# Patient Record
Sex: Female | Born: 1975 | Race: Black or African American | Hispanic: No | Smoking: Never smoker
Health system: Southern US, Community
[De-identification: ages and names within clinical notes are randomized; demographics above are authoritative.]

## PROBLEM LIST (undated history)

## (undated) DIAGNOSIS — I10 Essential (primary) hypertension: Secondary | ICD-10-CM

## (undated) DIAGNOSIS — I639 Cerebral infarction, unspecified: Secondary | ICD-10-CM

## (undated) DIAGNOSIS — E049 Nontoxic goiter, unspecified: Secondary | ICD-10-CM

## (undated) DIAGNOSIS — M545 Low back pain, unspecified: Secondary | ICD-10-CM

## (undated) DIAGNOSIS — D649 Anemia, unspecified: Secondary | ICD-10-CM

## (undated) HISTORY — PX: THYROIDECTOMY: SHX17

---

## 2019-10-09 ENCOUNTER — Ambulatory Visit
Admission: EM | Admit: 2019-10-09 | Discharge: 2019-10-09 | Disposition: A | Discharge status: Home / Self Care | Payer: Medicaid Other | Attending: Family Medicine | Admitting: Family Medicine

## 2019-10-09 ENCOUNTER — Other Ambulatory Visit: Payer: Self-pay

## 2019-10-09 ENCOUNTER — Encounter: Payer: Self-pay | Admitting: Emergency Medicine

## 2019-10-09 DIAGNOSIS — H11431 Conjunctival hyperemia, right eye: Secondary | ICD-10-CM

## 2019-10-09 HISTORY — DX: Essential (primary) hypertension: I10

## 2019-10-09 HISTORY — DX: Cerebral infarction, unspecified: I63.9

## 2019-10-09 MED ORDER — OLOPATADINE HCL 0.1 % OP SOLN: 1.0000 [drp] | Freq: Two times a day (BID) | OPHTHALMIC | 0 refills | Status: DC

## 2019-10-09 MED ORDER — POLYMYXIN B-TRIMETHOPRIM 10000-0.1 UNIT/ML-% OP SOLN: 1.0000 [drp] | Freq: Four times a day (QID) | OPHTHALMIC | 0 refills | Status: AC

## 2019-10-09 NOTE — ED Provider Notes (Signed)
[facility]    CSN: [id number] Arrival date & time: 10/09/19  1128      History   Chief Complaint Chief Complaint  Patient presents with  . Eye Problem   HPI  44 year old female presents with the above complaint.  Patient reports that this started on Wednesday.  She reports an area of redness of her left eye.  She reports intermittent watering.  Some mild itching.  No significant pain.  No vision changes.  No relieving factors.  No known inciting factor.  No known exacerbating factors.  No other complaints.  Past Medical History:  Diagnosis Date  . Hypertension   . Stroke [facility])     Past Surgical History:  Procedure Laterality Date  . CESAREAN SECTION    . THYROIDECTOMY      OB History   No obstetric history on file.      Home Medications    Prior to Admission medications   Medication Sig Start Date End Date Taking? Authorizing Provider  aspirin 81 MG EC tablet Take by mouth.   Yes [provider]  ergocalciferol (VITAMIN D2) 1.25 MG (50000 UT) capsule Take by mouth.   Yes [provider]  metoprolol succinate (TOPROL-XL) 50 MG 24 hr tablet SMARTSIG:1 Tablet(s) By Mouth Twice Daily 08/28/19  Yes [provider]  omeprazole (PRILOSEC) 20 MG capsule Take by mouth.   Yes [provider]  spironolactone (ALDACTONE) 50 MG tablet Take 50 mg by mouth daily. 09/29/19  Yes [provider]  olopatadine (PATADAY) 0.1 % ophthalmic solution Place 1 drop into the right eye 2 (two) times daily. 10/09/19   Coral Spikes, DO  trimethoprim-polymyxin b (POLYTRIM) ophthalmic solution Place 1 drop into both eyes every 6 (six) hours for 5 days. 10/09/19 10/14/19  Coral Spikes, DO    Family History Family History  Problem Relation Age of Onset  . Healthy Mother     Social History Social History   Tobacco Use  . Smoking status: Never Smoker  . Smokeless tobacco: Never Used  Vaping Use  . Vaping Use: Never used  Substance Use  Topics  . Alcohol use: Not Currently  . Drug use: Not Currently     Allergies   Patient has no known allergies.   Review of Systems Review of Systems  Constitutional: Negative.   Eyes: Positive for redness and itching.   Physical Exam Triage Vital Signs ED Triage Vitals  Enc Vitals Group     BP 10/09/19 1213 (!) 175/100     Pulse Rate 10/09/19 1213 79     Resp 10/09/19 1213 18     Temp 10/09/19 1213 98.5 F (36.9 C)     Temp Source 10/09/19 1213 Oral     SpO2 10/09/19 1213 98 %     Weight 10/09/19 1207 250 lb (113.4 kg)     Height 10/09/19 1207 5\' 5"  (1.651 m)     Head Circumference --      Peak Flow --      Pain Score 10/09/19 1207 0     Pain Loc --      Pain Edu? --      Excl. in [place]? --    No data found.  Updated Vital Signs BP (!) 175/100 (BP Location: Right Arm)   Pulse 79   Temp 98.5 F (36.9 C) (Oral)   Resp 18   Ht 5\' 5"  (1.651 m)   Wt 113.4 kg   LMP 10/03/2019 (Approximate)  SpO2 98%   BMI 41.60 kg/m   Visual Acuity Right Eye Distance:   Left Eye Distance:   Bilateral Distance:    Right Eye Near:   Left Eye Near:    Bilateral Near:     Physical Exam Constitutional:      General: She is not in acute distress.    Appearance: Normal appearance. She is obese. She is not ill-appearing.  HENT:     Head: Normocephalic and atraumatic.  Eyes:     Pupils: Pupils are equal, round, and reactive to light.      Comments: Mild conjunctival injection at the level location.  Cardiovascular:     Rate and Rhythm: Normal rate and regular rhythm.  Pulmonary:     Effort: Pulmonary effort is normal. No respiratory distress.  Neurological:     Mental Status: She is alert.  Psychiatric:        Mood and Affect: Mood normal.        Behavior: Behavior normal.    UC Treatments / Results  Labs (all labs ordered are listed, but only abnormal results are displayed) Labs Reviewed - No data to display  EKG   Radiology No results  found.  Procedures Procedures (including critical care time)  Medications Ordered in UC Medications - No data to display  Initial Impression / Assessment and Plan / UC Course  I have reviewed the triage vital signs and the nursing notes.  Pertinent labs & imaging results that were available during my care of the patient were reviewed by me and considered in my medical decision making (see chart for details).    44 year old female presents with conjunctival injection.  Does not appear to be consistent with subconjunctival hemorrhage.  Placing empirically on Polytrim and Pataday.  Final Clinical Impressions(s) / UC Diagnoses   Final diagnoses:  Conjunctival hyperemia of right eye     Discharge Instructions     Medication as prescribed.  If persists, see [facility].  Take care  Dr. Adriana Simas    ED Prescriptions    Medication Sig Dispense Auth. Provider   olopatadine (PATADAY) 0.1 % ophthalmic solution Place 1 drop into the right eye 2 (two) times daily. 5 mL Tajah Noguchi G, DO   trimethoprim-polymyxin b (POLYTRIM) ophthalmic solution Place 1 drop into both eyes every 6 (six) hours for 5 days. 10 mL Tommie Sams, DO     PDMP not reviewed this encounter.   Tommie Sams, [place] 10/09/19 2136

## 2019-10-09 NOTE — ED Triage Notes (Signed)
Pt c/o left eye redness, watery and mild itching. Started about a week ago but gotten worse. Denies pain.

## 2019-10-09 NOTE — Discharge Instructions (Addendum)
Medication as prescribed.  If persists, see [facility].  Take care  Dr. Adriana Simas

## 2019-12-18 ENCOUNTER — Ambulatory Visit
Admission: EM | Admit: 2019-12-18 | Discharge: 2019-12-18 | Disposition: A | Discharge status: Home / Self Care | Payer: Medicaid Other | Attending: Internal Medicine | Admitting: Internal Medicine

## 2019-12-18 ENCOUNTER — Other Ambulatory Visit: Payer: Self-pay

## 2019-12-18 DIAGNOSIS — Z7982 Long term (current) use of aspirin: Secondary | ICD-10-CM | POA: Insufficient documentation

## 2019-12-18 DIAGNOSIS — R05 Cough: Secondary | ICD-10-CM | POA: Insufficient documentation

## 2019-12-18 DIAGNOSIS — I10 Essential (primary) hypertension: Secondary | ICD-10-CM | POA: Insufficient documentation

## 2019-12-18 DIAGNOSIS — Z7901 Long term (current) use of anticoagulants: Secondary | ICD-10-CM | POA: Diagnosis not present

## 2019-12-18 DIAGNOSIS — B9789 Other viral agents as the cause of diseases classified elsewhere: Secondary | ICD-10-CM

## 2019-12-18 DIAGNOSIS — Z8673 Personal history of transient ischemic attack (TIA), and cerebral infarction without residual deficits: Secondary | ICD-10-CM | POA: Insufficient documentation

## 2019-12-18 DIAGNOSIS — Z20822 Contact with and (suspected) exposure to covid-19: Secondary | ICD-10-CM | POA: Insufficient documentation

## 2019-12-18 DIAGNOSIS — J019 Acute sinusitis, unspecified: Secondary | ICD-10-CM | POA: Insufficient documentation

## 2019-12-18 DIAGNOSIS — Z79899 Other long term (current) drug therapy: Secondary | ICD-10-CM | POA: Diagnosis not present

## 2019-12-18 MED ORDER — FLUTICASONE PROPIONATE 50 MCG/ACT NA SUSP
1.0000 | Freq: Every day | NASAL | 0 refills | Status: DC
Start: 2019-12-18 — End: 2021-02-16

## 2019-12-18 MED ORDER — GUAIFENESIN ER 600 MG PO TB12: 600.0000 mg | ORAL_TABLET | Freq: Two times a day (BID) | ORAL | 0 refills | Status: AC

## 2019-12-18 MED ORDER — AMOXICILLIN 500 MG PO CAPS: 500.0000 mg | ORAL_CAPSULE | Freq: Three times a day (TID) | ORAL | 0 refills | Status: AC

## 2019-12-18 MED ORDER — CETIRIZINE HCL 10 MG PO TABS
10.0000 mg | ORAL_TABLET | Freq: Every day | ORAL | 0 refills | Status: DC
Start: 2019-12-18 — End: 2021-02-21

## 2019-12-18 NOTE — ED Triage Notes (Signed)
Pt with cough for one week productive of yellow-green phlegm. No nasal drainage. No headache or body aches. Works in [employer], [employer] and wants COVID test

## 2019-12-19 LAB — SARS CORONAVIRUS 2 (TAT 6-24 HRS): SARS Coronavirus 2: NEGATIVE

## 2019-12-19 NOTE — ED Provider Notes (Signed)
[facility]    CSN: [id number] Arrival date & time: 12/18/19  1235      History   Chief Complaint Chief Complaint  Patient presents with  . Cough    HPI [name] is a 44 y.o. female comes to the urgent care with complaints of a cough productive of yellowish-green sputum of 1 week duration.  Patient says that his symptoms started a week ago and has been persistent.  She complains of nasal congestion and nasal discharge for the same color.  Discharge is thick in consistency.  No shortness of breath, fever or chills.  Patient is not vaccinated against COVID-19 virus.  No chest pain or chest pressure.  She denies any postnasal drip.  No nausea, vomiting no abdominal pain.  No sick contacts.   HPI  Past Medical History:  Diagnosis Date  . Hypertension   . Stroke [facility])     There are no problems to display for this patient.   Past Surgical History:  Procedure Laterality Date  . CESAREAN SECTION    . THYROIDECTOMY      OB History   No obstetric history on file.      Home Medications    Prior to Admission medications   Medication Sig Start Date End Date Taking? Authorizing Provider  amoxicillin (AMOXIL) 500 MG capsule Take 1 capsule (500 mg total) by mouth 3 (three) times daily for 5 days. Please do not fill before 9/9 12/21/19 12/26/19  Naviyah Schaffert, Britta Mccreedy, MD  aspirin 81 MG EC tablet Take by mouth.    [provider]  cetirizine (ZYRTEC ALLERGY) 10 MG tablet Take 1 tablet (10 mg total) by mouth daily. 12/18/19   Nyzir Dubois, Britta Mccreedy, MD  ergocalciferol (VITAMIN D2) 1.25 MG (50000 UT) capsule Take by mouth.    [provider]  fluticasone (FLONASE) 50 MCG/ACT nasal spray Place 1 spray into both nostrils daily for 14 days. 12/18/19 01/01/20  Merrilee Jansky, MD  guaiFENesin (MUCINEX) 600 MG 12 hr tablet Take 1 tablet (600 mg total) by mouth 2 (two) times daily for 14 days. 12/18/19 01/01/20  Merrilee Jansky, MD  metoprolol succinate  (TOPROL-XL) 50 MG 24 hr tablet SMARTSIG:1 Tablet(s) By Mouth Twice Daily 08/28/19   [provider]  omeprazole (PRILOSEC) 20 MG capsule Take by mouth.    [provider]  spironolactone (ALDACTONE) 50 MG tablet Take 50 mg by mouth daily. 09/29/19   [provider]    Family History Family History  Problem Relation Age of Onset  . Healthy Mother     Social History Social History   Tobacco Use  . Smoking status: Never Smoker  . Smokeless tobacco: Never Used  Vaping Use  . Vaping Use: Never used  Substance Use Topics  . Alcohol use: Not Currently  . Drug use: Not Currently     Allergies   Patient has no known allergies.   Review of Systems Review of Systems  Constitutional: Negative.  Negative for chills, fatigue and fever.  HENT: Positive for congestion, postnasal drip, sinus pressure and sinus pain. Negative for rhinorrhea, sore throat and voice change.   Respiratory: Positive for cough. Negative for shortness of breath and wheezing.   Cardiovascular: Negative for chest pain.  Genitourinary: Negative.   Musculoskeletal: Negative for arthralgias, joint swelling and myalgias.  Neurological: Negative.      Physical Exam Triage Vital Signs ED Triage Vitals  Enc Vitals Group     BP 12/18/19 1248 Marland Kitchen)  185/94     Pulse Rate 12/18/19 1248 83     Resp 12/18/19 1248 19     Temp 12/18/19 1248 98.9 F (37.2 C)     Temp Source 12/18/19 1248 Oral     SpO2 12/18/19 1248 100 %     Weight 12/18/19 1247 250 lb (113.4 kg)     Height 12/18/19 1247 5\' 5"  (1.651 m)     Head Circumference --      Peak Flow --      Pain Score 12/18/19 1247 0     Pain Loc --      Pain Edu? --      Excl. in [place]? --    No data found.  Updated Vital Signs BP (!) 185/94 (BP Location: Left Arm)   Pulse 83   Temp 98.9 F (37.2 C) (Oral)   Resp 19   Ht 5\' 5"  (1.651 m)   Wt 113.4 kg   LMP 12/04/2019   SpO2 100%   BMI 41.60 kg/m   Visual Acuity Right Eye Distance:     Left Eye Distance:   Bilateral Distance:    Right Eye Near:   Left Eye Near:    Bilateral Near:     Physical Exam Vitals and nursing note reviewed.  Constitutional:      General: She is not in acute distress.    Appearance: She is not ill-appearing.  HENT:     Right Ear: Tympanic membrane normal.     Left Ear: Tympanic membrane normal.     Nose: No rhinorrhea.     Mouth/Throat:     Pharynx: No posterior oropharyngeal erythema.  Cardiovascular:     Rate and Rhythm: Normal rate and regular rhythm.     Pulses: Normal pulses.  Pulmonary:     Effort: Pulmonary effort is normal.  Musculoskeletal:     Cervical back: Normal range of motion and neck supple. No rigidity.  Lymphadenopathy:     Cervical: No cervical adenopathy.  Neurological:     Mental Status: She is alert.      UC Treatments / Results  Labs (all labs ordered are listed, but only abnormal results are displayed) Labs Reviewed  SARS CORONAVIRUS 2 (TAT 6-24 HRS)    EKG   Radiology No results found.  Procedures Procedures (including critical care time)  Medications Ordered in UC Medications - No data to display  Initial Impression / Assessment and Plan / UC Course  I have reviewed the triage vital signs and the nursing notes.  Pertinent labs & imaging results that were available during my care of the patient were reviewed by me and considered in my medical decision making (see chart for details).     1.  Acute viral sinusitis, currently day #7-8: Humibid twice daily Zyrtec 10 mg daily Flonase Saline nasal spray If the patient's symptoms continue to worsen over the next couple of days she is advised to pick up a prescription for amoxicillin for treatment of superimposed bacterial sinusitis. Return precautions given. Final Clinical Impressions(s) / UC Diagnoses   Final diagnoses:  Acute viral sinusitis   Discharge Instructions   None    ED Prescriptions    Medication Sig Dispense Auth.  Provider   guaiFENesin (MUCINEX) 600 MG 12 hr tablet Take 1 tablet (600 mg total) by mouth 2 (two) times daily for 14 days. 28 tablet Arminda Foglio, , MD   cetirizine (ZYRTEC ALLERGY) 10 MG tablet Take 1 tablet (10 mg total) by  mouth daily. 30 tablet Selda Jalbert, Britta Mccreedy, MD   amoxicillin (AMOXIL) 500 MG capsule Take 1 capsule (500 mg total) by mouth 3 (three) times daily for 5 days. Please do not fill before 9/9 15 capsule Jermiah Howton, Britta Mccreedy, MD   fluticasone (FLONASE) 50 MCG/ACT nasal spray Place 1 spray into both nostrils daily for 14 days. 16 g Debroh Sieloff, Britta Mccreedy, MD     PDMP not reviewed this encounter.   Merrilee Jansky, MD 12/19/19 1225

## 2020-09-29 ENCOUNTER — Observation Stay: Payer: Medicaid Other

## 2020-09-29 ENCOUNTER — Emergency Department: Payer: Medicaid Other

## 2020-09-29 ENCOUNTER — Other Ambulatory Visit: Payer: Self-pay

## 2020-09-29 ENCOUNTER — Observation Stay
Admission: EM | Admit: 2020-09-29 | Discharge: 2020-09-30 | Disposition: A | Discharge status: Home / Self Care | Payer: Medicaid Other | Attending: Internal Medicine | Admitting: Internal Medicine

## 2020-09-29 DIAGNOSIS — R531 Weakness: Secondary | ICD-10-CM

## 2020-09-29 DIAGNOSIS — Z28310 Unvaccinated for covid-19: Secondary | ICD-10-CM | POA: Diagnosis not present

## 2020-09-29 DIAGNOSIS — I160 Hypertensive urgency: Secondary | ICD-10-CM | POA: Diagnosis not present

## 2020-09-29 DIAGNOSIS — E782 Mixed hyperlipidemia: Secondary | ICD-10-CM

## 2020-09-29 DIAGNOSIS — Z20822 Contact with and (suspected) exposure to covid-19: Secondary | ICD-10-CM | POA: Insufficient documentation

## 2020-09-29 DIAGNOSIS — I6381 Other cerebral infarction due to occlusion or stenosis of small artery: Secondary | ICD-10-CM

## 2020-09-29 DIAGNOSIS — Z8673 Personal history of transient ischemic attack (TIA), and cerebral infarction without residual deficits: Secondary | ICD-10-CM | POA: Insufficient documentation

## 2020-09-29 DIAGNOSIS — Z7982 Long term (current) use of aspirin: Secondary | ICD-10-CM | POA: Insufficient documentation

## 2020-09-29 DIAGNOSIS — Z79899 Other long term (current) drug therapy: Secondary | ICD-10-CM | POA: Insufficient documentation

## 2020-09-29 DIAGNOSIS — I10 Essential (primary) hypertension: Secondary | ICD-10-CM | POA: Insufficient documentation

## 2020-09-29 DIAGNOSIS — E66813 Obesity, class 3: Secondary | ICD-10-CM

## 2020-09-29 LAB — DIFFERENTIAL
Abs Immature Granulocytes: 0.02 10*3/uL (ref 0.00–0.07)
Basophils Absolute: 0 10*3/uL (ref 0.0–0.1)
Basophils Relative: 0 %
Eosinophils Absolute: 0 10*3/uL (ref 0.0–0.5)
Eosinophils Relative: 1 %
Immature Granulocytes: 0 %
Lymphocytes Relative: 33 %
Lymphs Abs: 1.6 10*3/uL (ref 0.7–4.0)
Monocytes Absolute: 0.3 10*3/uL (ref 0.1–1.0)
Monocytes Relative: 6 %
Neutro Abs: 2.8 10*3/uL (ref 1.7–7.7)
Neutrophils Relative %: 60 %

## 2020-09-29 LAB — CBC
HCT: 35.2 % — ABNORMAL LOW (ref 36.0–46.0)
Hemoglobin: 11 g/dL — ABNORMAL LOW (ref 12.0–15.0)
MCH: 24.3 pg — ABNORMAL LOW (ref 26.0–34.0)
MCHC: 31.3 g/dL (ref 30.0–36.0)
MCV: 77.9 fL — ABNORMAL LOW (ref 80.0–100.0)
Platelets: 322 10*3/uL (ref 150–400)
RBC: 4.52 MIL/uL (ref 3.87–5.11)
RDW: 17.4 % — ABNORMAL HIGH (ref 11.5–15.5)
WBC: 4.8 10*3/uL (ref 4.0–10.5)
nRBC: 0 % (ref 0.0–0.2)

## 2020-09-29 LAB — COMPREHENSIVE METABOLIC PANEL
ALT: 11 U/L (ref 0–44)
AST: 17 U/L (ref 15–41)
Albumin: 3.6 g/dL (ref 3.5–5.0)
Alkaline Phosphatase: 52 U/L (ref 38–126)
Anion gap: 7 (ref 5–15)
BUN: 18 mg/dL (ref 6–20)
CO2: 29 mmol/L (ref 22–32)
Calcium: 9 mg/dL (ref 8.9–10.3)
Chloride: 103 mmol/L (ref 98–111)
Creatinine, Ser: 1.18 mg/dL — ABNORMAL HIGH (ref 0.44–1.00)
GFR, Estimated: 59 mL/min — ABNORMAL LOW (ref >60)
Glucose, Bld: 111 mg/dL — ABNORMAL HIGH (ref 70–99)
Potassium: 4.1 mmol/L (ref 3.5–5.1)
Sodium: 139 mmol/L (ref 135–145)
Total Bilirubin: 0.5 mg/dL (ref 0.3–1.2)
Total Protein: 7.9 g/dL (ref 6.5–8.1)

## 2020-09-29 LAB — RESP PANEL BY RT-PCR (FLU A&B, COVID) ARPGX2
Influenza A by PCR: NEGATIVE
Influenza B by PCR: NEGATIVE
SARS Coronavirus 2 by RT PCR: NEGATIVE

## 2020-09-29 LAB — PROTIME-INR
INR: 0.9 (ref 0.8–1.2)
Prothrombin Time: 12.6 seconds (ref 11.4–15.2)

## 2020-09-29 LAB — APTT: aPTT: 29 seconds (ref 24–36)

## 2020-09-29 MED ORDER — LABETALOL HCL 5 MG/ML IV SOLN
5.0000 mg | INTRAVENOUS | Status: DC | PRN
  Filled 2020-09-29: qty 4

## 2020-09-29 MED ORDER — ONDANSETRON HCL 4 MG PO TABS: 4.0000 mg | ORAL_TABLET | Freq: Four times a day (QID) | ORAL | Status: DC | PRN

## 2020-09-29 MED ORDER — SODIUM CHLORIDE 0.9% FLUSH: 3.0000 mL | Freq: Once | INTRAVENOUS | Status: DC

## 2020-09-29 MED ORDER — ACETAMINOPHEN 650 MG RE SUPP: 650.0000 mg | Freq: Four times a day (QID) | RECTAL | Status: DC | PRN

## 2020-09-29 MED ORDER — HYDRALAZINE HCL 20 MG/ML IJ SOLN
5.0000 mg | Freq: Once | INTRAMUSCULAR | Status: AC
  Administered 2020-09-29: 5 mg via INTRAVENOUS
  Filled 2020-09-29: qty 1

## 2020-09-29 MED ORDER — PANTOPRAZOLE SODIUM 40 MG PO TBEC
80.0000 mg | DELAYED_RELEASE_TABLET | Freq: Every day | ORAL | Status: DC
  Administered 2020-09-29 – 2020-09-30 (×2): 80 mg via ORAL
  Filled 2020-09-29 (×3): qty 2

## 2020-09-29 MED ORDER — IOHEXOL 350 MG/ML SOLN
75.0000 mL | Freq: Once | INTRAVENOUS | Status: AC | PRN
  Administered 2020-09-29: 75 mL via INTRAVENOUS

## 2020-09-29 MED ORDER — ACETAMINOPHEN 325 MG PO TABS: 650.0000 mg | ORAL_TABLET | Freq: Four times a day (QID) | ORAL | Status: DC | PRN

## 2020-09-29 MED ORDER — LISINOPRIL 20 MG PO TABS
40.0000 mg | ORAL_TABLET | Freq: Every day | ORAL | Status: DC
  Administered 2020-09-29 – 2020-09-30 (×2): 40 mg via ORAL
  Filled 2020-09-29: qty 4
  Filled 2020-09-29: qty 2

## 2020-09-29 MED ORDER — AMLODIPINE BESYLATE 10 MG PO TABS
10.0000 mg | ORAL_TABLET | Freq: Every day | ORAL | Status: DC
  Administered 2020-09-29 – 2020-09-30 (×2): 10 mg via ORAL
  Filled 2020-09-29: qty 1
  Filled 2020-09-29: qty 2

## 2020-09-29 MED ORDER — ONDANSETRON HCL 4 MG/2ML IJ SOLN: 4.0000 mg | Freq: Four times a day (QID) | INTRAMUSCULAR | Status: DC | PRN

## 2020-09-29 MED ORDER — VITAMIN D (ERGOCALCIFEROL) 1.25 MG (50000 UNIT) PO CAPS: 50000.0000 [IU] | ORAL_CAPSULE | ORAL | Status: DC

## 2020-09-29 MED ORDER — IOHEXOL 350 MG/ML SOLN
100.0000 mL | Freq: Once | INTRAVENOUS | Status: AC | PRN
  Administered 2020-09-29: 100 mL via INTRAVENOUS

## 2020-09-29 MED ORDER — METOPROLOL SUCCINATE ER 50 MG PO TB24
50.0000 mg | ORAL_TABLET | Freq: Two times a day (BID) | ORAL | Status: DC
  Administered 2020-09-29 – 2020-09-30 (×2): 50 mg via ORAL
  Filled 2020-09-29 (×2): qty 1

## 2020-09-29 MED ORDER — HYDRALAZINE HCL 20 MG/ML IJ SOLN
5.0000 mg | Freq: Once | INTRAMUSCULAR | Status: AC
  Administered 2020-09-29: 5 mg via INTRAVENOUS

## 2020-09-29 MED ORDER — ASPIRIN 325 MG PO TABS
325.0000 mg | ORAL_TABLET | Freq: Every day | ORAL | Status: DC
  Administered 2020-09-30: 325 mg via ORAL
  Filled 2020-09-29 (×2): qty 1

## 2020-09-29 MED ORDER — SPIRONOLACTONE 25 MG PO TABS
50.0000 mg | ORAL_TABLET | Freq: Every day | ORAL | Status: DC
  Administered 2020-09-30: 50 mg via ORAL
  Filled 2020-09-29: qty 2

## 2020-09-29 MED ORDER — HYDROCHLOROTHIAZIDE 25 MG PO TABS
25.0000 mg | ORAL_TABLET | Freq: Every day | ORAL | Status: DC
  Administered 2020-09-29 – 2020-09-30 (×2): 25 mg via ORAL
  Filled 2020-09-29 (×2): qty 1

## 2020-09-29 MED ORDER — ENOXAPARIN SODIUM 40 MG/0.4ML IJ SOSY: 40.0000 mg | PREFILLED_SYRINGE | INTRAMUSCULAR | Status: DC

## 2020-09-29 MED ORDER — HYDRALAZINE HCL 20 MG/ML IJ SOLN: 10.0000 mg | Freq: Four times a day (QID) | INTRAMUSCULAR | Status: DC | PRN

## 2020-09-29 NOTE — ED Notes (Signed)
Dr. Cox at bedside.  

## 2020-09-29 NOTE — ED Notes (Signed)
CT at bedside to take patient for CT Angio, Neuro at bedside refused transport until patient has 18G IV in place, CT Tech states Angio can be run with 20G, RN at bedside for 18G placement.

## 2020-09-29 NOTE — ED Notes (Signed)
Pt requesting medicine for acid reflux. Pt administered ordered protonix at this time.

## 2020-09-29 NOTE — ED Notes (Signed)
Pt ambulating to restroom with walker at this time.

## 2020-09-29 NOTE — ED Provider Notes (Signed)
[facility] Emergency Department Provider Note   ____________________________________________   Event Date/Time   First MD Initiated Contact with Patient 09/29/20 1326     (approximate)  I have reviewed the triage vital signs and the nursing notes.   HISTORY  Chief Complaint Altered Mental Status and Speech Problem    HPI [name] is a 45 y.o. female with past medical history of hypertension and stroke who presents to the ED for weakness.  Patient reports that around 7:00 last night she noticed that her speech was more slurred than usual and that she felt "foggy headed."  This was associated with increasing numbness and weakness in her right arm and her right leg.  She has not noticed any facial droop or visual changes.  Symptoms persisted when she woke up this morning and so she decided to seek care in the ED.  She was admitted to [facility] for a stroke last month but states her symptoms then caused her to feel dizzy and off-balance.  She denies any history of right-sided weakness and states she has been compliant with her blood pressure medication since discharge from [facility].  She denies any fevers, cough, chest pain, shortness of breath, abdominal pain, vomiting, or diarrhea.        Past Medical History:  Diagnosis Date   Hypertension    Stroke [facility])     There are no problems to display for this patient.   Past Surgical History:  Procedure Laterality Date   CESAREAN SECTION     THYROIDECTOMY      Prior to Admission medications   Medication Sig Start Date End Date Taking? Authorizing Provider  aspirin 81 MG EC tablet Take by mouth.    [provider]  cetirizine (ZYRTEC ALLERGY) 10 MG tablet Take 1 tablet (10 mg total) by mouth daily. 12/18/19   Lamptey, Britta Mccreedy, MD  ergocalciferol (VITAMIN D2) 1.25 MG (50000 UT) capsule Take by mouth.    [provider]  fluticasone (FLONASE) 50 MCG/ACT nasal spray Place 1 spray into  both nostrils daily for 14 days. 12/18/19 01/01/20  Merrilee Jansky, MD  metoprolol succinate (TOPROL-XL) 50 MG 24 hr tablet SMARTSIG:1 Tablet(s) By Mouth Twice Daily 08/28/19   [provider]  omeprazole (PRILOSEC) 20 MG capsule Take by mouth.    [provider]  spironolactone (ALDACTONE) 50 MG tablet Take 50 mg by mouth daily. 09/29/19   [provider]    Allergies Patient has no known allergies.  Family History  Problem Relation Age of Onset   Healthy Mother     Social History Social History   Tobacco Use   Smoking status: Never   Smokeless tobacco: Never  Vaping Use   Vaping Use: Never used  Substance Use Topics   Alcohol use: Yes   Drug use: Not Currently    Review of Systems  Constitutional: No fever/chills Eyes: No visual changes. ENT: No sore throat. Cardiovascular: Denies chest pain. Respiratory: Denies shortness of breath. Gastrointestinal: No abdominal pain.  No nausea, no vomiting.  No diarrhea.  No constipation. Genitourinary: Negative for dysuria. Musculoskeletal: Negative for back pain. Skin: Negative for rash. Neurological: Negative for headaches, positive for focal weakness and numbness.  Positive for speech changes.  ____________________________________________   PHYSICAL EXAM:  VITAL SIGNS: ED Triage Vitals  Enc Vitals Group     BP 09/29/20 1129 (!) 228/110     Pulse Rate 09/29/20 1129 67     Resp 09/29/20  1129 18     Temp 09/29/20 1129 98.4 F (36.9 C)     Temp Source 09/29/20 1129 Oral     SpO2 09/29/20 1129 100 %     Weight 09/29/20 1126 265 lb (120.2 kg)     Height 09/29/20 1126 5\' 5"  (1.651 m)     Head Circumference --      Peak Flow --      Pain Score 09/29/20 1124 0     Pain Loc --      Pain Edu? --      Excl. in [place]? --     Constitutional: Alert and oriented. Eyes: Conjunctivae are normal. Head: Atraumatic. Nose: No congestion/rhinnorhea. Mouth/Throat: Mucous membranes are moist. Neck: Normal  ROM Cardiovascular: Normal rate, regular rhythm. Grossly normal heart sounds.  2+ radial pulses bilaterally. Respiratory: Normal respiratory effort.  No retractions. Lungs CTAB. Gastrointestinal: Soft and nontender. No distention. Genitourinary: deferred Musculoskeletal: No lower extremity tenderness nor edema. Neurologic: Slowed speech pattern without aphasia noted.  4 out of 5 strength in right upper and right lower extremities, 5 out of 5 strength in left upper and lower extremities. Skin:  Skin is warm, dry and intact. No rash noted. Psychiatric: Mood and affect are normal. Speech and behavior are normal.  ____________________________________________   LABS (all labs ordered are listed, but only abnormal results are displayed)  Labs Reviewed  CBC - Abnormal; Notable for the following components:      Result Value   Hemoglobin 11.0 (*)    HCT 35.2 (*)    MCV 77.9 (*)    MCH 24.3 (*)    RDW 17.4 (*)    All other components within normal limits  COMPREHENSIVE METABOLIC PANEL - Abnormal; Notable for the following components:   Glucose, Bld 111 (*)    Creatinine, Ser 1.18 (*)    GFR, Estimated 59 (*)    All other components within normal limits  PROTIME-INR  APTT  DIFFERENTIAL  CBG MONITORING, ED  POC URINE PREG, ED   ____________________________________________  EKG  ED ECG REPORT I, 10/01/20, the attending physician, personally viewed and interpreted this ECG.   Date: 09/29/2020  EKG Time: 11:39  Rate: 66  Rhythm: normal sinus rhythm  Axis: LAD  Intervals:none  ST&T Change: T wave inversions laterally, possible LVH   PROCEDURES  Procedure(s) performed (including Critical Care):  Procedures   ____________________________________________   INITIAL IMPRESSION / ASSESSMENT AND PLAN / ED COURSE      45 year old female with past medical history of hypertension and stroke who presents to the ED for right-sided weakness and speech difficulties since 7  PM last night.  Patient is outside the window for acute intervention given onset of symptoms was greater than 12 hours ago.  CT head reviewed by me and shows no obvious hemorrhage, patient noted to have multiple chronic infarcts per radiology but no apparent acute infarct.  Findings discussed with Dr. 55 of neurology, who will evaluate the patient but for now recommends proceeding with CTA of head and neck along with MRI without contrast to assess for acute stroke.  Neurology recommends holding off on medication for blood pressure management given concern for acute stroke and need for permissive hypertension.  Labs unremarkable thus far.  Patient turned over to oncoming provider pending CTA and MRI results.      ____________________________________________   FINAL CLINICAL IMPRESSION(S) / ED DIAGNOSES  Final diagnoses:  Right sided weakness     ED Discharge Orders  None        Note:  This document was prepared using Dragon voice recognition software and may include unintentional dictation errors.    Chesley Noon, MD 09/29/20 1535

## 2020-09-29 NOTE — ED Notes (Signed)
Requested dinner tray for patient per request.

## 2020-09-29 NOTE — H&P (Signed)
History and Physical   [name] IFO:[id number] DOB: 07/04/1975 DOA: 09/29/2020  PCP: Fanny Dance, FNP  Outpatient Specialists: Dr. Jacqlyn Larsen, Neurology Patient coming from: home  I have personally briefly reviewed patient's old medical records in [facility] EMR.  Chief Concern: right lower extremity weakness  HPI: [name] is a 45 y.o. female with medical history significant for hypertension, hyperlipidemia, morbid obesity, glucose intolerance, current marijuana use, history of multiple CVAs, residual cognitive difficulty, mild aphasia, intermittent difficulty with left visual fields, former tobacco user, presents to the emergency department for chief concerns of sudden onset of right lower extremity weakness.  On 09/28/2020, she was at a cookout with her family in the afternoon when her cousin noticed that she kept calling out the wrong name.  In the same evening she endorses right arm and leg stiffness and weakness.  She reports that the symptoms are new.  She reports that the symptoms are persistent at this time.  Of note, she was extensively worked up for hypercoagulable state by her neurologist in 2021 which was ultimately negative.  She denies history of miscarriages or lupus.  Social history: She lives at home, she has 3 children.  She denies tobacco use, EtOH.  She occasionally smokes THC.  She is currently working as a Biomedical engineer.  Vaccination: She is not vaccinated for COVID-19  ROS: Constitutional: no weight change, no fever ENT/Mouth: no sore throat, no rhinorrhea Eyes: no eye pain, no vision changes Cardiovascular: no chest pain, no dyspnea,  no edema, no palpitations Respiratory: no cough, no sputum, no wheezing Gastrointestinal: no nausea, no vomiting, no diarrhea, no constipation Genitourinary: no urinary incontinence, no dysuria, no hematuria Musculoskeletal: no arthralgias, no myalgias Skin: no skin lesions, no pruritus, Neuro: +  weakness, no loss of consciousness, no syncope Psych: no anxiety, no depression, no decrease appetite Heme/Lymph: no bruising, no bleeding  ED Course: Discussed with ED provider, patient requiring hospitalization for hypertensive urgency.  Labs in the emergency department was remarkable for temperature of 98.4, respiration rate of 18, heart rate of 67, blood pressure of 228/110, SPO2 of 100% on room air.  Patient was given hydralazine 5 mg IV x2 by EDP with improvement of blood pressure to 207/103.  Assessment/Plan  Principal Problem:   Hypertensive urgency Active Problems:   History of ischemic left MCA stroke   Basal ganglia stroke (HCC)   Essential hypertension   Obesity, Class III, BMI 40-49.9 (morbid obesity) (HCC)   Hyperlipidemia, mixed   Hypertensive urgency- -Patient was prescribed amlodipine and hydrochlorothiazide 2 weeks ago however did not pick up these medications - She does not take her metoprolol prior to ED presentation due to the symptoms -Resumed amlodipine 10 mg daily, hydrochlorothiazide 25 mg daily, lisinopril 40 mg daily for day of presentation - Status post hydralazine 5 mg IV per EDP - Spironolactone 50 mg daily resumed for 10/01/2018 2 in the AM  Right-sided upper and lower extremity weakness-CTA of the head and neck - MRI of the brain ordered and read as no acute intracranial abnormality.  Expected evolution of right parietal infarcts from April.  Inferior medial right occipital lobe infarct is stable and remote.  Remote left frontal lobe infarct.  Remote lacunar infarct of the basal ganglia bilaterally.  Chronic microvascular ischemia. - We will follow-up with MRI cervical spine MRI of cervical spine to rule out acute compressive process - Neurology will follow  Hypertension-treat as above  GERD-PPI  COVID PCR/influenza A/influenza B were  negative  Chart reviewed.   DVT prophylaxis: Enoxaparin 40 mg subcutaneous every 24 hours Code Status: Full  code Diet: Heart healthy Family Communication: Updated daughter at bedside Disposition Plan: Pending clinical course Consults called: Neurology Admission status: Progressive cardiac, observation, 24 hours of telemetry ordered  Past Medical History:  Diagnosis Date   Hypertension    Stroke [facility](HCC)    Past Surgical History:  Procedure Laterality Date   CESAREAN SECTION     THYROIDECTOMY     Social History:  reports that she has never smoked. She has never used smokeless tobacco. She reports current alcohol use. She reports previous drug use.  No Known Allergies Family History  Problem Relation Age of Onset   Healthy Mother    Family history: Family history reviewed and not pertinent  Prior to Admission medications   Medication Sig Start Date End Date Taking? Authorizing Provider  aspirin 81 MG EC tablet Take by mouth.    [provider]  cetirizine (ZYRTEC ALLERGY) 10 MG tablet Take 1 tablet (10 mg total) by mouth daily. 12/18/19   Lamptey, Britta MccreedyPhilip O, MD  ergocalciferol (VITAMIN D2) 1.25 MG (50000 UT) capsule Take by mouth.    [provider]  fluticasone (FLONASE) 50 MCG/ACT nasal spray Place 1 spray into both nostrils daily for 14 days. 12/18/19 01/01/20  Merrilee JanskyLamptey, Philip O, MD  metoprolol succinate (TOPROL-XL) 50 MG 24 hr tablet SMARTSIG:1 Tablet(s) By Mouth Twice Daily 08/28/19   [provider]  omeprazole (PRILOSEC) 20 MG capsule Take by mouth.    [provider]  spironolactone (ALDACTONE) 50 MG tablet Take 50 mg by mouth daily. 09/29/19   [provider]   Physical Exam: Vitals:   09/29/20 1129 09/29/20 1535 09/29/20 1600 09/29/20 1642  BP: (!) 228/110 (!) 225/108 (!) 207/103 (!) 180/92  Pulse: 67 72 62 63  Resp: 18 16 16 16   Temp: 98.4 F (36.9 C)     TempSrc: Oral     SpO2: 100% 99% 98% 99%  Weight:      Height:       Constitutional: appears older than chronological age, NAD, calm, comfortable Eyes: PERRL, lids and  conjunctivae normal ENMT: Mucous membranes are moist. Posterior pharynx clear of any exudate or lesions. Age-appropriate dentition. Hearing appropriate Neck: normal, supple, no masses, no thyromegaly Respiratory: clear to auscultation bilaterally, no wheezing, no crackles. Normal respiratory effort. No accessory muscle use.  Cardiovascular: Regular rate and rhythm, no murmurs / rubs / gallops. No extremity edema. 2+ pedal pulses. No carotid bruits.  Abdomen: Obese abdomen, no tenderness, no masses palpated, no hepatosplenomegaly. Bowel sounds positive.  Musculoskeletal: no clubbing / cyanosis. No joint deformity upper and lower extremities. Good ROM, no contractures, no atrophy. Normal muscle tone.  Skin: no rashes, lesions, ulcers. No induration Neurologic: Sensation intact. Strength 5/5 in all 4.  Psychiatric: Normal judgment and insight. Alert and oriented x 3. Normal mood.   EKG: independently reviewed, showing sinus rhythm with rate of 66, QTc 457  Chest x-ray on Admission: I personally reviewed and I agree with radiologist reading as below.  CT Head Wo Contrast  Result Date: 09/29/2020 CLINICAL DATA:  Neuro deficit, acute, stroke suspected. Additional provided: Right-sided weakness and slurred speech which began last night. History of stroke April 2022. EXAM: CT HEAD WITHOUT CONTRAST TECHNIQUE: Contiguous axial images were obtained from the base of the skull through the vertex without intravenous contrast. COMPARISON:  Prior head CT 08/05/2020.  Brain MRI 08/01/2020. FINDINGS: Brain: Redemonstrated  chronic cortical/subcortical infarct within the mid left frontal lobe. Known chronic cortical/subcortical right MCA territory infarcts, most notably within the right parietal lobe and right temporoparietal junction. New from the prior head CT of 08/06/2018 there is a 1.8 cm curvilinear focus of hyperdensity within the right parietal infarction territory, which may reflect interval development of  cortical laminar necrosis or petechial hemorrhage (series 5, image 18) (series 4, images 48-53). Known chronic lacunar infarcts within the left greater than right basal ganglia and left thalamus. Background moderate patchy and ill-defined hypoattenuation within the cerebral white matter, nonspecific but compatible with chronic small vessel ischemic disease. Redemonstrated chronic infarcts within the right cerebellar hemisphere. No acute demarcated cortical infarct is identified. No extra-axial fluid collection. No evidence of intracranial mass. No midline shift. Vascular: No hyperdense vessel. Skull: Normal. Negative for fracture or focal lesion. Sinuses/Orbits: Visualized orbits show no acute finding. No significant paranasal sinus disease at the imaged levels. These results were called by telephone at the time of interpretation on 09/29/2020 at 12:51 pm to provider Delton Prairie, who verbally acknowledged these results. IMPRESSION: No acute demarcated cortical infarction is identified. Redemonstrated chronic cortical/subcortical infarcts within the mid left frontal lobe and right MCA vascular territory. New from the prior head CT of 08/28/2020, there is a 1.8 cm focus of curvilinear hyperdensity within the chronic right parietal lobe infarction territory, which may reflect interval development of cortical laminar necrosis or petechial hemorrhage. Known chronic lacunar infarcts within the bilateral basal ganglia and left thalamus. Stable background moderate cerebral white matter chronic small vessel ischemic disease. Redemonstrated chronic infarcts within the right cerebellum. Electronically Signed   By: Jackey Loge DO   On: 09/29/2020 12:54   MR BRAIN WO CONTRAST  Result Date: 09/29/2020 CLINICAL DATA:  Right-sided weakness and slurred speech beginning last night. Recent infarct April 2022 EXAM: MRI HEAD WITHOUT CONTRAST TECHNIQUE: Multiplanar, multiecho pulse sequences of the brain and surrounding structures  were obtained without intravenous contrast. COMPARISON:  MR head without and with contrast 08/01/2020 at [facility]. CT head without contrast 09/29/2020 FINDINGS: Brain: Expected evolution of right parietal infarcts noted. Remote left frontal lobe infarct noted. Diffuse T2 signal changes associated with the involving right parietal infarcts from April. The inferior medial right cerebellar infarct is stable and remote. Remote blood products noted within the right parietal infarct. No acute hemorrhage is present. Diffuse periventricular T2 signal changes present bilaterally. There is some ex vacuo dilation of the left lateral ventricle related to the volume loss. Remote lacunar infarcts are present in the basal ganglia bilaterally, stable. Subtle asymmetric white matter changes extend into the brainstem on the left, stable. The internal auditory canals are within normal limits. Vascular: Flow is present in the major intracranial arteries. Skull and upper cervical spine: The craniocervical junction is normal. Upper cervical spine is within normal limits. Marrow signal is unremarkable. Sinuses/Orbits: The paranasal sinuses and mastoid air cells are clear. The globes and orbits are within normal limits. IMPRESSION: 1. No acute intracranial abnormality. 2. Inferior medial right occipital lobe infarct is stable and remote. 3. Expected evolution of right parietal infarcts from of April. 4. Remote left frontal lobe infarct. 5. Remote lacunar infarcts of the basal ganglia bilaterally, stable. 6. Diffuse periventricular white matter disease bilaterally is moderately advanced for age. This likely reflects the sequela of chronic microvascular ischemia. Electronically Signed   By: Marin Roberts M.D.   On: 09/29/2020 15:27   CT ANGIO HEAD NECK W WO CM (CODE STROKE)  Result Date: 09/29/2020 CLINICAL DATA:  Slurred speech.  Right-sided weakness. EXAM: CT ANGIOGRAPHY HEAD AND NECK TECHNIQUE: Multidetector CT  imaging of the head and neck was performed using the standard protocol during bolus administration of intravenous contrast. Multiplanar CT image reconstructions and MIPs were obtained to evaluate the vascular anatomy. Carotid stenosis measurements (when applicable) are obtained utilizing NASCET criteria, using the distal internal carotid diameter as the denominator. CONTRAST:  61mL OMNIPAQUE IOHEXOL 350 MG/ML SOLN COMPARISON:  CT head without contrast 09/29/2020. CTA head and neck 08/01/2020 at [facility]. FINDINGS: CTA NECK FINDINGS Aortic arch: Common origin of the left common carotid artery and innominate artery noted. No significant stenosis at the arch. Right carotid system: The right common carotid artery is within normal limits. Minimal atherosclerotic changes present at the bifurcation. Cervical right ICA is within normal limits. Left carotid system: The left common carotid artery is within normal limits. Bifurcation is unremarkable. Cervical left ICA is normal. Vertebral arteries: The left vertebral artery is slightly dominant. Both vertebral arteries originate from the subclavian arteries without significant stenosis. There is no significant stenosis or vascular injury to either vertebral artery in the neck. Skeleton: Vertebral body heights and alignment are normal. No focal lytic or blastic lesions are present. Other neck: Soft tissues the neck are otherwise unremarkable. Upper chest: The lung apices are clear. Thoracic inlet is within normal limits. Right thyroidectomy noted. Review of the MIP images confirms the above findings CTA HEAD FINDINGS Anterior circulation: The internal carotid arteries are within normal limits from the high cervical segments through the ICA termini. The A1 and M1 segments are normal. No definite anterior communicating artery is present. MCA bifurcations are within normal limits. ACA and MCA branch vessels demonstrates some distal segmental narrowing without a  significant proximal stenosis or occlusion. Posterior circulation: The left vertebral artery is the dominant vessel. PICA origins are visualized and within normal limits. Vertebrobasilar junction normal. Basilar artery is within normal limits. The right posterior cerebral artery originates from basilar tip. Left posterior cerebral artery receives equal contribution from a left P1 segment and the posterior communicating artery. The PCA branch vessels are within normal limits bilaterally. Venous sinuses: Dural sinuses are patent. The straight sinus deep cerebral veins are intact. Cortical veins are unremarkable. No vascular malformations are present. Anatomic variants: None Review of the MIP images confirms the above findings IMPRESSION: 1. No emergent large vessel occlusion. 2. Minimal atherosclerotic changes at the right carotid bifurcation without significant stenosis. 3. Mild distal small vessel disease without a significant proximal stenosis, aneurysm, or branch vessel occlusion within the Circle of Willis. Electronically Signed   By: Marin Roberts M.D.   On: 09/29/2020 15:15    Labs on Admission: I have personally reviewed following labs  CBC: Recent Labs  Lab 09/29/20 1158  WBC 4.8  NEUTROABS 2.8  HGB 11.0*  HCT 35.2*  MCV 77.9*  PLT 322   Basic Metabolic Panel: Recent Labs  Lab 09/29/20 1158  NA 139  K 4.1  CL 103  CO2 29  GLUCOSE 111*  BUN 18  CREATININE 1.18*  CALCIUM 9.0   GFR: Estimated Creatinine Clearance: 79.9 mL/min (A) (by C-G formula based on SCr of 1.18 mg/dL (H)).  Liver Function Tests: Recent Labs  Lab 09/29/20 1158  AST 17  ALT 11  ALKPHOS 52  BILITOT 0.5  PROT 7.9  ALBUMIN 3.6   Coagulation Profile: Recent Labs  Lab 09/29/20 1158  INR 0.9   Dr. Sedalia Muta [facility]  If 7PM-7AM, please contact overnight-coverage provider If 7AM-7PM, please contact day coverage provider [email or web address]  09/29/2020, 4:52 PM

## 2020-09-29 NOTE — ED Notes (Signed)
BP elevated, Dr. Sedalia Muta aware, awaiting pharmacy to verify home BP medications.

## 2020-09-29 NOTE — ED Notes (Signed)
IV attempted 2x by 2 separate ED staff, no access obtained. Patient taken to CT scan.

## 2020-09-29 NOTE — ED Notes (Signed)
Patient ambulated to and from restroom without assistance, steady gait noted.

## 2020-09-29 NOTE — Consult Note (Addendum)
Neurology Consultation Reason for Consult: Right sided weakness Requesting Physician: Shaune Pollack  CC: Right sided weakness   History is obtained from: Patient and chart review   HPI: Chirstina Coraima Molina is a 45 y.o. female with a past medical history significant for hypertension, hyperlipidemia, obesity (BMI 44.1), 25-pack-year smoking history (quit 1997), marijuana use, prediabetes (A1c 5.9% 08/07/2020).  She has residual cognitive difficulty, mild aphasia, intermittent difficulty with the left visual fields.  She reports that she was in her usual state of health yesterday she began to have more word finding difficulty than normal while at a cookout (around 2 PM she noticed she kept calling her cousin by the wrong name).  Later that evening reportedly around 7 PM she had increased right arm and leg stiffness, and weakness.  She reports she has never had these symptoms before, but review of records reveals she has given different histories at different times to different providers including for example different reports on the number of miscarriages she has had or whether or not she has had miscarriages, inability to remember some hospitalizations and stroke symptoms etc.  From what is noted in medical records she did have a presentation to person hospital in December 2020 with speech changes, and presented 07/29/2020 with unclear symptoms, diagnosed with an ischemic stroke on MRI but left AMA.  She then represented on 4/25 with episodic alterations in mental status (confusion).  MRI brain showed restricted diffusion in the posterior right temporal lobe, right lateral parietal lobe and anterior lateral portions of the right occipital lobe as well as right insula.  Vessel imaging was notable for multifocal stenoses with concern for vasculitis versus atherosclerosis.  TTE and TEE were negative for intracardiac thrombus or PFO.  Prolonged EEG demonstrated focal slowing in the right posterior quadrant  but no electrographic seizures/epileptiform activity.  She has had extensive hypercoagulability work-up with a prior antibeta-2 glycoprotein serology that was elevated in March 2021 but not on repeat testing in April 2022.  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  Given her prior stroke history, concern for vasculitis versus multifocal atherosclerosis, took the patient emergently for CTA as well as MRI brain which were both negative for acute intracranial process.  LKW: 2 PM 6/18 tPA given?: No, due to out of the window and recent stroke within 3 months Premorbid modified rankin scale: 1-2     1 - No significant disability. Able to carry out all usual activities, despite some symptoms.     2 - Slight disability. Able to look after own affairs without assistance, but unable to carry out all previous activities.  ROS: All other review of systems was negative except as noted in the HPI, with the caveat of patient's baseline memory impairments  Past Medical History:  Diagnosis Date   Hypertension    Stroke [facility])    Past Surgical History:  Procedure Laterality Date   CESAREAN SECTION     THYROIDECTOMY      Family History  Problem Relation Age of Onset   Healthy Mother    Social History:  reports that she has never smoked. She has never used smokeless tobacco. She reports current alcohol use. She reports previous drug use. (Marijuana)  Exam: Current vital signs: BP (!) 228/110 (BP Location: Right Arm)   Pulse 67   Temp 98.4 F (36.9 C) (Oral)   Resp 18  Ht 5\' 5"  (1.651 m)   Wt 120.2 kg   LMP 09/22/2020 (Approximate)   SpO2 100%   BMI 44.10 kg/m  Vital signs in last 24 hours: Temp:  [98.4 F (36.9 C)] 98.4 F (36.9 C) (06/19 1129) Pulse Rate:  [67] 67 (06/19 1129) Resp:  [18] 18 (06/19 1129) BP: (228)/(110) 228/110 (06/19 1129) SpO2:  [100 %] 100 % (06/19  1129) Weight:  [120.2 kg] 120.2 kg (06/19 1126)   Physical Exam  Constitutional: Appears well-developed and well-nourished.  Psych: Affect flat, mildly irritable at times but overall cooperative Eyes: No scleral injection HENT: No oropharyngeal obstruction.  MSK: no joint deformities.  Cardiovascular: Normal rate and regular rhythm.  Respiratory: Effort normal, non-labored breathing GI: Soft.  No distension. There is no tenderness.  Skin: Warm dry and intact visible skin  Neuro: Mental Status: Patient is awake, alert, oriented to person, place, month, year, and situation. Patient is able to give a clear and coherent history. She does have some mild word finding difficulties, slight loss of fluency, difficulty repeating complex sentences.  At times but not reliably she has visual extinction on the left to double sided stimuli.  She also does not reliably report touch on the lower extremities, with possible left/right confusion versus extinguishing on the left intermittently Cranial Nerves: II: Visual Fields are full. Pupils are equal, round, and reactive to light.   III,IV, VI: EOMI without ptosis or diploplia.  V: Facial sensation is symmetric to temperature VII: Facial movement is notable for a slight left facial droop.  VIII: hearing is intact to voice X: Uvula elevates symmetrically XI: Shoulder shrug is symmetric. XII: tongue is midline without atrophy or fasciculations.  Motor: Tone is normal. Bulk is normal. 5/5 strength was present in all four extremities except right hip flexion is barely antigravity 3/5 (with encouragement, 2/5 without encouragement), and right knee extension is 4/5, with plantar flexion 4/5 and dorsiflexion 4+/5.  She does have some mild finger curling and slight drift of the right upper extremity on pronator drift testing, and 4+/5 grip strength on the right Sensory: She initially reports that sensation is intact in all 4 extremities, but then reports she  is having pain with movement of the right lower extremity Deep Tendon Reflexes: 2+ and symmetric in the biceps and patellae.  Plantars: Toes are downgoing bilaterally.  Cerebellar: Finger-nose is intact bilaterally, patient does not attempt heel-to-shin secondary to weakness/pain  NIH score 7  Score breakdown:  1 point for initial answering that the month is May, one-point for left facial droop (baseline), one-point for right upper extremity drift, one-point for right lower extremity drift, one-point for mild aphasia, one-point for mild dysarthria, one-point for partial neglect (likely baseline) (last prior documented score is 2 for left hemianopia which does not appear to be present today when hospitalized at [facility], notably this was resolved on neurology follow-up 09/11/2020 at which time NIH was 0 though she was reporting some intermittent symptoms)   I have reviewed labs in epic and the results pertinent to this consultation are: Cr 1.18  I have reviewed the images obtained: Head CT without clear acute intracranial process, multiple prior strokes as detailed previously CTA with multifocal narrowing, no acute LVO or new significant stenosis, appears stable from CTA in April, favor atherosclerosis versus vasculitis MRI brain with evolution of the recent right parietal stroke, no new acute process  Impression: 45 year old with multiple strokes presenting with new onset right-sided weakness and negative CNS imaging.  Given  imaging was obtained while patient was still symptomatic, favor alternative localization such as cervical spine for her symptoms.  Given her pain with leg raise, some of her leg symptoms could also be related to lumbar spine issues but this is less likely to be emergent so will hold on MRI lumbar spine imaging for now. Hypertensive emergency could be contributing to some of her cognitive symptoms and this could also potentially be recrudescence although she has not clearly had  right-sided weakness documented with prior strokes before  Recommendations: -CTA head and neck obtained on my recommendations as above -MRI brain obtained on my recommendations as above -Treatment of hypertensive emergency per standard protocols -MRI cervical spine to rule out acute compressive process -Neurology will follow  Brooke Dare MD-PhD [facility] [phone] [facility] coverage for [facility] is from 8 AM to 4 AM in-house and 4 PM to 8 PM by telephone/video. 8 PM to 8 AM emergent questions or overnight urgent questions should be addressed to Teleneurology On-call or [facility] neurohospitalist; contact information can be found on AMION

## 2020-09-29 NOTE — ED Triage Notes (Signed)
Pt c/o right leg weakness, slurred speech and feeling confused since yesterday evening, pt states she has a hx of stroke. Pt is a/ox4 on arrival

## 2020-09-29 NOTE — ED Provider Notes (Signed)
Emergency Medicine Provider Triage Evaluation Note  [name] , a 45 y.o. female  was evaluated in triage.  Pt complains of right sided weakness and slurred speech that began last night. Pt has history of stroke in April of 2022, treated at [facility]. Is on baseline aspirin daily. Denies any chest pain, shortness of breath, syncope. Last known well was prior to bed last night, around 7 PM. Reports her right foot has been dragging, slurred speech and feels confused.  Review of Systems  Positive: Slurred speech, right sided weakness Negative: Headache, chest pain, shortness of breath  Physical Exam  BP (!) 228/110 (BP Location: Right Arm)   Pulse 67   Temp 98.4 F (36.9 C) (Oral)   Resp 18   Ht 5\' 5"  (1.651 m)   Wt 120.2 kg   LMP 09/22/2020 (Approximate)   SpO2 100%   BMI 44.10 kg/m  Gen:   Awake, no distress  Resp:  Normal effort Neuro:  Cranial Nerves 2-12 grossly intact. Noted right sided weakness of 4/5 of the right lower extremity compared to 5/5 on the right. No pronator drift noted. Mild slurring of speech noted intermittently.   Medical Decision Making  Medically screening exam initiated at 11:34 AM.  Appropriate orders placed.  Charlane 11/22/2020 was informed that the remainder of the evaluation will be completed by another provider, this initial triage assessment does not replace that evaluation, and the importance of remaining in the ED until their evaluation is complete.  Patient also seen in triage by attending Dr. Rockwell Alexandria.   Larinda Buttery, PA 09/29/20 1138    10/01/20, MD 09/29/20 1524

## 2020-09-30 ENCOUNTER — Encounter: Payer: Self-pay | Admitting: Internal Medicine

## 2020-09-30 DIAGNOSIS — I160 Hypertensive urgency: Secondary | ICD-10-CM | POA: Diagnosis not present

## 2020-09-30 LAB — BASIC METABOLIC PANEL
Anion gap: 8 (ref 5–15)
BUN: 16 mg/dL (ref 6–20)
CO2: 26 mmol/L (ref 22–32)
Calcium: 9.1 mg/dL (ref 8.9–10.3)
Chloride: 102 mmol/L (ref 98–111)
Creatinine, Ser: 1.06 mg/dL — ABNORMAL HIGH (ref 0.44–1.00)
GFR, Estimated: >60 mL/min (ref >60)
Glucose, Bld: 119 mg/dL — ABNORMAL HIGH (ref 70–99)
Potassium: 3.6 mmol/L (ref 3.5–5.1)
Sodium: 136 mmol/L (ref 135–145)

## 2020-09-30 LAB — CBC
HCT: 36 % (ref 36.0–46.0)
Hemoglobin: 11.4 g/dL — ABNORMAL LOW (ref 12.0–15.0)
MCH: 24.4 pg — ABNORMAL LOW (ref 26.0–34.0)
MCHC: 31.7 g/dL (ref 30.0–36.0)
MCV: 77.1 fL — ABNORMAL LOW (ref 80.0–100.0)
Platelets: 339 10*3/uL (ref 150–400)
RBC: 4.67 MIL/uL (ref 3.87–5.11)
RDW: 17.3 % — ABNORMAL HIGH (ref 11.5–15.5)
WBC: 5.5 10*3/uL (ref 4.0–10.5)
nRBC: 0 % (ref 0.0–0.2)

## 2020-09-30 LAB — HIV ANTIBODY (ROUTINE TESTING W REFLEX): HIV Screen 4th Generation wRfx: NONREACTIVE

## 2020-09-30 NOTE — Discharge Summary (Signed)
Physician Discharge Summary  [name] KKX:[id number] DOB: [birth year] DOA: 09/29/2020  PCP: Fanny Dance, FNP  Admit date: 09/29/2020 Discharge date: 09/30/2020  Admitted From: Home Disposition: Home  Recommendations for Outpatient Follow-up:  Follow up with PCP in 1-2 weeks Please obtain BMP/CBC in one week Please follow up on the following pending results: None  Home Health: No Equipment/Devices: None Discharge Condition: Stable CODE STATUS: Full Diet recommendation: Heart Healthy / Carb Modified   Brief/Interim Summary: [name] is a 45 y.o. female with medical history significant for hypertension, hyperlipidemia, morbid obesity, glucose intolerance, current marijuana use, history of multiple CVAs, residual cognitive difficulty, mild aphasia, intermittent difficulty with left visual fields, former tobacco user, presents to the emergency department for chief concerns of sudden onset of right lower extremity weakness.   On 09/28/2020, she was at a cookout with her family in the afternoon when her cousin noticed that she kept calling out the wrong name.  In the same evening she endorses right arm and leg stiffness and weakness.  She reports that the symptoms are new.  She reports that the symptoms are persistent at this time.   Of note, she was extensively worked up for hypercoagulable state by her neurologist in 2021 which was ultimately negative.  She denies history of miscarriages or lupus.  On arrival blood pressure was markedly elevated.  CT head and MRI was negative for any acute changes.  Expected evolution of right parietal infarcts from April.  Inferior medial right occipital lobe infarct is stable and remote.  Remote left frontal lobe infarct.  Remote lacunar infarct of the basal ganglia bilaterally.  Chronic microvascular ischemia.  MRI cervical spine was also without any significant abnormality   She was admitted for hypertensive urgency.   Apparently noncompliant with her multiple antihypertensives.  She received IV hydralazine and her home antihypertensives were resumed which resulted in improvement in her blood pressure.  Patient is being discharged on her current medical regimen which includes multiple antihypertensives.  She was advised to check her blood pressure regularly at home and keep a log and present that log to her primary care provider for better management.  Discharge Diagnoses:  Principal Problem:   Hypertensive urgency Active Problems:   History of ischemic left MCA stroke   Basal ganglia stroke (HCC)   Essential hypertension   Obesity, Class III, BMI 40-49.9 (morbid obesity) (HCC)   Hyperlipidemia, mixed   Discharge Instructions  Discharge Instructions     Diet - low sodium heart healthy   Complete by: As directed    Discharge instructions   Complete by: As directed    It was pleasure taking care of you. Your stroke work-up is negative-you did not had any new stroke. It is very important that you keep taking your blood pressure medications as directed and follow-up very closely with your doctor for better control of high blood pressure to prevent further damage. Keep yourself well-hydrated   Increase activity slowly   Complete by: As directed       Allergies as of 09/30/2020       Reactions   Labetalol Hcl         Medication List     TAKE these medications    acetaminophen 500 MG tablet Commonly known as: TYLENOL Take 500-1,000 mg by mouth daily as needed.   amLODipine 10 MG tablet Commonly known as: NORVASC Take 10 mg by mouth daily.   aspirin 325 MG tablet Take 325 mg by  mouth daily.   ergocalciferol 1.25 MG (50000 UT) capsule Commonly known as: VITAMIN D2 Take 50,000 Units by mouth once a week. On wednesdays   fluticasone 50 MCG/ACT nasal spray Commonly known as: FLONASE Place 1 spray into both nostrils daily for 14 days.   hydrochlorothiazide 25 MG tablet Commonly  known as: HYDRODIURIL Take 25 mg by mouth daily.   lisinopril 40 MG tablet Commonly known as: ZESTRIL Take 40 mg by mouth daily.   metoprolol succinate 50 MG 24 hr tablet Commonly known as: TOPROL-XL Take 50 mg by mouth 2 (two) times daily.   omeprazole 40 MG capsule Commonly known as: PRILOSEC Take 40 mg by mouth daily.   spironolactone 50 MG tablet Commonly known as: ALDACTONE Take 50 mg by mouth daily.       ASK your doctor about these medications    cetirizine 10 MG tablet Commonly known as: ZyrTEC Allergy Take 1 tablet (10 mg total) by mouth daily.        Follow-up Information     Cobb, Glenice Laine, FNP. Schedule an appointment as soon as possible for a visit in 1 week(s).   Specialty: Family Medicine Contact information: [address] [place] [address] [phone]                Allergies  Allergen Reactions   Labetalol Hcl     Consultations: Neurology  Procedures/Studies: CT Head Wo Contrast  Result Date: 09/29/2020 CLINICAL DATA:  Neuro deficit, acute, stroke suspected. Additional provided: Right-sided weakness and slurred speech which began last night. History of stroke April 2022. EXAM: CT HEAD WITHOUT CONTRAST TECHNIQUE: Contiguous axial images were obtained from the base of the skull through the vertex without intravenous contrast. COMPARISON:  Prior head CT 08/05/2020.  Brain MRI 08/01/2020. FINDINGS: Brain: Redemonstrated chronic cortical/subcortical infarct within the mid left frontal lobe. Known chronic cortical/subcortical right MCA territory infarcts, most notably within the right parietal lobe and right temporoparietal junction. New from the prior head CT of 08/06/2018 there is a 1.8 cm curvilinear focus of hyperdensity within the right parietal infarction territory, which may reflect interval development of cortical laminar necrosis or petechial hemorrhage (series 5, image 18) (series 4, images 48-53). Known chronic  lacunar infarcts within the left greater than right basal ganglia and left thalamus. Background moderate patchy and ill-defined hypoattenuation within the cerebral white matter, nonspecific but compatible with chronic small vessel ischemic disease. Redemonstrated chronic infarcts within the right cerebellar hemisphere. No acute demarcated cortical infarct is identified. No extra-axial fluid collection. No evidence of intracranial mass. No midline shift. Vascular: No hyperdense vessel. Skull: Normal. Negative for fracture or focal lesion. Sinuses/Orbits: Visualized orbits show no acute finding. No significant paranasal sinus disease at the imaged levels. These results were called by telephone at the time of interpretation on 09/29/2020 at 12:51 pm to provider Delton Prairie, who verbally acknowledged these results. IMPRESSION: No acute demarcated cortical infarction is identified. Redemonstrated chronic cortical/subcortical infarcts within the mid left frontal lobe and right MCA vascular territory. New from the prior head CT of 08/28/2020, there is a 1.8 cm focus of curvilinear hyperdensity within the chronic right parietal lobe infarction territory, which may reflect interval development of cortical laminar necrosis or petechial hemorrhage. Known chronic lacunar infarcts within the bilateral basal ganglia and left thalamus. Stable background moderate cerebral white matter chronic small vessel ischemic disease. Redemonstrated chronic infarcts within the right cerebellum. Electronically Signed   By: Jackey Loge DO  On: 09/29/2020 12:54   MR BRAIN WO CONTRAST  Result Date: 09/29/2020 CLINICAL DATA:  Right-sided weakness and slurred speech beginning last night. Recent infarct April 2022 EXAM: MRI HEAD WITHOUT CONTRAST TECHNIQUE: Multiplanar, multiecho pulse sequences of the brain and surrounding structures were obtained without intravenous contrast. COMPARISON:  MR head without and with contrast 08/01/2020 at [facility]. CT head without contrast 09/29/2020 FINDINGS: Brain: Expected evolution of right parietal infarcts noted. Remote left frontal lobe infarct noted. Diffuse T2 signal changes associated with the involving right parietal infarcts from April. The inferior medial right cerebellar infarct is stable and remote. Remote blood products noted within the right parietal infarct. No acute hemorrhage is present. Diffuse periventricular T2 signal changes present bilaterally. There is some ex vacuo dilation of the left lateral ventricle related to the volume loss. Remote lacunar infarcts are present in the basal ganglia bilaterally, stable. Subtle asymmetric white matter changes extend into the brainstem on the left, stable. The internal auditory canals are within normal limits. Vascular: Flow is present in the major intracranial arteries. Skull and upper cervical spine: The craniocervical junction is normal. Upper cervical spine is within normal limits. Marrow signal is unremarkable. Sinuses/Orbits: The paranasal sinuses and mastoid air cells are clear. The globes and orbits are within normal limits. IMPRESSION: 1. No acute intracranial abnormality. 2. Inferior medial right occipital lobe infarct is stable and remote. 3. Expected evolution of right parietal infarcts from of April. 4. Remote left frontal lobe infarct. 5. Remote lacunar infarcts of the basal ganglia bilaterally, stable. 6. Diffuse periventricular white matter disease bilaterally is moderately advanced for age. This likely reflects the sequela of chronic microvascular ischemia. Electronically Signed   By: Marin Roberts M.D.   On: 09/29/2020 15:27   MR CERVICAL SPINE WO CONTRAST  Result Date: 09/29/2020 CLINICAL DATA:  Myelopathy EXAM: MRI CERVICAL SPINE WITHOUT CONTRAST TECHNIQUE: Multiplanar, multisequence MR imaging of the cervical spine was performed. No intravenous contrast was administered. COMPARISON:  None. FINDINGS: Alignment:  Physiologic. Vertebrae: No fracture, evidence of discitis, or bone lesion. Cord: Normal signal and morphology. Posterior Fossa, vertebral arteries, paraspinal tissues: Negative Disc levels: The intervertebral disc levels of the cervical spine are normal. At T1-2, there is a small central disc protrusion narrowing the ventral thecal sac without spinal canal stenosis. IMPRESSION: 1. No cervical spinal canal or neural foraminal stenosis. 2. T1-2 small central disc protrusion narrowing the ventral thecal sac without spinal canal stenosis. Electronically Signed   By: Deatra Robinson M.D.   On: 09/29/2020 20:36   CT ANGIO HEAD NECK W WO CM (CODE STROKE)  Result Date: 09/29/2020 CLINICAL DATA:  Slurred speech.  Right-sided weakness. EXAM: CT ANGIOGRAPHY HEAD AND NECK TECHNIQUE: Multidetector CT imaging of the head and neck was performed using the standard protocol during bolus administration of intravenous contrast. Multiplanar CT image reconstructions and MIPs were obtained to evaluate the vascular anatomy. Carotid stenosis measurements (when applicable) are obtained utilizing NASCET criteria, using the distal internal carotid diameter as the denominator. CONTRAST:  44mL OMNIPAQUE IOHEXOL 350 MG/ML SOLN COMPARISON:  CT head without contrast 09/29/2020. CTA head and neck 08/01/2020 at [facility]. FINDINGS: CTA NECK FINDINGS Aortic arch: Common origin of the left common carotid artery and innominate artery noted. No significant stenosis at the arch. Right carotid system: The right common carotid artery is within normal limits. Minimal atherosclerotic changes present at the bifurcation. Cervical right ICA is within normal limits. Left carotid system: The left common carotid artery is  within normal limits. Bifurcation is unremarkable. Cervical left ICA is normal. Vertebral arteries: The left vertebral artery is slightly dominant. Both vertebral arteries originate from the subclavian arteries without  significant stenosis. There is no significant stenosis or vascular injury to either vertebral artery in the neck. Skeleton: Vertebral body heights and alignment are normal. No focal lytic or blastic lesions are present. Other neck: Soft tissues the neck are otherwise unremarkable. Upper chest: The lung apices are clear. Thoracic inlet is within normal limits. Right thyroidectomy noted. Review of the MIP images confirms the above findings CTA HEAD FINDINGS Anterior circulation: The internal carotid arteries are within normal limits from the high cervical segments through the ICA termini. The A1 and M1 segments are normal. No definite anterior communicating artery is present. MCA bifurcations are within normal limits. ACA and MCA branch vessels demonstrates some distal segmental narrowing without a significant proximal stenosis or occlusion. Posterior circulation: The left vertebral artery is the dominant vessel. PICA origins are visualized and within normal limits. Vertebrobasilar junction normal. Basilar artery is within normal limits. The right posterior cerebral artery originates from basilar tip. Left posterior cerebral artery receives equal contribution from a left P1 segment and the posterior communicating artery. The PCA branch vessels are within normal limits bilaterally. Venous sinuses: Dural sinuses are patent. The straight sinus deep cerebral veins are intact. Cortical veins are unremarkable. No vascular malformations are present. Anatomic variants: None Review of the MIP images confirms the above findings IMPRESSION: 1. No emergent large vessel occlusion. 2. Minimal atherosclerotic changes at the right carotid bifurcation without significant stenosis. 3. Mild distal small vessel disease without a significant proximal stenosis, aneurysm, or branch vessel occlusion within the Circle of Willis. Electronically Signed   By: Marin Robertshristopher  Mattern M.D.   On: 09/29/2020 15:15    Subjective: Patient was seen and  examined. No complaints, wants to go home.  We discussed about staying compliant with her medications as she was missing a lot of doses for different reasons. She was also told to check her blood pressure regularly and keep that log and take it to her primary care provider for better management of her medications.  Discharge Exam: Vitals:   09/30/20 0236 09/30/20 0900  BP: (!) 164/105 (!) 152/92  Pulse: 70 64  Resp: 20 18  Temp: 98.7 F (37.1 C) 98.7 F (37.1 C)  SpO2: 100% 100%   Vitals:   09/30/20 0146 09/30/20 0236 09/30/20 0250 09/30/20 0900  BP: 136/80 (!) 164/105  (!) 152/92  Pulse: 64 70  64  Resp: 16 20  18   Temp: 98.1 F (36.7 C) 98.7 F (37.1 C)  98.7 F (37.1 C)  TempSrc: Oral   Oral  SpO2: 99% 100%  100%  Weight:   117.6 kg   Height:   5\' 5"  (1.651 m)     General: Pt is alert, awake, not in acute distress Cardiovascular: RRR, S1/S2 +, no rubs, no gallops Respiratory: CTA bilaterally, no wheezing, no rhonchi Abdominal: Soft, NT, ND, bowel sounds + Extremities: no edema, no cyanosis   The results of significant diagnostics from this hospitalization (including imaging, microbiology, ancillary and laboratory) are listed below for reference.    Microbiology: Recent Results (from the past 240 hour(s))  Resp Panel by RT-PCR (Flu A&B, Covid) Nasopharyngeal Swab     Status: None   Collection Time: 09/29/20  6:42 PM   Specimen: Nasopharyngeal Swab; Nasopharyngeal(NP) swabs in vial transport medium  Result Value Ref Range Status   SARS  Coronavirus 2 by RT PCR NEGATIVE NEGATIVE Final    Comment: (NOTE) SARS-CoV-2 target nucleic acids are NOT DETECTED.  The SARS-CoV-2 RNA is generally detectable in upper respiratory specimens during the acute phase of infection. The lowest concentration of SARS-CoV-2 viral copies this assay can detect is 138 copies/mL. A negative result does not preclude SARS-Cov-2 infection and should not be used as the sole basis for treatment  or other patient management decisions. A negative result may occur with  improper specimen collection/handling, submission of specimen other than nasopharyngeal swab, presence of viral mutation(s) within the areas targeted by this assay, and inadequate number of viral copies(<138 copies/mL). A negative result must be combined with clinical observations, patient history, and epidemiological information. The expected result is Negative.  Fact Sheet for Patients:  BloggerCourse.com  Fact Sheet for Healthcare Providers:  SeriousBroker.it  This test is no t yet approved or cleared by the [place] FDA and  has been authorized for detection and/or diagnosis of SARS-CoV-2 by FDA under an Emergency Use Authorization (EUA). This EUA will remain  in effect (meaning this test can be used) for the duration of the COVID-19 declaration under Section 564(b)(1) of the Act, 21 U.S.C.section 360bbb-3(b)(1), unless the authorization is terminated  or revoked sooner.       Influenza A by PCR NEGATIVE NEGATIVE Final   Influenza B by PCR NEGATIVE NEGATIVE Final    Comment: (NOTE) The Xpert Xpress SARS-CoV-2/FLU/RSV plus assay is intended as an aid in the diagnosis of influenza from Nasopharyngeal swab specimens and should not be used as a sole basis for treatment. Nasal washings and aspirates are unacceptable for Xpert Xpress SARS-CoV-2/FLU/RSV testing.  Fact Sheet for Patients: BloggerCourse.com  Fact Sheet for Healthcare Providers: SeriousBroker.it  This test is not yet approved or cleared by the [place] FDA and has been authorized for detection and/or diagnosis of SARS-CoV-2 by FDA under an Emergency Use Authorization (EUA). This EUA will remain in effect (meaning this test can be used) for the duration of the COVID-19 declaration under Section 564(b)(1) of the Act, 21 U.S.C. section  360bbb-3(b)(1), unless the authorization is terminated or revoked.  Performed at [facility], [address] [place], [place] [address]      Labs: BNP (last 3 results) No results for input(s): BNP in the last 8760 hours. Basic Metabolic Panel: Recent Labs  Lab 09/29/20 1158 09/30/20 0428  NA 139 136  K 4.1 3.6  CL 103 102  CO2 29 26  GLUCOSE 111* 119*  BUN 18 16  CREATININE 1.18* 1.06*  CALCIUM 9.0 9.1   Liver Function Tests: Recent Labs  Lab 09/29/20 1158  AST 17  ALT 11  ALKPHOS 52  BILITOT 0.5  PROT 7.9  ALBUMIN 3.6   No results for input(s): LIPASE, AMYLASE in the last 168 hours. No results for input(s): AMMONIA in the last 168 hours. CBC: Recent Labs  Lab 09/29/20 1158 09/30/20 0428  WBC 4.8 5.5  NEUTROABS 2.8  --   HGB 11.0* 11.4*  HCT 35.2* 36.0  MCV 77.9* 77.1*  PLT 322 339   Cardiac Enzymes: No results for input(s): CKTOTAL, CKMB, CKMBINDEX, TROPONINI in the last 168 hours. BNP: Invalid input(s): POCBNP CBG: No results for input(s): GLUCAP in the last 168 hours. D-Dimer No results for input(s): DDIMER in the last 72 hours. Hgb A1c No results for input(s): HGBA1C in the last 72 hours. Lipid Profile No results for input(s): CHOL, HDL, LDLCALC, TRIG, CHOLHDL, LDLDIRECT in the  last 72 hours. Thyroid function studies No results for input(s): TSH, T4TOTAL, T3FREE, THYROIDAB in the last 72 hours.  Invalid input(s): FREET3 Anemia work up No results for input(s): VITAMINB12, FOLATE, FERRITIN, TIBC, IRON, RETICCTPCT in the last 72 hours. Urinalysis No results found for: COLORURINE, APPEARANCEUR, LABSPEC, PHURINE, GLUCOSEU, HGBUR, BILIRUBINUR, KETONESUR, PROTEINUR, UROBILINOGEN, NITRITE, LEUKOCYTESUR Sepsis Labs Invalid input(s): PROCALCITONIN,  WBC,  LACTICIDVEN Microbiology Recent Results (from the past 240 hour(s))  Resp Panel by RT-PCR (Flu A&B, Covid) Nasopharyngeal Swab     Status: None   Collection Time: 09/29/20  6:42 PM    Specimen: Nasopharyngeal Swab; Nasopharyngeal(NP) swabs in vial transport medium  Result Value Ref Range Status   SARS Coronavirus 2 by RT PCR NEGATIVE NEGATIVE Final    Comment: (NOTE) SARS-CoV-2 target nucleic acids are NOT DETECTED.  The SARS-CoV-2 RNA is generally detectable in upper respiratory specimens during the acute phase of infection. The lowest concentration of SARS-CoV-2 viral copies this assay can detect is 138 copies/mL. A negative result does not preclude SARS-Cov-2 infection and should not be used as the sole basis for treatment or other patient management decisions. A negative result may occur with  improper specimen collection/handling, submission of specimen other than nasopharyngeal swab, presence of viral mutation(s) within the areas targeted by this assay, and inadequate number of viral copies(<138 copies/mL). A negative result must be combined with clinical observations, patient history, and epidemiological information. The expected result is Negative.  Fact Sheet for Patients:  BloggerCourse.com  Fact Sheet for Healthcare Providers:  SeriousBroker.it  This test is no t yet approved or cleared by the [place] FDA and  has been authorized for detection and/or diagnosis of SARS-CoV-2 by FDA under an Emergency Use Authorization (EUA). This EUA will remain  in effect (meaning this test can be used) for the duration of the COVID-19 declaration under Section 564(b)(1) of the Act, 21 U.S.C.section 360bbb-3(b)(1), unless the authorization is terminated  or revoked sooner.       Influenza A by PCR NEGATIVE NEGATIVE Final   Influenza B by PCR NEGATIVE NEGATIVE Final    Comment: (NOTE) The Xpert Xpress SARS-CoV-2/FLU/RSV plus assay is intended as an aid in the diagnosis of influenza from Nasopharyngeal swab specimens and should not be used as a sole basis for treatment. Nasal washings and aspirates are  unacceptable for Xpert Xpress SARS-CoV-2/FLU/RSV testing.  Fact Sheet for Patients: BloggerCourse.com  Fact Sheet for Healthcare Providers: SeriousBroker.it  This test is not yet approved or cleared by the [place] FDA and has been authorized for detection and/or diagnosis of SARS-CoV-2 by FDA under an Emergency Use Authorization (EUA). This EUA will remain in effect (meaning this test can be used) for the duration of the COVID-19 declaration under Section 564(b)(1) of the Act, 21 U.S.C. section 360bbb-3(b)(1), unless the authorization is terminated or revoked.  Performed at [facility], [address] [place], [place] [address]     Time coordinating discharge: Over 30 minutes  SIGNED:  Arnetha Courser, MD  [facility] 09/30/2020, 11:09 AM  If 7PM-7AM, please contact night-coverage [email or web address]  This record has been created using Conservation officer, historic buildings. Errors have been sought and corrected,but may not always be located. Such creation errors do not reflect on the standard of care.

## 2021-02-15 ENCOUNTER — Emergency Department: Payer: Medicaid Other

## 2021-02-15 DIAGNOSIS — R4701 Aphasia: Secondary | ICD-10-CM | POA: Diagnosis present

## 2021-02-15 DIAGNOSIS — I10 Essential (primary) hypertension: Secondary | ICD-10-CM | POA: Insufficient documentation

## 2021-02-15 DIAGNOSIS — Z79899 Other long term (current) drug therapy: Secondary | ICD-10-CM | POA: Insufficient documentation

## 2021-02-15 DIAGNOSIS — Z7982 Long term (current) use of aspirin: Secondary | ICD-10-CM | POA: Insufficient documentation

## 2021-02-15 DIAGNOSIS — I639 Cerebral infarction, unspecified: Secondary | ICD-10-CM | POA: Insufficient documentation

## 2021-02-15 LAB — CBG MONITORING, ED: Glucose-Capillary: 108 mg/dL — ABNORMAL HIGH (ref 70–99)

## 2021-02-15 MED ORDER — SODIUM CHLORIDE 0.9% FLUSH
3.0000 mL | Freq: Once | INTRAVENOUS | Status: DC
Start: 2021-02-16 — End: 2021-02-16

## 2021-02-15 NOTE — ED Triage Notes (Signed)
Patient brought in by family for aphasia. Hx of multiple previous strokes with same presenting symptom. Patient's symptoms started at 2230 today.

## 2021-02-16 ENCOUNTER — Other Ambulatory Visit: Payer: Self-pay

## 2021-02-16 ENCOUNTER — Inpatient Hospital Stay (HOSPITAL_COMMUNITY): Payer: Medicaid Other | Admitting: Critical Care Medicine

## 2021-02-16 ENCOUNTER — Encounter (HOSPITAL_COMMUNITY): Payer: Self-pay | Admitting: Interventional Radiology

## 2021-02-16 ENCOUNTER — Inpatient Hospital Stay (HOSPITAL_COMMUNITY)
Admission: RE | Admit: 2021-02-16 | Discharge: 2021-02-21 | DRG: 023 | Disposition: A | Discharge status: Rehabilitation Facility | Payer: Medicaid Other | Source: Ambulatory Visit | Attending: Neurology | Admitting: Neurology

## 2021-02-16 ENCOUNTER — Inpatient Hospital Stay (HOSPITAL_COMMUNITY): Payer: Medicaid Other

## 2021-02-16 ENCOUNTER — Encounter (HOSPITAL_COMMUNITY)
Admission: RE | Disposition: A | Discharge status: Rehabilitation Facility | Payer: Self-pay | Source: Ambulatory Visit | Attending: Neurology

## 2021-02-16 ENCOUNTER — Emergency Department
Admission: EM | Admit: 2021-02-16 | Discharge: 2021-02-16 | Payer: Medicaid Other | Attending: Emergency Medicine | Admitting: Emergency Medicine

## 2021-02-16 ENCOUNTER — Emergency Department: Payer: Medicaid Other

## 2021-02-16 DIAGNOSIS — R482 Apraxia: Secondary | ICD-10-CM | POA: Diagnosis present

## 2021-02-16 DIAGNOSIS — Z006 Encounter for examination for normal comparison and control in clinical research program: Secondary | ICD-10-CM | POA: Diagnosis not present

## 2021-02-16 DIAGNOSIS — G936 Cerebral edema: Secondary | ICD-10-CM | POA: Diagnosis present

## 2021-02-16 DIAGNOSIS — Z713 Dietary counseling and surveillance: Secondary | ICD-10-CM

## 2021-02-16 DIAGNOSIS — I639 Cerebral infarction, unspecified: Secondary | ICD-10-CM

## 2021-02-16 DIAGNOSIS — I672 Cerebral atherosclerosis: Secondary | ICD-10-CM | POA: Diagnosis present

## 2021-02-16 DIAGNOSIS — I63412 Cerebral infarction due to embolism of left middle cerebral artery: Principal | ICD-10-CM | POA: Diagnosis present

## 2021-02-16 DIAGNOSIS — I6389 Other cerebral infarction: Secondary | ICD-10-CM

## 2021-02-16 DIAGNOSIS — Z87891 Personal history of nicotine dependence: Secondary | ICD-10-CM | POA: Diagnosis not present

## 2021-02-16 DIAGNOSIS — E785 Hyperlipidemia, unspecified: Secondary | ICD-10-CM | POA: Diagnosis present

## 2021-02-16 DIAGNOSIS — G8191 Hemiplegia, unspecified affecting right dominant side: Secondary | ICD-10-CM | POA: Diagnosis present

## 2021-02-16 DIAGNOSIS — G4700 Insomnia, unspecified: Secondary | ICD-10-CM | POA: Diagnosis present

## 2021-02-16 DIAGNOSIS — I169 Hypertensive crisis, unspecified: Secondary | ICD-10-CM | POA: Diagnosis present

## 2021-02-16 DIAGNOSIS — N179 Acute kidney failure, unspecified: Secondary | ICD-10-CM | POA: Diagnosis present

## 2021-02-16 DIAGNOSIS — Z20822 Contact with and (suspected) exposure to covid-19: Secondary | ICD-10-CM | POA: Diagnosis present

## 2021-02-16 DIAGNOSIS — Z6841 Body Mass Index (BMI) 40.0 and over, adult: Secondary | ICD-10-CM

## 2021-02-16 DIAGNOSIS — R4701 Aphasia: Secondary | ICD-10-CM | POA: Diagnosis present

## 2021-02-16 DIAGNOSIS — Z8673 Personal history of transient ischemic attack (TIA), and cerebral infarction without residual deficits: Secondary | ICD-10-CM

## 2021-02-16 DIAGNOSIS — I10 Essential (primary) hypertension: Secondary | ICD-10-CM

## 2021-02-16 DIAGNOSIS — E781 Pure hyperglyceridemia: Secondary | ICD-10-CM | POA: Diagnosis present

## 2021-02-16 DIAGNOSIS — J9600 Acute respiratory failure, unspecified whether with hypoxia or hypercapnia: Secondary | ICD-10-CM | POA: Diagnosis present

## 2021-02-16 DIAGNOSIS — R451 Restlessness and agitation: Secondary | ICD-10-CM | POA: Diagnosis not present

## 2021-02-16 DIAGNOSIS — I69320 Aphasia following cerebral infarction: Secondary | ICD-10-CM | POA: Diagnosis not present

## 2021-02-16 DIAGNOSIS — Z7722 Contact with and (suspected) exposure to environmental tobacco smoke (acute) (chronic): Secondary | ICD-10-CM | POA: Diagnosis present

## 2021-02-16 DIAGNOSIS — I6602 Occlusion and stenosis of left middle cerebral artery: Secondary | ICD-10-CM

## 2021-02-16 DIAGNOSIS — Z978 Presence of other specified devices: Secondary | ICD-10-CM

## 2021-02-16 DIAGNOSIS — D509 Iron deficiency anemia, unspecified: Secondary | ICD-10-CM | POA: Diagnosis present

## 2021-02-16 DIAGNOSIS — E876 Hypokalemia: Secondary | ICD-10-CM | POA: Diagnosis present

## 2021-02-16 DIAGNOSIS — H518 Other specified disorders of binocular movement: Secondary | ICD-10-CM | POA: Diagnosis present

## 2021-02-16 DIAGNOSIS — I69352 Hemiplegia and hemiparesis following cerebral infarction affecting left dominant side: Secondary | ICD-10-CM | POA: Diagnosis not present

## 2021-02-16 DIAGNOSIS — R1310 Dysphagia, unspecified: Secondary | ICD-10-CM | POA: Diagnosis present

## 2021-02-16 DIAGNOSIS — I69351 Hemiplegia and hemiparesis following cerebral infarction affecting right dominant side: Secondary | ICD-10-CM | POA: Diagnosis not present

## 2021-02-16 DIAGNOSIS — Z7982 Long term (current) use of aspirin: Secondary | ICD-10-CM

## 2021-02-16 DIAGNOSIS — E890 Postprocedural hypothyroidism: Secondary | ICD-10-CM | POA: Diagnosis present

## 2021-02-16 DIAGNOSIS — R29707 NIHSS score 7: Secondary | ICD-10-CM | POA: Diagnosis present

## 2021-02-16 DIAGNOSIS — K5901 Slow transit constipation: Secondary | ICD-10-CM | POA: Diagnosis not present

## 2021-02-16 DIAGNOSIS — I63512 Cerebral infarction due to unspecified occlusion or stenosis of left middle cerebral artery: Secondary | ICD-10-CM | POA: Diagnosis not present

## 2021-02-16 DIAGNOSIS — Z79899 Other long term (current) drug therapy: Secondary | ICD-10-CM

## 2021-02-16 HISTORY — PX: RADIOLOGY WITH ANESTHESIA: SHX6223

## 2021-02-16 HISTORY — DX: Anemia, unspecified: D64.9

## 2021-02-16 HISTORY — PX: IR CT HEAD LTD: IMG2386

## 2021-02-16 HISTORY — DX: Nontoxic goiter, unspecified: E04.9

## 2021-02-16 HISTORY — DX: Low back pain, unspecified: M54.50

## 2021-02-16 HISTORY — PX: IR PERCUTANEOUS ART THROMBECTOMY/INFUSION INTRACRANIAL INC DIAG ANGIO: IMG6087

## 2021-02-16 LAB — CBC WITH DIFFERENTIAL/PLATELET
Abs Immature Granulocytes: 0.04 10*3/uL (ref 0.00–0.07)
Basophils Absolute: 0 10*3/uL (ref 0.0–0.1)
Basophils Relative: 0 %
Eosinophils Absolute: 0.1 10*3/uL (ref 0.0–0.5)
Eosinophils Relative: 1 %
HCT: 30.3 % — ABNORMAL LOW (ref 36.0–46.0)
Hemoglobin: 8.9 g/dL — ABNORMAL LOW (ref 12.0–15.0)
Immature Granulocytes: 1 %
Lymphocytes Relative: 28 %
Lymphs Abs: 2.2 10*3/uL (ref 0.7–4.0)
MCH: 20.3 pg — ABNORMAL LOW (ref 26.0–34.0)
MCHC: 29.4 g/dL — ABNORMAL LOW (ref 30.0–36.0)
MCV: 69 fL — ABNORMAL LOW (ref 80.0–100.0)
Monocytes Absolute: 0.4 10*3/uL (ref 0.1–1.0)
Monocytes Relative: 5 %
Neutro Abs: 5.1 10*3/uL (ref 1.7–7.7)
Neutrophils Relative %: 65 %
Platelets: 399 10*3/uL (ref 150–400)
RBC: 4.39 MIL/uL (ref 3.87–5.11)
RDW: 19.4 % — ABNORMAL HIGH (ref 11.5–15.5)
WBC: 7.7 10*3/uL (ref 4.0–10.5)
nRBC: 0 % (ref 0.0–0.2)

## 2021-02-16 LAB — IRON AND TIBC
Iron: 13 ug/dL — ABNORMAL LOW (ref 28–170)
Saturation Ratios: 3 % — ABNORMAL LOW (ref 10.4–31.8)
TIBC: 445 ug/dL (ref 250–450)
UIBC: 432 ug/dL

## 2021-02-16 LAB — RETICULOCYTES
Immature Retic Fract: 28.4 % — ABNORMAL HIGH (ref 2.3–15.9)
RBC.: 4.4 MIL/uL (ref 3.87–5.11)
Retic Count, Absolute: 39.6 10*3/uL (ref 19.0–186.0)
Retic Ct Pct: 0.9 % (ref 0.4–3.1)

## 2021-02-16 LAB — DIFFERENTIAL
Abs Immature Granulocytes: 0.02 10*3/uL (ref 0.00–0.07)
Basophils Absolute: 0 10*3/uL (ref 0.0–0.1)
Basophils Relative: 0 %
Eosinophils Absolute: 0.1 10*3/uL (ref 0.0–0.5)
Eosinophils Relative: 1 %
Immature Granulocytes: 0 %
Lymphocytes Relative: 38 %
Lymphs Abs: 2.7 10*3/uL (ref 0.7–4.0)
Monocytes Absolute: 0.5 10*3/uL (ref 0.1–1.0)
Monocytes Relative: 8 %
Neutro Abs: 3.7 10*3/uL (ref 1.7–7.7)
Neutrophils Relative %: 53 %

## 2021-02-16 LAB — LIPID PANEL
Cholesterol: 155 mg/dL (ref 0–200)
HDL: 32 mg/dL — ABNORMAL LOW (ref >40)
LDL Cholesterol: 58 mg/dL (ref 0–99)
Total CHOL/HDL Ratio: 4.8 RATIO
Triglycerides: 325 mg/dL — ABNORMAL HIGH (ref <150)
VLDL: 65 mg/dL — ABNORMAL HIGH (ref 0–40)

## 2021-02-16 LAB — COMPREHENSIVE METABOLIC PANEL
ALT: 9 U/L (ref 0–44)
AST: 15 U/L (ref 15–41)
Albumin: 3.6 g/dL (ref 3.5–5.0)
Alkaline Phosphatase: 56 U/L (ref 38–126)
Anion gap: 7 (ref 5–15)
BUN: 25 mg/dL — ABNORMAL HIGH (ref 6–20)
CO2: 26 mmol/L (ref 22–32)
Calcium: 8.8 mg/dL — ABNORMAL LOW (ref 8.9–10.3)
Chloride: 103 mmol/L (ref 98–111)
Creatinine, Ser: 1.5 mg/dL — ABNORMAL HIGH (ref 0.44–1.00)
GFR, Estimated: 44 mL/min — ABNORMAL LOW (ref >60)
Glucose, Bld: 114 mg/dL — ABNORMAL HIGH (ref 70–99)
Potassium: 3.9 mmol/L (ref 3.5–5.1)
Sodium: 136 mmol/L (ref 135–145)
Total Bilirubin: 0.4 mg/dL (ref 0.3–1.2)
Total Protein: 8.1 g/dL (ref 6.5–8.1)

## 2021-02-16 LAB — CBC
HCT: 30.4 % — ABNORMAL LOW (ref 36.0–46.0)
Hemoglobin: 8.7 g/dL — ABNORMAL LOW (ref 12.0–15.0)
MCH: 20 pg — ABNORMAL LOW (ref 26.0–34.0)
MCHC: 28.6 g/dL — ABNORMAL LOW (ref 30.0–36.0)
MCV: 70 fL — ABNORMAL LOW (ref 80.0–100.0)
Platelets: 339 10*3/uL (ref 150–400)
RBC: 4.34 MIL/uL (ref 3.87–5.11)
RDW: 19.4 % — ABNORMAL HIGH (ref 11.5–15.5)
WBC: 7 10*3/uL (ref 4.0–10.5)
nRBC: 0 % (ref 0.0–0.2)

## 2021-02-16 LAB — BASIC METABOLIC PANEL
Anion gap: 7 (ref 5–15)
BUN: 16 mg/dL (ref 6–20)
CO2: 24 mmol/L (ref 22–32)
Calcium: 8.7 mg/dL — ABNORMAL LOW (ref 8.9–10.3)
Chloride: 104 mmol/L (ref 98–111)
Creatinine, Ser: 1.22 mg/dL — ABNORMAL HIGH (ref 0.44–1.00)
GFR, Estimated: 56 mL/min — ABNORMAL LOW (ref >60)
Glucose, Bld: 151 mg/dL — ABNORMAL HIGH (ref 70–99)
Potassium: 3.1 mmol/L — ABNORMAL LOW (ref 3.5–5.1)
Sodium: 135 mmol/L (ref 135–145)

## 2021-02-16 LAB — RESP PANEL BY RT-PCR (FLU A&B, COVID) ARPGX2
Influenza A by PCR: NEGATIVE
Influenza B by PCR: NEGATIVE
SARS Coronavirus 2 by RT PCR: NEGATIVE

## 2021-02-16 LAB — PROTIME-INR
INR: 1 (ref 0.8–1.2)
Prothrombin Time: 12.8 seconds (ref 11.4–15.2)

## 2021-02-16 LAB — FOLATE: Folate: 9.8 ng/mL (ref >5.9)

## 2021-02-16 LAB — APTT: aPTT: 24 seconds (ref 24–36)

## 2021-02-16 LAB — POCT I-STAT 7, (LYTES, BLD GAS, ICA,H+H)
Acid-Base Excess: 0 mmol/L (ref 0.0–2.0)
Bicarbonate: 25.3 mmol/L (ref 20.0–28.0)
Calcium, Ion: 1.19 mmol/L (ref 1.15–1.40)
HCT: 31 % — ABNORMAL LOW (ref 36.0–46.0)
Hemoglobin: 10.5 g/dL — ABNORMAL LOW (ref 12.0–15.0)
O2 Saturation: 94 %
Patient temperature: 96.6
Potassium: 3 mmol/L — ABNORMAL LOW (ref 3.5–5.1)
Sodium: 138 mmol/L (ref 135–145)
TCO2: 27 mmol/L (ref 22–32)
pCO2 arterial: 41.2 mmHg (ref 32.0–48.0)
pH, Arterial: 7.391 (ref 7.350–7.450)
pO2, Arterial: 70 mmHg — ABNORMAL LOW (ref 83.0–108.0)

## 2021-02-16 LAB — VITAMIN B12: Vitamin B-12: 276 pg/mL (ref 180–914)

## 2021-02-16 LAB — ECHOCARDIOGRAM COMPLETE
AV Mean grad: 12 mmHg
AV Peak grad: 23.2 mmHg
Ao pk vel: 2.41 m/s
Area-P 1/2: 4.49 cm2
Height: 65 in
S' Lateral: 2.9 cm
Weight: 3982.39 oz

## 2021-02-16 LAB — SAMPLE TO BLOOD BANK

## 2021-02-16 LAB — MRSA NEXT GEN BY PCR, NASAL: MRSA by PCR Next Gen: NOT DETECTED

## 2021-02-16 LAB — HEMOGLOBIN A1C
Hgb A1c MFr Bld: 5.7 % — ABNORMAL HIGH (ref 4.8–5.6)
Mean Plasma Glucose: 116.89 mg/dL

## 2021-02-16 LAB — FERRITIN: Ferritin: 4 ng/mL — ABNORMAL LOW (ref 11–307)

## 2021-02-16 LAB — GLUCOSE, CAPILLARY: Glucose-Capillary: 125 mg/dL — ABNORMAL HIGH (ref 70–99)

## 2021-02-16 SURGERY — IR WITH ANESTHESIA
Anesthesia: General

## 2021-02-16 MED ORDER — ACETAMINOPHEN 160 MG/5ML PO SOLN: 650.0000 mg | ORAL | Status: DC | PRN

## 2021-02-16 MED ORDER — ASPIRIN 325 MG PO TABS
325.0000 mg | ORAL_TABLET | Freq: Every day | ORAL | Status: DC
  Administered 2021-02-17 – 2021-02-19 (×3): 325 mg via ORAL
  Filled 2021-02-16 (×3): qty 1

## 2021-02-16 MED ORDER — FENTANYL 2500MCG IN NS 250ML (10MCG/ML) PREMIX INFUSION
0.0000 ug/h | INTRAVENOUS | Status: DC
  Filled 2021-02-16: qty 250

## 2021-02-16 MED ORDER — PROPOFOL 1000 MG/100ML IV EMUL
5.0000 ug/kg/min | INTRAVENOUS | Status: DC
  Administered 2021-02-16: 20 ug/kg/min via INTRAVENOUS
  Filled 2021-02-16: qty 100

## 2021-02-16 MED ORDER — ACETAMINOPHEN 325 MG PO TABS
650.0000 mg | ORAL_TABLET | ORAL | Status: DC | PRN
  Administered 2021-02-17 – 2021-02-20 (×3): 650 mg via ORAL
  Filled 2021-02-16 (×3): qty 2

## 2021-02-16 MED ORDER — SODIUM CHLORIDE (PF) 0.9 % IJ SOLN
INTRAVENOUS | Status: DC | PRN
  Administered 2021-02-16: 25 mg via INTRA_ARTERIAL
  Administered 2021-02-16 (×4): 25 ug via INTRA_ARTERIAL

## 2021-02-16 MED ORDER — PROPOFOL 1000 MG/100ML IV EMUL
INTRAVENOUS | Status: AC
  Filled 2021-02-16: qty 100

## 2021-02-16 MED ORDER — PHENYLEPHRINE HCL-NACL 20-0.9 MG/250ML-% IV SOLN
INTRAVENOUS | Status: DC | PRN
  Administered 2021-02-16: 25 ug/min via INTRAVENOUS

## 2021-02-16 MED ORDER — ORAL CARE MOUTH RINSE
15.0000 mL | OROMUCOSAL | Status: DC
  Administered 2021-02-16 – 2021-02-18 (×18): 15 mL via OROMUCOSAL

## 2021-02-16 MED ORDER — ROCURONIUM BROMIDE 10 MG/ML (PF) SYRINGE
PREFILLED_SYRINGE | INTRAVENOUS | Status: DC | PRN
Start: 2021-02-16 — End: 2021-02-16
  Administered 2021-02-16: 10 mg via INTRAVENOUS
  Administered 2021-02-16: 50 mg via INTRAVENOUS
  Administered 2021-02-16: 10 mg via INTRAVENOUS
  Administered 2021-02-16: 20 mg via INTRAVENOUS

## 2021-02-16 MED ORDER — ACETAMINOPHEN 650 MG RE SUPP: 650.0000 mg | RECTAL | Status: DC | PRN

## 2021-02-16 MED ORDER — CHLORHEXIDINE GLUCONATE 0.12% ORAL RINSE (MEDLINE KIT)
15.0000 mL | Freq: Two times a day (BID) | OROMUCOSAL | Status: DC
  Administered 2021-02-16 – 2021-02-18 (×5): 15 mL via OROMUCOSAL

## 2021-02-16 MED ORDER — METOPROLOL TARTRATE 50 MG PO TABS
50.0000 mg | ORAL_TABLET | Freq: Two times a day (BID) | ORAL | Status: DC
  Administered 2021-02-16 – 2021-02-17 (×2): 50 mg
  Filled 2021-02-16 (×2): qty 1

## 2021-02-16 MED ORDER — DEXMEDETOMIDINE HCL IN NACL 400 MCG/100ML IV SOLN
0.4000 ug/kg/h | INTRAVENOUS | Status: DC
  Administered 2021-02-16 (×2): 0.4 ug/kg/h via INTRAVENOUS
  Filled 2021-02-16 (×2): qty 100

## 2021-02-16 MED ORDER — HYDRALAZINE HCL 20 MG/ML IJ SOLN
10.0000 mg | INTRAMUSCULAR | Status: DC | PRN
  Administered 2021-02-16 – 2021-02-17 (×2): 10 mg via INTRAVENOUS
  Filled 2021-02-16 (×2): qty 1

## 2021-02-16 MED ORDER — ACETAMINOPHEN 325 MG PO TABS: 650.0000 mg | ORAL_TABLET | ORAL | Status: DC | PRN

## 2021-02-16 MED ORDER — PANTOPRAZOLE SODIUM 40 MG IV SOLR
40.0000 mg | INTRAVENOUS | Status: DC
  Administered 2021-02-16 – 2021-02-17 (×2): 40 mg via INTRAVENOUS
  Filled 2021-02-16 (×2): qty 40

## 2021-02-16 MED ORDER — LIDOCAINE 2% (20 MG/ML) 5 ML SYRINGE
INTRAMUSCULAR | Status: DC | PRN
  Administered 2021-02-16: 60 mg via INTRAVENOUS

## 2021-02-16 MED ORDER — ASPIRIN 300 MG RE SUPP
300.0000 mg | Freq: Every day | RECTAL | Status: DC
  Administered 2021-02-16: 300 mg via RECTAL
  Filled 2021-02-16 (×2): qty 1

## 2021-02-16 MED ORDER — FENTANYL CITRATE (PF) 100 MCG/2ML IJ SOLN
INTRAMUSCULAR | Status: DC | PRN
  Administered 2021-02-16: 100 ug via INTRAVENOUS

## 2021-02-16 MED ORDER — CEFAZOLIN SODIUM-DEXTROSE 2-3 GM-%(50ML) IV SOLR
INTRAVENOUS | Status: DC | PRN
  Administered 2021-02-16: 2 g via INTRAVENOUS

## 2021-02-16 MED ORDER — ONDANSETRON HCL 4 MG/2ML IJ SOLN
INTRAMUSCULAR | Status: DC | PRN
  Administered 2021-02-16: 4 mg via INTRAVENOUS

## 2021-02-16 MED ORDER — CHLORHEXIDINE GLUCONATE CLOTH 2 % EX PADS
6.0000 | MEDICATED_PAD | Freq: Every day | CUTANEOUS | Status: DC
  Administered 2021-02-16 – 2021-02-18 (×3): 6 via TOPICAL

## 2021-02-16 MED ORDER — PROPOFOL 10 MG/ML IV BOLUS
INTRAVENOUS | Status: DC | PRN
  Administered 2021-02-16: 170 mg via INTRAVENOUS
  Administered 2021-02-16: 50 mg via INTRAVENOUS
  Administered 2021-02-16: 30 mg via INTRAVENOUS
  Administered 2021-02-16: 50 mg via INTRAVENOUS

## 2021-02-16 MED ORDER — IOHEXOL 350 MG/ML SOLN
100.0000 mL | Freq: Once | INTRAVENOUS | Status: AC | PRN
  Administered 2021-02-16: 80 mL via INTRA_ARTERIAL

## 2021-02-16 MED ORDER — SODIUM CHLORIDE 0.9 % IV SOLN: INTRAVENOUS | Status: DC

## 2021-02-16 MED ORDER — SUCCINYLCHOLINE CHLORIDE 200 MG/10ML IV SOSY
PREFILLED_SYRINGE | INTRAVENOUS | Status: DC | PRN
  Administered 2021-02-16: 120 mg via INTRAVENOUS

## 2021-02-16 MED ORDER — PROPOFOL 500 MG/50ML IV EMUL
INTRAVENOUS | Status: DC | PRN
  Administered 2021-02-16: 50 ug/kg/min via INTRAVENOUS

## 2021-02-16 MED ORDER — POTASSIUM CHLORIDE 20 MEQ PO PACK
40.0000 meq | PACK | Freq: Once | ORAL | Status: AC
Start: 2021-02-16 — End: 2021-02-16
  Administered 2021-02-16: 40 meq
  Filled 2021-02-16: qty 2

## 2021-02-16 MED ORDER — LACTATED RINGERS IV SOLN: INTRAVENOUS | Status: DC | PRN

## 2021-02-16 MED ORDER — IOHEXOL 350 MG/ML SOLN
75.0000 mL | Freq: Once | INTRAVENOUS | Status: AC | PRN
  Administered 2021-02-16: 75 mL via INTRAVENOUS

## 2021-02-16 MED ORDER — ACETAMINOPHEN 650 MG RE SUPP
650.0000 mg | RECTAL | Status: DC | PRN
  Administered 2021-02-17 (×2): 650 mg via RECTAL
  Filled 2021-02-16 (×3): qty 1

## 2021-02-16 MED ORDER — IOHEXOL 350 MG/ML SOLN
100.0000 mL | Freq: Once | INTRAVENOUS | Status: AC | PRN
  Administered 2021-02-16: 70 mL via INTRA_ARTERIAL

## 2021-02-16 MED ORDER — CLEVIDIPINE BUTYRATE 0.5 MG/ML IV EMUL
INTRAVENOUS | Status: DC | PRN
  Administered 2021-02-16: 2 mg/h via INTRAVENOUS

## 2021-02-16 MED ORDER — FENTANYL 2500MCG IN NS 250ML (10MCG/ML) PREMIX INFUSION
0.0000 ug/h | INTRAVENOUS | Status: DC
  Administered 2021-02-16: 100 ug/h via INTRAVENOUS

## 2021-02-16 MED ORDER — STROKE: EARLY STAGES OF RECOVERY BOOK
Freq: Once | Status: DC
  Filled 2021-02-16: qty 1

## 2021-02-16 MED ORDER — POTASSIUM CHLORIDE 10 MEQ/100ML IV SOLN
10.0000 meq | INTRAVENOUS | Status: AC
  Administered 2021-02-16 (×4): 10 meq via INTRAVENOUS
  Filled 2021-02-16 (×4): qty 100

## 2021-02-16 MED ORDER — DEXAMETHASONE SODIUM PHOSPHATE 10 MG/ML IJ SOLN
INTRAMUSCULAR | Status: DC | PRN
  Administered 2021-02-16: 4 mg via INTRAVENOUS

## 2021-02-16 MED ORDER — CLEVIDIPINE BUTYRATE 0.5 MG/ML IV EMUL
0.0000 mg/h | INTRAVENOUS | Status: AC
Start: 2021-02-16 — End: 2021-02-17
  Administered 2021-02-16 (×4): 21 mg/h via INTRAVENOUS
  Administered 2021-02-16: 16 mg/h via INTRAVENOUS
  Administered 2021-02-16: 15 mg/h via INTRAVENOUS
  Administered 2021-02-16 – 2021-02-17 (×4): 21 mg/h via INTRAVENOUS
  Filled 2021-02-16: qty 100
  Filled 2021-02-16: qty 50
  Filled 2021-02-16: qty 100
  Filled 2021-02-16: qty 50
  Filled 2021-02-16 (×5): qty 100
  Filled 2021-02-16 (×2): qty 50

## 2021-02-16 NOTE — Anesthesia Procedure Notes (Signed)
Arterial Line Insertion Start/End11/09/2020 2:15 AM, 02/16/2021 2:20 AM Performed by: Atilano Median, DO, anesthesiologist  Patient location: OOR procedure area. Preanesthetic checklist: patient identified, IV checked, site marked, risks and benefits discussed, surgical consent, monitors and equipment checked, pre-op evaluation, timeout performed and anesthesia consent Emergency situation Lidocaine 1% used for infiltration Left, radial was placed Catheter size: 20 G Hand hygiene performed  and maximum sterile barriers used   Attempts: 1 Procedure performed without using ultrasound guided technique. Following insertion, dressing applied and Biopatch. Post procedure assessment: normal and unchanged  Patient tolerated the procedure well with no immediate complications.

## 2021-02-16 NOTE — Progress Notes (Signed)
OT Cancellation Note  Patient Details Name: Terrance Lanahan MRN: [id number] DOB: 1976/01/04   Cancelled Treatment:    Reason Eval/Treat Not Completed: Active bedrest order (Intubated. Check back as time allows.) Flora Lipps, OTR/L Acute Rehabilitation Services Pager: [phone] Office: [phone]   Lonzo Cloud 02/16/2021, 11:14 AM

## 2021-02-16 NOTE — ED Provider Notes (Signed)
[facility] Emergency Department Provider Note  ____________________________________________  Time seen: Approximately 12:24 AM  I have reviewed the triage vital signs and the nursing notes.   HISTORY  Chief Complaint Code Stroke  Level 5 caveat:  Portions of the history and physical were unable to be obtained due to aphasia, confusion   HPI [name] is a 45 y.o. female with a history of several prior strokes, hypertension, obesity, hyperlipidemia who presents for evaluation of aphasia.  Family last spoke with her on the phone at 5:30 PM.  He seems that she called her son around 13 PM and was having difficulty expressing herself.  She was then brought in by the family for evaluation.  Patient is having expressive aphasia and some difficulty understanding and following simple commands.  Unable to answer any questions.   Past Medical History:  Diagnosis Date   Hypertension    Stroke [facility])     Patient Active Problem List   Diagnosis Date Noted   Hypertensive urgency 09/29/2020   History of ischemic left MCA stroke 09/29/2020   Basal ganglia stroke (HCC) 09/29/2020   Essential hypertension 09/29/2020   Obesity, Class III, BMI 40-49.9 (morbid obesity) (HCC) 09/29/2020   Hyperlipidemia, mixed 09/29/2020    Past Surgical History:  Procedure Laterality Date   CESAREAN SECTION     THYROIDECTOMY      Prior to Admission medications   Medication Sig Start Date End Date Taking? Authorizing Provider  acetaminophen (TYLENOL) 500 MG tablet Take 500-1,000 mg by mouth daily as needed.   Yes [provider]  amLODipine (NORVASC) 10 MG tablet Take 10 mg by mouth daily. 08/10/20 08/10/21 Yes [provider]  aspirin 81 MG EC tablet Take 81 mg by mouth daily.   Yes [provider]  atorvastatin (LIPITOR) 80 MG tablet Take 80 mg by mouth daily.   Yes [provider]  ergocalciferol (VITAMIN D2) 1.25 MG (50000 UT)  capsule Take 50,000 Units by mouth once a week. On wednesdays   Yes [provider]  hydrochlorothiazide (HYDRODIURIL) 25 MG tablet Take 25 mg by mouth daily. 08/10/20 08/10/21 Yes [provider]  lisinopril (ZESTRIL) 40 MG tablet Take 40 mg by mouth daily. 08/30/20  Yes [provider]  metoprolol succinate (TOPROL-XL) 50 MG 24 hr tablet Take 50 mg by mouth 2 (two) times daily. 08/28/19  Yes [provider]  omeprazole (PRILOSEC) 40 MG capsule Take 40 mg by mouth daily.   Yes [provider]  spironolactone (ALDACTONE) 50 MG tablet Take 50 mg by mouth daily. 09/29/19  Yes [provider]  cetirizine (ZYRTEC ALLERGY) 10 MG tablet Take 1 tablet (10 mg total) by mouth daily. Patient taking differently: Take 10 mg by mouth daily as needed. 12/18/19   Lamptey, Britta Mccreedy, MD    Allergies Labetalol hcl  Family History  Problem Relation Age of Onset   Healthy Mother     Social History Social History   Tobacco Use   Smoking status: Never    Passive exposure: Past   Smokeless tobacco: Never  Vaping Use   Vaping Use: Never used  Substance Use Topics   Alcohol use: Yes    Comment: occassionally   Drug use: Yes    Types: Marijuana    Comment: occassionally    Review of Systems  Constitutional: Negative for fever. Eyes: Negative for visual changes. ENT: Negative for sore throat. Neck: No neck pain  Cardiovascular: Negative for chest pain.  Respiratory: Negative for shortness of breath. Gastrointestinal: Negative for abdominal pain, vomiting or diarrhea. Genitourinary: Negative for dysuria. Musculoskeletal: Negative for back pain. Skin: Negative for rash. Neurological: + aphasia Psych: No SI or HI  ____________________________________________   PHYSICAL EXAM:  VITAL SIGNS: ED Triage Vitals [02/15/21 2348]  Enc Vitals Group     BP (!) 181/105     Pulse Rate 71     Resp 20     Temp      Temp src      SpO2 98 %     Weight       Height      Head Circumference      Peak Flow      Pain Score      Pain Loc      Pain Edu?      Excl. in GC?     Constitutional: Alert and oriented, no apparent distress. HEENT:      Head: Normocephalic and atraumatic.         Eyes: Conjunctivae are normal. Sclera is non-icteric.       Mouth/Throat: Mucous membranes are moist.       Neck: Supple with no signs of meningismus. Cardiovascular: Regular rate and rhythm. No murmurs, gallops, or rubs. 2+ symmetrical distal pulses are present in all extremities. No JVD. Respiratory: Normal respiratory effort. Lungs are clear to auscultation bilaterally.  Gastrointestinal: Soft, non tender. Musculoskeletal:  No edema, cyanosis, or erythema of extremities. Neurologic: Able to say yes and no appropriately, but unable to recall any other words. Speech is not slurred. Patient is having difficulty following simple commands, unable to move both LE but she is able to hold the RLE up when the leg is elevated by me. Unable to hold LLE up which drifts immediately to the bed.  Skin: Skin is warm, dry and intact. No rash noted. Psychiatric: Mood and affect are normal. Speech and behavior are normal.  ____________________________________________   LABS (all labs ordered are listed, but only abnormal results are displayed)  Labs Reviewed  CBC - Abnormal; Notable for the following components:      Result Value   Hemoglobin 8.7 (*)    HCT 30.4 (*)    MCV 70.0 (*)    MCH 20.0 (*)    MCHC 28.6 (*)    RDW 19.4 (*)    All other components within normal limits  COMPREHENSIVE METABOLIC PANEL - Abnormal; Notable for the following components:   Glucose, Bld 114 (*)    BUN 25 (*)    Creatinine, Ser 1.50 (*)    Calcium 8.8 (*)    GFR, Estimated 44 (*)    All other components within normal limits  CBG MONITORING, ED - Abnormal; Notable for the following components:   Glucose-Capillary 108 (*)    All other components within normal limits  PROTIME-INR   APTT  DIFFERENTIAL  SAMPLE TO BLOOD BANK   ____________________________________________  EKG  ED ECG REPORT I, Nita Sickle, the attending physician, personally viewed and interpreted this ECG.  Sinus rhythm with LAFB, LVH, no ST elevations or depressions. ____________________________________________  RADIOLOGY  I have personally reviewed the images performed during this visit and I agree with the Radiologist's read.   Interpretation by Radiologist:  CT HEAD CODE STROKE WO CONTRAST  Result Date: 02/16/2021 CLINICAL DATA:  Code stroke.  Aphasia EXAM: CT HEAD WITHOUT CONTRAST TECHNIQUE: Contiguous axial images were obtained from the base of the skull through the vertex without intravenous contrast.  COMPARISON:  None. FINDINGS: Brain: There is no mass, hemorrhage or extra-axial collection. The size and configuration of the ventricles and extra-axial CSF spaces are normal. There are old infarcts of the cerebellum, left frontal operculum and right parietal lobe. There is periventricular hypoattenuation compatible with chronic microvascular disease. Vascular: No abnormal hyperdensity of the major intracranial arteries or dural venous sinuses. No intracranial atherosclerosis. Skull: The visualized skull base, calvarium and extracranial soft tissues are normal. Sinuses/Orbits: No fluid levels or advanced mucosal thickening of the visualized paranasal sinuses. No mastoid or middle ear effusion. The orbits are normal. ASPECTS [facility] Stroke Program Early CT Score) - Ganglionic level infarction (caudate, lentiform nuclei, internal capsule, insula, M1-M3 cortex): 7 - Supraganglionic infarction (M4-M6 cortex): 3 Total score (0-10 with 10 being normal): 10 IMPRESSION: 1. No acute intracranial abnormality. 2. 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 3. ASPECTS is 10. These results were called by telephone at the time of interpretation on 02/16/2021 at 12:08 am to  provider [facility] , who verbally acknowledged these results. Electronically Signed   By: Deatra Robinson M.D.   On: 02/16/2021 00:09   CT ANGIO HEAD NECK W WO CM W PERF (CODE STROKE)  Result Date: 02/16/2021 CLINICAL DATA:  Aphasia.  History of multiple strokes. EXAM: CT ANGIOGRAPHY HEAD AND NECK CT PERFUSION BRAIN TECHNIQUE: Multidetector CT imaging of the head and neck was performed using the standard protocol during bolus administration of intravenous contrast. Multiplanar CT image reconstructions and MIPs were obtained to evaluate the vascular anatomy. Carotid stenosis measurements (when applicable) are obtained utilizing NASCET criteria, using the distal internal carotid diameter as the denominator. Multiphase CT imaging of the brain was performed following IV bolus contrast injection. Subsequent parametric perfusion maps were calculated using RAPID software. CONTRAST:  26mL OMNIPAQUE IOHEXOL 350 MG/ML SOLN COMPARISON:  None. FINDINGS: CTA NECK FINDINGS SKELETON: There is no bony spinal canal stenosis. No lytic or blastic lesion. OTHER NECK: Normal pharynx, larynx and major salivary glands. No cervical lymphadenopathy. Unremarkable thyroid gland. UPPER CHEST: No pneumothorax or pleural effusion. No nodules or masses. AORTIC ARCH: There is no calcific atherosclerosis of the aortic arch. There is no aneurysm, dissection or hemodynamically significant stenosis of the visualized portion of the aorta. Conventional 3 vessel aortic branching pattern. The visualized proximal subclavian arteries are widely patent. RIGHT CAROTID SYSTEM: Normal without aneurysm, dissection or stenosis. LEFT CAROTID SYSTEM: Normal without aneurysm, dissection or stenosis. VERTEBRAL ARTERIES: Left dominant configuration. Both origins are clearly patent. There is no dissection, occlusion or flow-limiting stenosis to the skull base (V1-V3 segments). CTA HEAD FINDINGS POSTERIOR CIRCULATION: --Vertebral arteries: Normal V4 segments.  --Inferior cerebellar arteries: Normal. --Basilar artery: Normal. --Superior cerebellar arteries: Normal. --Posterior cerebral arteries (PCA): Normal. ANTERIOR CIRCULATION: --Intracranial internal carotid arteries: Normal. --Anterior cerebral arteries (ACA): Normal. Both A1 segments are present. Patent anterior communicating artery (a-comm). --Middle cerebral arteries (MCA): There is occlusion of the proximal M2 inferior division of the left middle cerebral artery. The right MCA is unremarkable. VENOUS SINUSES: As permitted by contrast timing, patent. ANATOMIC VARIANTS: None Review of the MIP images confirms the above findings. CT Brain Perfusion Findings: ASPECTS: 10 CBF (<30%) Volume: 30mL Perfusion (Tmax>6.0s) volume: 45mL Mismatch Volume: 109mL Infarction Location:Posterior left MCA territory deficit corresponds to the area supplied by the occluded M2 branch of the left MCA. The other deficit areas in the left cerebellum and right temporal lobe may be artifactual, as there is no visualized treatable occlusion. IMPRESSION: 1. Occlusion  of the proximal M2 inferior division of the left middle cerebral artery. 2. Area of ischemia in the posterior left MCA territory corresponding to the area supplied by the occluded vessel. 3. Other areas of perfusion deficit in the left cerebellum and right temporal lobe are less certain. Critical Value/emergent results were called by telephone at the time of interpretation on 02/16/2021 at 1:33 am to provider Dr. Don Perking, who verbally acknowledged these results. Electronically Signed   By: Deatra Robinson M.D.   On: 02/16/2021 01:34     ____________________________________________   PROCEDURES  Procedure(s) performed:yes Procedures   Critical Care performed: yes  CRITICAL CARE Performed by: Nita Sickle  ?  Total critical care time: 30 min  Critical care time was exclusive of separately billable procedures and treating other patients.  Critical care was  necessary to treat or prevent imminent or life-threatening deterioration.  Critical care was time spent personally by me on the following activities: development of treatment plan with patient and/or surrogate as well as nursing, discussions with consultants, evaluation of patient's response to treatment, examination of patient, obtaining history from patient or surrogate, ordering and performing treatments and interventions, ordering and review of laboratory studies, ordering and review of radiographic studies, pulse oximetry and re-evaluation of patient's condition.  ____________________________________________   INITIAL IMPRESSION / ASSESSMENT AND PLAN / ED COURSE  45 y.o. female with a history of several prior strokes, hypertension, obesity, hyperlipidemia who presents for evaluation of aphasia.  Patient evaluated as a code stroke upon arrival by me and telemetry neurologist.  Initial CT head negative for stroke.  Neurologist recommended against tPA.  Recommended CT angio with perfusion which shows an M2 MCA occlusion.  [facility] is being activated for LVO management. Patient will be transferred to [facility]. Accepted by Dr. Amada Jupiter. Consent done by mother who was at bedside.       _____________________________________________ Please note:  Patient was evaluated in Emergency Department today for the symptoms described in the history of present illness. Patient was evaluated in the context of the global COVID-19 pandemic, which necessitated consideration that the patient might be at risk for infection with the SARS-CoV-2 virus that causes COVID-19. Institutional protocols and algorithms that pertain to the evaluation of patients at risk for COVID-19 are in a state of rapid change based on information released by regulatory bodies including the CDC and federal and state organizations. These policies and algorithms were followed during the patient's care in the ED.  Some ED evaluations and  interventions may be delayed as a result of limited staffing during the pandemic.   [place] Controlled Substance Database was reviewed by me. ____________________________________________   FINAL CLINICAL IMPRESSION(S) / ED DIAGNOSES   Final diagnoses:  Cerebrovascular accident (CVA), unspecified mechanism (HCC)      NEW MEDICATIONS STARTED DURING THIS VISIT:  ED Discharge Orders     None        Note:  This document was prepared using Dragon voice recognition software and may include unintentional dictation errors.    Nita Sickle, MD 02/16/21 [phone]

## 2021-02-16 NOTE — H&P (Signed)
Neurology H&P  CC: Aphasia  History is obtained from: Chart review, patient  HPI: [name] is a 45 y.o. female with a history of previous stroke who was last known to be normal around 530.  Around nine, she was having difficulty speaking, but it is unclear exactly when this started.  She was brought into the emergency department at [facility] where she was evaluated by teleneurology.  She was outside the window for tenecteplase, but a CTA/CTP was performed.  This demonstrated an area of ischemia in the left parietal region and an M2 occlusion.  Aspects was 10.  Due to this, she was brought to [facility] emergently for thrombectomy.  LKW: 5:30 PM tpa given?: No, outside window IR Thrombectomy?  Yes Modified Rankin Scale: 1-No significant post stroke disability and can perform usual duties with stroke symptoms 1A: Level of Consciousness - 0 1B: Ask Month and Age - 2 1C: 'Blink Eyes' & 'Squeeze Hands' - 1 2: Test Horizontal Extraocular Movements - 1 3: Test Visual Fields - 1? 4: Test Facial Palsy - 0 5A: Test Left Arm Motor Drift - 0 5B: Test Right Arm Motor Drift - 0 6A: Test Left Leg Motor Drift - 0 6B: Test Right Leg Motor Drift - 0 7: Test Limb Ataxia - 0 8: Test Sensation - 0 9: Test Language/Aphasia- 2 10: Test Dysarthria - 0 11: Test Extinction/Inattention - 0 NIHSS score: 7   ROS: Unable to obtain due to altered mental status.   Past Medical History:  Diagnosis Date   Hypertension    Stroke [facility])      Family History  Problem Relation Age of Onset   Healthy Mother      Social History:  reports that she has never smoked. She has been exposed to tobacco smoke. She has never used smokeless tobacco. She reports current alcohol use. She reports current drug use. Drug: Marijuana.   Prior to Admission medications   Medication Sig Start Date End Date Taking? Authorizing Provider  acetaminophen (TYLENOL) 500 MG tablet Take 500-1,000 mg by mouth  daily as needed.    [provider]  amLODipine (NORVASC) 10 MG tablet Take 10 mg by mouth daily. 08/10/20 08/10/21  [provider]  aspirin 81 MG EC tablet Take 81 mg by mouth daily.    [provider]  atorvastatin (LIPITOR) 80 MG tablet Take 80 mg by mouth daily.    [provider]  cetirizine (ZYRTEC ALLERGY) 10 MG tablet Take 1 tablet (10 mg total) by mouth daily. Patient taking differently: Take 10 mg by mouth daily as needed. 12/18/19   Lamptey, Britta Mccreedy, MD  ergocalciferol (VITAMIN D2) 1.25 MG (50000 UT) capsule Take 50,000 Units by mouth once a week. On wednesdays    [provider]  hydrochlorothiazide (HYDRODIURIL) 25 MG tablet Take 25 mg by mouth daily. 08/10/20 08/10/21  [provider]  lisinopril (ZESTRIL) 40 MG tablet Take 40 mg by mouth daily. 08/30/20   [provider]  metoprolol succinate (TOPROL-XL) 50 MG 24 hr tablet Take 50 mg by mouth 2 (two) times daily. 08/28/19   [provider]  omeprazole (PRILOSEC) 40 MG capsule Take 40 mg by mouth daily.    [provider]  spironolactone (ALDACTONE) 50 MG tablet Take 50 mg by mouth daily. 09/29/19   [provider]     Exam: Current vital signs: There were no vitals filed for this visit.    Physical Exam  Constitutional: Appears  well-developed and well-nourished.  Psych: Affect appropriate to situation Eyes: No scleral injection HENT: No OP obstrucion Head: Normocephalic.  Cardiovascular: Normal rate and regular rhythm.  Respiratory: Effort normal and breath sounds normal to anterior ascultation GI: Soft.  No distension. There is no tenderness.  Skin: WDI  Neuro: Mental Status: Patient is awake, alert,  Cranial Nerves: II: She does not clearly blink to threat from either direction, but does fixate and track.  Left pupil is slightly larger than right, but both are reactive III,IV, VI: EOMI without ptosis or diploplia.  V: Facial  sensation is symmetric to temperature VII: Facial movement is symmetric.  VIII: hearing is intact to voice X: Uvula elevates symmetrically XI: Shoulder shrug is symmetric. XII: tongue is midline without atrophy or fasciculations.  Motor: Tone is normal. Bulk is normal. 5/5 strength was present in all four extremities.  Sensory: Sensation is symmetric to light touch and temperature in the arms and legs. Cerebellar: No clear ataxia  I have reviewed labs in epic and the pertinent results are: Creatinine 1.5 Hemoglobin 8.7  I have reviewed the images obtained: CT/CTA/CTP-left M2 occlusion  Primary Diagnosis:  Cerebral infarction due to embolism of  left middle cerebral artery.   Secondary Diagnosis: Essential (primary) hypertension and Acute Kidney Failure Anemia  Impression: 45 year old female with a history of previous stroke who presents with left M2 occlusion.  Her predominant symptom is aphasia, and it is a fairly significant aphasia at that.  Given her young age and disabling nature of the deficit, after discussion with her mother she was taken for emergent thrombectomy.  Plan: Acute ischemic stroke - HgbA1c, fasting lipid panel - MRI of the brain without contrast - Frequent neuro checks - Echocardiogram - CTA head and neck - Prophylactic therapy-pending outcome of thrombectomy - Risk factor modification - Telemetry monitoring - PT consult, OT consult, Speech consult  AKI -Hydration overnight  Anemia -send anemia panel  This patient is critically ill and at significant risk of neurological worsening, death and care requires constant monitoring of vital signs, hemodynamics,respiratory and cardiac monitoring, neurological assessment, discussion with family, other specialists and medical decision making of high complexity. I spent 60 minutes of neurocritical care time  in the care of  this patient. This was time spent independent of any time provided by nurse practitioner  or PA.  Roland Rack, MD [facility] [phone]  If 7pm- 7am, please page neurology on call as listed in [place].

## 2021-02-16 NOTE — Progress Notes (Signed)
eLink Physician-Brief Progress Note Patient Name: [name] DOB: [birth year] MRN: [id number]   Date of Service  02/16/2021  HPI/Events of Note  Agitation - Patient on a Propofol IV infusion at near ceiling dose of 75 mcg/kg/min.  eICU Interventions  Plan: Fentanyl IV infusion. Titrate to RASS = 0 to -1.     Intervention Category Major Interventions: Delirium, psychosis, severe agitation - evaluation and management  Shaundra Fullam Eugene 02/16/2021, 5:47 AM

## 2021-02-16 NOTE — Progress Notes (Signed)
  Echocardiogram 2D Echocardiogram has been performed.  Roosvelt Maser F 02/16/2021, 1:33 PM

## 2021-02-16 NOTE — Progress Notes (Signed)
Patient ID: [name], female   DOB: 29-Aug-1975, 45 y.o.   MRN: [id number] INR 45y RT H F MRS 1 LSW ?7 hrs earlier.  New onset aphasia . CT brain No ICH ASPECTS 10. CTA occluded LT MCA inf division. CTP core 0 and mismatch of 63ml. Endovascular treatment D/W mother in a 3 way witnessed call. Procedure, reasons risks and alternatives reviewed.  Risks of ICH of 10 % ,worsening neuro condition ,inability to revascularize and death discussed. Mother expressed understanding and provided consent for the treatment. S.Anamari Galeas MD

## 2021-02-16 NOTE — ED Notes (Signed)
Neurology paged for MD veronese r/t CT results.

## 2021-02-16 NOTE — Progress Notes (Signed)
PT Cancellation Note  Patient Details Name: [name] MRN: [id number] DOB: March 06, 1976   Cancelled Treatment:    Reason Eval/Treat Not Completed: Patient not medically ready. Spoke with RN after extubation, asked to hold until tomorrow. Will check back at that time.   Lyanne Co, PT  Acute Rehab Services  Pager [phone] Office [phone]    Lawana Chambers Kalonji Zurawski 02/16/2021, 1:43 PM

## 2021-02-16 NOTE — Progress Notes (Signed)
PT Cancellation Note  Patient Details Name: [name] MRN: [id number] DOB: [birth year]   Cancelled Treatment:    Reason Eval/Treat Not Completed: Active bedrest order (beginning 0531 11/6, 6 hrs). Please update activity orders when bedrest expires. PT will check back in afternoon.   Lyanne Co, PT  Acute Rehab Services  Pager [phone] Office [phone]    [name] 02/16/2021, 7:33 AM

## 2021-02-16 NOTE — Progress Notes (Signed)
Pt transported from IR to room 4N26 on vent with no issues.

## 2021-02-16 NOTE — Procedures (Signed)
S/P Lt common carotid arteriogram  RT CFA approach. S/P revascularization of occluded Inf division of Lt MCA with x1 pass with 40 mm solitairex retriever and contact aspiration achieving a TICI 3 reperfusion. 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 Reopened  with sec pass with combination as above.achieving a TICI 2C .  Reccluded ,with unsuccessful x 2 contact aspirations . Final TICI score 2b/2C. Post CT brain Lt basal ganglia blush and mild Lt ant parietal subarachnoid hyperdensity.  No mass effect .  71F angioseal for RT CFA closure. Distal pulses all dopplerable. Pupils 50mm Rt = LT Patient left intubated pending covid test results. S.Allicia Culley MD

## 2021-02-16 NOTE — Transfer of Care (Signed)
Immediate Anesthesia Transfer of Care Note  Patient: [name]  Procedure(s) Performed: IR WITH ANESTHESIA  Patient Location: ICU  Anesthesia Type:General  Level of Consciousness: sedated and Patient remains intubated per anesthesia plan  Airway & Oxygen Therapy: Patient remains intubated per anesthesia plan and Patient placed on Ventilator (see vital sign flow sheet for setting)  Post-op Assessment: Report given to RN and Post -op Vital signs reviewed and stable.  Post vital signs: Reviewed and stable  Last Vitals:  Vitals Value Taken Time  BP    Temp    Pulse 80 02/16/21 0544  Resp 16 02/16/21 0544  SpO2 95 % 02/16/21 0544  Vitals shown include unvalidated device data.  Last Pain: There were no vitals filed for this visit.       Complications: No notable events documented.

## 2021-02-16 NOTE — Code Documentation (Signed)
Responded to Code IR called at 0113 on pt coming from [facility] with L M2 occlusion. Pt arrived at 0155 and escorted to IR.

## 2021-02-16 NOTE — Procedures (Signed)
Extubation Procedure Note  Patient Details:   Name: [name] DOB: 1975-04-24 MRN: [id number]   Airway Documentation:    Vent end date: 02/16/21 Vent end time: 1145   Evaluation  O2 sats: stable throughout Complications: No apparent complications Patient did tolerate procedure well. Bilateral Breath Sounds: Clear, Diminished   No  Pt was extubated per Md order and placed on 4 L [place]. Cuff leak was noted prior to exubation and no stridor post. Pt is stable at this time. RT will monitor.   Merlene Laughter 02/16/2021, 11:51 AM

## 2021-02-16 NOTE — Consult Note (Signed)
[facility] TeleNeurology Consult Services   Patient Name:   [name], [name] Date of Birth:   April 14, 1975 Identification Number:   MRN - [id number] Date of Service:   02/16/2021 00:15:33  Diagnosis:       I63.9 - Cerebrovascular accident (CVA), unspecified mechanism (HCC)  Impression:      stroke with left M2 occlusion  transfer to [facility] for endovascular care and clot retrieval with inpatient neurology as well  Metrics: Last Known Well: 02/15/2021 18:00:00 [facility] Notification Time: 02/16/2021 00:15:33 Arrival Time: 02/15/2021 22:57:00 Stamp Time: 02/16/2021 00:15:33 Initial Response Time: 02/16/2021 00:17:00 Symptoms: speech problems. NIHSS Start Assessment Time: 02/16/2021 00:21:10 Patient is not a candidate for Thrombolytic. Thrombolytic Medical Decision: 02/16/2021 00:32:22 Patient was not deemed candidate for Thrombolytic because of following reasons: Last Well Known Above 4.5 Hours.  CT head showed no acute hemorrhage or acute core infarct.  ED Physician notified of diagnostic impression and management plan on 02/16/2021 00:50:16  Advanced Imaging: CTA Head and Neck Completed.  LVO:Yes  Discussed with NIR:Yes  Discussed with NIR Time:02/16/2021 01:00:28  Discussed with NIR Text:  accepted case for transfer and possible clot retreival    Routine Consultation with Inhouse Neurology for Follow up Care  Sign Out:       Discussed with Emergency Department Provider    ------------------------------------------------------------------------------  History of Present Illness: Patient is a 45 year old Female.  Patient was brought by EMS for symptoms of speech problems.  this is a 45 year old female who accordiing to her family went down for a nap at about 6 PM November 5, when she woke up she was having difficulty speaking. The patient is having a hard time expressing herself and is unable to offer any significant history. Reportedly she has  a history of prior stroke including including a left MCA stroke, per family forr a period of time she had difficulty with speech   Past Medical History:      Hypertension      Hyperlipidemia  Anticoagulant use:  No  Antiplatelet use: Yes asa  Allergies:  Reviewed    Examination: BP(181//105), Pulse(68), Blood Glucose(108) 1A: Level of Consciousness - Alert; keenly responsive + 0 1B: Ask Month and Age - Could Not Answer Either Question Correctly + 2 1C: Blink Eyes & Squeeze Hands - Performs 1 Task + 1 2: Test Horizontal Extraocular Movements - Normal + 0 3: Test Visual Fields - No Visual Loss + 0 4: Test Facial Palsy (Use Grimace if Obtunded) - Normal symmetry + 0 5A: Test Left Arm Motor Drift - No Drift for 10 Seconds + 0 5B: Test Right Arm Motor Drift - No Drift for 10 Seconds + 0 6A: Test Left Leg Motor Drift - No Drift for 5 Seconds + 0 6B: Test Right Leg Motor Drift - No Drift for 5 Seconds + 0 7: Test Limb Ataxia (FNF/Heel-Shin) - No Ataxia + 0 8: Test Sensation - Normal; No sensory loss + 0 9: Test Language/Aphasia - Severe Aphasia: Fragmentary Expression, Inference Needed, Cannot Identify Materials + 2 10: Test Dysarthria - Normal + 0 11: Test Extinction/Inattention - No abnormality + 0  NIHSS Score: 5   Pre-Morbid Modified Rankin Scale: 0 Points = No symptoms at all   Patient/Family was informed the Neurology Consult would occur via TeleHealth consult by way of interactive audio and video telecommunications and consented to receiving care in this manner.   Patient is being evaluated for possible acute neurologic impairment and high probability of imminent  or life-threatening deterioration. I spent total of 20 minutes providing care to this patient, including time for face to face visit via telemedicine, review of medical records, imaging studies and discussion of findings with providers, the patient and/or family.   Dr Janean Sark   [facility] [phone]  Case [id number]

## 2021-02-16 NOTE — Consult Note (Signed)
NAME:  [name], MRN:  [id number], DOB:  11-24-1975, LOS: 0 ADMISSION DATE:  02/16/2021, CONSULTATION DATE:  02/16/21 REFERRING MD:  Corliss Skains, CHIEF COMPLAINT:  aphasia   History of Present Illness:  [name] is a 45 y.o. F with PMH of HTN and prior CVA who presented to [facility] with aphasia.  She was out of the window for TPA, but CTA showed L parietal ischemia and M2 occlusion.  She was transferred emergently to [facility] hospital for thrombectomy.  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  She was left intubated as Covid-19 test pending.   Pertinent  Medical History   has a past medical history of Hypertension and Stroke (HCC).   Significant Hospital Events: Including procedures, antibiotic start and stop dates in addition to other pertinent events   11/6 Presented to [facility], transferred to [facility] for thrombectomy, left intubated for Covid test, on Cleviprex  Interim History / Subjective:  Pt intermittently agitated and requiring propofol and fentanyl, covid-19 and flu negative.  Likely extubate this AM  Objective   Height 5\' 5"  (1.651 m), SpO2 97 %.    Vent Mode: PRVC FiO2 (%):  [50 %] 50 % Set Rate:  [14 bmp] 14 bmp Vt Set:  [450 mL] 450 mL PEEP:  [5 cmH20] 5 cmH20 Plateau Pressure:  [19 cmH20] 19 cmH20   Intake/Output Summary (Last 24 hours) at 02/16/2021 0554 Last data filed at 02/16/2021 0537 Gross per 24 hour  Intake 800 ml  Output 100 ml  Net 700 ml   There were no vitals filed for this visit.  General:  obese F, intubated and sedated HEENT: MM pink/moist, ETT in place, pupils equal Neuro: examined on propofol, pt coughing and spontaneously moving the RUE CV: s1s2 rrr, no m/r/g PULM:  course upper airway sounds, on full vent support with PEEP 5 and 50% FiO2 GI: soft, bsx4 active  Extremities: warm/dry, 1+ edema  Skin: no rashes or lesions   Resolved Hospital Problem  list     Assessment & Plan:    Acute CVA, L M2 occlusion with intubation for thrombectomy HTN TICI 2C Left intubated because Covid-19 results pending -Covid-19 and flu results are negative  -Pt restless and with intermittent spikes in BP on Cleviprex, once BP under control will trial SBT/SAT and likely extubate --Maintain full vent support  -titrate Vent setting to maintain SpO2 greater than or equal to 90%. -HOB elevated 30 degrees. -Plateau pressures less than 30 cm H20.  -Follow chest x-ray, ABG prn.   -Bronchial hygiene and RT/bronchodilator protocol.    Best Practice (right click and "Reselect all SmartList Selections" daily)   Per primary  Labs   CBC: Recent Labs  Lab 02/16/21 0015  WBC 7.0  NEUTROABS 3.7  HGB 8.7*  HCT 30.4*  MCV 70.0*  PLT 339    Basic Metabolic Panel: Recent Labs  Lab 02/16/21 0015  NA 136  K 3.9  CL 103  CO2 26  GLUCOSE 114*  BUN 25*  CREATININE 1.50*  CALCIUM 8.8*   GFR: Estimated Creatinine Clearance: 60 mL/min (A) (by C-G formula based on SCr of 1.5 mg/dL (H)). Recent Labs  Lab 02/16/21 0015  WBC 7.0    Liver Function Tests: Recent Labs  Lab 02/16/21 0015  AST 15  ALT 9  ALKPHOS 56  BILITOT 0.4  PROT 8.1  ALBUMIN 3.6   No results for input(s):  LIPASE, AMYLASE in the last 168 hours. No results for input(s): AMMONIA in the last 168 hours.  ABG No results found for: PHART, PCO2ART, PO2ART, HCO3, TCO2, ACIDBASEDEF, O2SAT   Coagulation Profile: Recent Labs  Lab 02/16/21 0015  INR 1.0    Cardiac Enzymes: No results for input(s): CKTOTAL, CKMB, CKMBINDEX, TROPONINI in the last 168 hours.  HbA1C: No results found for: HGBA1C  CBG: Recent Labs  Lab 02/15/21 2350  GLUCAP 108*    Review of Systems:   Unable to obtain secondary to intubation  Past Medical History:  She,  has a past medical history of Hypertension and Stroke (HCC).   Surgical History:   Past Surgical History:  Procedure  Laterality Date   CESAREAN SECTION     THYROIDECTOMY       Social History:   reports that she has never smoked. She has been exposed to tobacco smoke. She has never used smokeless tobacco. She reports current alcohol use. She reports current drug use. Drug: Marijuana.   Family History:  Her family history includes Healthy in her mother.   Allergies Allergies  Allergen Reactions   Labetalol Hcl      Home Medications  Prior to Admission medications   Medication Sig Start Date End Date Taking? Authorizing Provider  acetaminophen (TYLENOL) 500 MG tablet Take 500-1,000 mg by mouth daily as needed.    [provider]  amLODipine (NORVASC) 10 MG tablet Take 10 mg by mouth daily. 08/10/20 08/10/21  [provider]  aspirin 81 MG EC tablet Take 81 mg by mouth daily.    [provider]  atorvastatin (LIPITOR) 80 MG tablet Take 80 mg by mouth daily.    [provider]  cetirizine (ZYRTEC ALLERGY) 10 MG tablet Take 1 tablet (10 mg total) by mouth daily. Patient taking differently: Take 10 mg by mouth daily as needed. 12/18/19   Lamptey, Britta Mccreedy, MD  ergocalciferol (VITAMIN D2) 1.25 MG (50000 UT) capsule Take 50,000 Units by mouth once a week. On wednesdays    [provider]  hydrochlorothiazide (HYDRODIURIL) 25 MG tablet Take 25 mg by mouth daily. 08/10/20 08/10/21  [provider]  lisinopril (ZESTRIL) 40 MG tablet Take 40 mg by mouth daily. 08/30/20   [provider]  metoprolol succinate (TOPROL-XL) 50 MG 24 hr tablet Take 50 mg by mouth 2 (two) times daily. 08/28/19   [provider]  omeprazole (PRILOSEC) 40 MG capsule Take 40 mg by mouth daily.    [provider]  spironolactone (ALDACTONE) 50 MG tablet Take 50 mg by mouth daily. 09/29/19   [provider]     Critical care time: 35 minutes      CRITICAL CARE Performed by: Darcella Gasman Waldo Damian   Total critical care time: 35 minutes  Critical care time  was exclusive of separately billable procedures and treating other patients.  Critical care was necessary to treat or prevent imminent or life-threatening deterioration.  Critical care was time spent personally by me on the following activities: development of treatment plan with patient and/or surrogate as well as nursing, discussions with consultants, evaluation of patient's response to treatment, examination of patient, obtaining history from patient or surrogate, ordering and performing treatments and interventions, ordering and review of laboratory studies, ordering and review of radiographic studies, pulse oximetry and re-evaluation of patient's condition.   Darcella Gasman Larenzo Caples, PA-C [facility] See Amion for pager If no response to pager , please call [phone]  until 7pm After 7:00 pm call Elink  [phone]

## 2021-02-16 NOTE — Anesthesia Preprocedure Evaluation (Addendum)
Anesthesia Evaluation  Patient identified by MRN, date of birth, ID band Patient confused  Preop documentation limited or incomplete due to emergent nature of procedure.  Airway Mallampati: III  TM Distance: <3 FB Neck ROM: Full  Mouth opening: Limited Mouth Opening  Dental  (+) Teeth Intact   Pulmonary neg pulmonary ROS,    Pulmonary exam normal        Cardiovascular hypertension, Pt. on medications and Pt. 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  Limited chart review ([facility] in read only due to daylight savings time)  Reproductive/Obstetrics                            Anesthesia Physical Anesthesia Plan  ASA: 4 and emergent  Anesthesia Plan: General   Post-op Pain Management:    Induction: Intravenous and Rapid sequence  PONV Risk Score and Plan: 3 and Ondansetron, Dexamethasone and Treatment may vary due to age or medical condition  Airway Management Planned: Mask and Oral ETT  Additional Equipment: Arterial line  Intra-op Plan:   Post-operative Plan: Possible Post-op intubation/ventilation  Informed Consent:     Only emergency history available  Plan Discussed with: CRNA  Anesthesia Plan Comments: (Lab Results      Component                Value               Date                      WBC                      7.0                 02/16/2021                HGB                      8.7 (L)             02/16/2021                 HCT                      30.4 (L)            02/16/2021                MCV                      70.0 (L)            02/16/2021                PLT                      339                 02/16/2021  Lab Results      Component                Value               Date                      NA                       136                 02/16/2021                K                        3.9                 02/16/2021                CO2                      26                  02/16/2021                GLUCOSE                  114 (H)             02/16/2021                BUN                      25 (H)              02/16/2021                CREATININE               1.50 (H)            02/16/2021                CALCIUM                  8.8 (L)             02/16/2021                GFRNONAA                 44 (L)              02/16/2021          )        Anesthesia Quick Evaluation

## 2021-02-16 NOTE — Anesthesia Procedure Notes (Addendum)
Procedure Name: Intubation Date/Time: 02/16/2021 2:10 AM Performed by: Edmonia Caprio, CRNA Pre-anesthesia Checklist: Patient identified, Emergency Drugs available, Suction available, Patient being monitored and Timeout performed Patient Re-evaluated:Patient Re-evaluated prior to induction Oxygen Delivery Method: Circle system utilized Preoxygenation: Pre-oxygenation with 100% oxygen Induction Type: IV induction and Rapid sequence Laryngoscope Size: Glidescope and 3 Grade View: Grade I Tube type: Oral Tube size: 7.5 mm Number of attempts: 1 Airway Equipment and Method: Stylet and Video-laryngoscopy Placement Confirmation: ETT inserted through vocal cords under direct vision, positive ETCO2 and breath sounds checked- equal and bilateral Secured at: 23 cm Tube secured with: Tape Dental Injury: Teeth and Oropharynx as per pre-operative assessment

## 2021-02-16 NOTE — ED Notes (Signed)
Activated Code Stroke 

## 2021-02-16 NOTE — Progress Notes (Signed)
Subjective: Interval History:  Pt was not on our list this am.  Nurse called due to agitation and inability to do MRI and elevated BP.  Objective: Vital signs in last 24 hours: Temp:  [96.6 F (35.9 C)-99.3 F (37.4 C)] 99.3 F (37.4 C) (11/06 1200) Pulse Rate:  [66-104] 85 (11/06 1430) Resp:  [14-34] 22 (11/06 1430) BP: (100-181)/(57-105) 138/78 (11/06 1430) SpO2:  [82 %-100 %] 96 % (11/06 1430) Arterial Line BP: (110-185)/(55-89) 161/64 (11/06 1430) FiO2 (%):  [40 %-50 %] 40 % (11/06 1128) Weight:  [112.9 kg] 112.9 kg (11/06 0020)  Intake/Output from previous day: 11/05 0701 - 11/06 0700 In: 823.5 [I.V.:823.5] Out: 100 [Blood:100] Intake/Output this shift: Total I/O In: 693 [I.V.:549.5; IV Piggyback:143.5] Out: -  Nutritional status:  Diet Order             Diet NPO time specified  Diet effective now                  Left gaze preference.  Globally Aphasic but did open/close eyes to command. Moving left side spontaneously.  Lab Results: Recent Labs    02/16/21 0015 02/16/21 0607 02/16/21 0712  WBC 7.0 7.7  --   HGB 8.7* 8.9* 10.5*  HCT 30.4* 30.3* 31.0*  PLT 339 399  --   NA 136 135 138  K 3.9 3.1* 3.0*  CL 103 104  --   CO2 26 24  --   GLUCOSE 114* 151*  --   BUN 25* 16  --   CREATININE 1.50* 1.22*  --   CALCIUM 8.8* 8.7*  --    Lipid Panel Recent Labs    02/16/21 0607  CHOL 155  TRIG 325*  HDL 32*  CHOLHDL 4.8  VLDL 65*  LDLCALC 58    Studies/Results: DG CHEST PORT 1 VIEW  Result Date: 02/16/2021 CLINICAL DATA:  Intubated EXAM: PORTABLE CHEST 1 VIEW COMPARISON:  08/01/2020 chest radiograph. FINDINGS: Endotracheal tube tip is 3.7 cm above the carina. Enteric tube terminates in mediastinum just below the level the level of the carina to the left of midline. Stable cardiomediastinal silhouette with mild cardiomegaly. No pneumothorax. No pleural effusion. Diffuse prominence of the perihilar interstitial markings. IMPRESSION: 1. Enteric tube  terminates in the mediastinum just below the level of the carina to the left of midline, recommend advancing 15 cm. 2. Well-positioned endotracheal tube. 3. Stable mild cardiomegaly with diffuse prominence of the perihilar interstitial markings, favor pulmonary edema and/or atelectasis. These results will be called to the ordering clinician or representative by the Radiologist Assistant, and communication documented in the PACS or [employer]. Electronically Signed   By: Delbert Phenix M.D.   On: 02/16/2021 06:28   CT HEAD CODE STROKE WO CONTRAST  Result Date: 02/16/2021 CLINICAL DATA:  Code stroke.  Aphasia EXAM: CT HEAD WITHOUT CONTRAST TECHNIQUE: Contiguous axial images were obtained from the base of the skull through the vertex without intravenous contrast. COMPARISON:  None. FINDINGS: Brain: There is no mass, hemorrhage or extra-axial collection. The size and configuration of the ventricles and extra-axial CSF spaces are normal. There are old infarcts of the cerebellum, left frontal operculum and right parietal lobe. There is periventricular hypoattenuation compatible with chronic microvascular disease. Vascular: No abnormal hyperdensity of the major intracranial arteries or dural venous sinuses. No intracranial atherosclerosis. Skull: The visualized skull base, calvarium and extracranial soft tissues are normal. Sinuses/Orbits: No fluid levels or advanced mucosal thickening of the visualized paranasal sinuses. No mastoid or  middle ear effusion. The orbits are normal. ASPECTS [facility] Stroke Program Early CT Score) - Ganglionic level infarction (caudate, lentiform nuclei, internal capsule, insula, M1-M3 cortex): 7 - Supraganglionic infarction (M4-M6 cortex): 3 Total score (0-10 with 10 being normal): 10 IMPRESSION: 1. No acute intracranial abnormality. 2. 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 3. ASPECTS is 10. These results were called by telephone at the time of  interpretation on 02/16/2021 at 12:08 am to provider [facility] , who verbally acknowledged these results. Electronically Signed   By: Deatra Robinson M.D.   On: 02/16/2021 00:09   CT ANGIO HEAD NECK W WO CM W PERF (CODE STROKE)  Result Date: 02/16/2021 CLINICAL DATA:  Aphasia.  History of multiple strokes. EXAM: CT ANGIOGRAPHY HEAD AND NECK CT PERFUSION BRAIN TECHNIQUE: Multidetector CT imaging of the head and neck was performed using the standard protocol during bolus administration of intravenous contrast. Multiplanar CT image reconstructions and MIPs were obtained to evaluate the vascular anatomy. Carotid stenosis measurements (when applicable) are obtained utilizing NASCET criteria, using the distal internal carotid diameter as the denominator. Multiphase CT imaging of the brain was performed following IV bolus contrast injection. Subsequent parametric perfusion maps were calculated using RAPID software. CONTRAST:  40mL OMNIPAQUE IOHEXOL 350 MG/ML SOLN COMPARISON:  None. FINDINGS: CTA NECK FINDINGS SKELETON: There is no bony spinal canal stenosis. No lytic or blastic lesion. OTHER NECK: Normal pharynx, larynx and major salivary glands. No cervical lymphadenopathy. Unremarkable thyroid gland. UPPER CHEST: No pneumothorax or pleural effusion. No nodules or masses. AORTIC ARCH: There is no calcific atherosclerosis of the aortic arch. There is no aneurysm, dissection or hemodynamically significant stenosis of the visualized portion of the aorta. Conventional 3 vessel aortic branching pattern. The visualized proximal subclavian arteries are widely patent. RIGHT CAROTID SYSTEM: Normal without aneurysm, dissection or stenosis. LEFT CAROTID SYSTEM: Normal without aneurysm, dissection or stenosis. VERTEBRAL ARTERIES: Left dominant configuration. Both origins are clearly patent. There is no dissection, occlusion or flow-limiting stenosis to the skull base (V1-V3 segments). CTA HEAD FINDINGS POSTERIOR CIRCULATION:  --Vertebral arteries: Normal V4 segments. --Inferior cerebellar arteries: Normal. --Basilar artery: Normal. --Superior cerebellar arteries: Normal. --Posterior cerebral arteries (PCA): Normal. ANTERIOR CIRCULATION: --Intracranial internal carotid arteries: Normal. --Anterior cerebral arteries (ACA): Normal. Both A1 segments are present. Patent anterior communicating artery (a-comm). --Middle cerebral arteries (MCA): There is occlusion of the proximal M2 inferior division of the left middle cerebral artery. The right MCA is unremarkable. VENOUS SINUSES: As permitted by contrast timing, patent. ANATOMIC VARIANTS: None Review of the MIP images confirms the above findings. CT Brain Perfusion Findings: ASPECTS: 10 CBF (<30%) Volume: 6mL Perfusion (Tmax>6.0s) volume: 52mL Mismatch Volume: 16mL Infarction Location:Posterior left MCA territory deficit corresponds to the area supplied by the occluded M2 branch of the left MCA. The other deficit areas in the left cerebellum and right temporal lobe may be artifactual, as there is no visualized treatable occlusion. IMPRESSION: 1. Occlusion of the proximal M2 inferior division of the left middle cerebral artery. 2. Area of ischemia in the posterior left MCA territory corresponding to the area supplied by the occluded vessel. 3. Other areas of perfusion deficit in the left cerebellum and right temporal lobe are less certain. Critical Value/emergent results were called by telephone at the time of interpretation on 02/16/2021 at 1:33 am to provider Dr. Don Perking, who verbally acknowledged these results. Electronically Signed   By: Deatra Robinson M.D.   On: 02/16/2021 01:34    Medications:  Assessment/Plan: Pt with recent thrombectomy left MCA  earlier today. Now with elevated BP maxed out on cleviprex.  Cancel MRI for today. Consider for tomorrow.   Hyralazine prn. Unable to swallow, add outpt meds when possible.     LOS: 0 days   [name] M Bray Vickerman

## 2021-02-16 NOTE — Progress Notes (Signed)
SLP Cancellation Note  Patient Details Name: [name] MRN: [id number] DOB: 1975-08-31   Cancelled treatment:       Reason Eval/Treat Not Completed: Medical issues which prohibited therapy Pt is intubated at this time. Will follow up for speech and swallow evals as appropriate.  [name], M.S., CCC-SLP Speech-Language Pathologist Acute Rehabilitation Services Pager: [phone]   [name] [name] 02/16/2021, 8:44 AM

## 2021-02-17 ENCOUNTER — Encounter (HOSPITAL_COMMUNITY): Payer: Self-pay | Admitting: Interventional Radiology

## 2021-02-17 ENCOUNTER — Inpatient Hospital Stay (HOSPITAL_COMMUNITY): Payer: Medicaid Other

## 2021-02-17 DIAGNOSIS — N179 Acute kidney failure, unspecified: Secondary | ICD-10-CM

## 2021-02-17 DIAGNOSIS — I10 Essential (primary) hypertension: Secondary | ICD-10-CM | POA: Diagnosis not present

## 2021-02-17 DIAGNOSIS — E876 Hypokalemia: Secondary | ICD-10-CM | POA: Diagnosis not present

## 2021-02-17 DIAGNOSIS — I6602 Occlusion and stenosis of left middle cerebral artery: Secondary | ICD-10-CM | POA: Diagnosis not present

## 2021-02-17 LAB — TRIGLYCERIDES: Triglycerides: 216 mg/dL — ABNORMAL HIGH (ref <150)

## 2021-02-17 MED ORDER — HYDRALAZINE HCL 20 MG/ML IJ SOLN
10.0000 mg | INTRAMUSCULAR | Status: DC | PRN
  Administered 2021-02-17: 10 mg via INTRAVENOUS
  Filled 2021-02-17: qty 1

## 2021-02-17 MED ORDER — LABETALOL HCL 5 MG/ML IV SOLN: 10.0000 mg | INTRAVENOUS | Status: DC | PRN

## 2021-02-17 MED ORDER — HYDRALAZINE HCL 20 MG/ML IJ SOLN
10.0000 mg | INTRAMUSCULAR | Status: DC | PRN
  Administered 2021-02-17 – 2021-02-18 (×3): 10 mg via INTRAVENOUS
  Filled 2021-02-17 (×4): qty 1

## 2021-02-17 MED ORDER — ATORVASTATIN CALCIUM 40 MG PO TABS
40.0000 mg | ORAL_TABLET | Freq: Every day | ORAL | Status: DC
  Administered 2021-02-17 – 2021-02-21 (×5): 40 mg via ORAL
  Filled 2021-02-17 (×5): qty 1

## 2021-02-17 MED ORDER — AMLODIPINE BESYLATE 10 MG PO TABS
10.0000 mg | ORAL_TABLET | Freq: Every day | ORAL | Status: DC
  Administered 2021-02-17 – 2021-02-21 (×5): 10 mg via ORAL
  Filled 2021-02-17 (×5): qty 1

## 2021-02-17 MED ORDER — PANTOPRAZOLE SODIUM 40 MG PO TBEC
40.0000 mg | DELAYED_RELEASE_TABLET | Freq: Every day | ORAL | Status: DC
  Administered 2021-02-18 – 2021-02-20 (×3): 40 mg via ORAL
  Filled 2021-02-17 (×3): qty 1

## 2021-02-17 MED ORDER — LISINOPRIL 20 MG PO TABS
20.0000 mg | ORAL_TABLET | Freq: Every day | ORAL | Status: DC
  Administered 2021-02-17 – 2021-02-21 (×5): 20 mg via ORAL
  Filled 2021-02-17 (×5): qty 1

## 2021-02-17 MED ORDER — LORAZEPAM 2 MG/ML IJ SOLN
0.5000 mg | Freq: Once | INTRAMUSCULAR | Status: DC
  Filled 2021-02-17: qty 1

## 2021-02-17 MED ORDER — METOPROLOL TARTRATE 50 MG PO TABS
50.0000 mg | ORAL_TABLET | Freq: Two times a day (BID) | ORAL | Status: DC
  Administered 2021-02-17 – 2021-02-21 (×8): 50 mg via ORAL
  Filled 2021-02-17 (×8): qty 1

## 2021-02-17 MED ORDER — CLEVIDIPINE BUTYRATE 0.5 MG/ML IV EMUL
0.0000 mg/h | INTRAVENOUS | Status: AC
  Administered 2021-02-17: 21 mg/h via INTRAVENOUS
  Filled 2021-02-17: qty 50

## 2021-02-17 MED ORDER — FERROUS GLUCONATE 324 (38 FE) MG PO TABS
324.0000 mg | ORAL_TABLET | Freq: Three times a day (TID) | ORAL | Status: DC
  Administered 2021-02-17 – 2021-02-21 (×13): 324 mg via ORAL
  Filled 2021-02-17 (×15): qty 1

## 2021-02-17 NOTE — Progress Notes (Signed)
NAME:  [name], MRN:  [id number], DOB:  Jan 01, 1976, LOS: 1 ADMISSION DATE:  02/16/2021, CONSULTATION DATE:  02/17/21 REFERRING MD:  Corliss Skains, CHIEF COMPLAINT:  aphasia   History of Present Illness:  [name] is a 45 y.o. F with PMH of HTN and prior CVAs (2020, and 07/2020) who presented to [facility] with aphasia.  She was out of the window for TPA, but CTA showed L parietal ischemia and M2 occlusion.  She was transferred emergently to [facility] hospital for thrombectomy.  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  She was left intubated as Covid-19 test pending.   Pertinent  Medical History   has a past medical history of Hypertension and Stroke (HCC). Thyroid goiter s/p R thyroid lobectomy (2017) Substance use disorder [facility])  Prior tobacco use  HLD Obesity  Significant Hospital Events: Including procedures, antibiotic start and stop dates in addition to other pertinent events   11/6 Presented to [facility], transferred to [facility] for thrombectomy, left intubated for Covid test, on Cleviprex. Extubated. Transiently on dexmed for agitation  11/7 passed swallow. Still on cleviprex, off dexmed   Interim History / Subjective:   Off dexmed  Continues on cleviprex Passed swallow this morning after my exam -- dys 2, whole meds with puree   Objective   Blood pressure (!) 141/73, pulse 98, temperature 99.2 F (37.3 C), temperature source Axillary, resp. rate (!) 28, height 5\' 5"  (1.651 m), SpO2 94 %.    Vent Mode: PSV;CPAP FiO2 (%):  [40 %] 40 % PEEP:  [5 cmH20] 5 cmH20 Pressure Support:  [8 cmH20] 8 cmH20 Plateau Pressure:  [16 cmH20] 16 cmH20   Intake/Output Summary (Last 24 hours) at 02/17/2021 1043 Last data filed at 02/17/2021 0700 Gross per 24 hour  Intake 2308.17 ml  Output 1850 ml  Net 458.17 ml   There were no vitals filed for this visit.  General:  Chronically ill appearing obese  middle aged F reclined in bed NAD  HEENT: NCAT pink mm anicteric sclera  Neuro: Awake. Alert .Spontaneously moving LUE BLE. Not following commands. Aphasic  CV: rrr. S1s2. Holosystolic murmur, cap refill brisk  PULM:  CTAb symmetrical chest expansion even and unlabored  GI: soft round ndnt  Extremities: no acute joint deformity, no cyanosis or clubbing  Skin: c/d/w no rash    Resolved Hospital Problem list   Acute respiratory failure requiring MV   Assessment & Plan:   Acute L MCA infarct with L M2 occlusion s/p thrombectomy  P -per stroke service -due for repeat imaging --MRI deferred 11/6 per neuro due to agitation  -PT/OT/ SLP -aspiration precautions   HTN P -passed swallow so restarting home norvasc.  -wean cleviprex -goal SBP 120-140   AKI, improving -peak 1.5 this admit. Down to 1.22 Looks like in June 2022 was 1.06.  P -trend renal indices, UOP   Hypokalemia P -replace as needed -trend metabolic panel   Anemia, chronic P -trend PRN   Best Practice (right click and "Reselect all SmartList Selections" daily)   Per primary  Labs   CBC: Recent Labs  Lab 02/16/21 0015 02/16/21 0607 02/16/21 0712  WBC 7.0 7.7  --   NEUTROABS 3.7 5.1  --   HGB 8.7* 8.9* 10.5*  HCT 30.4* 30.3* 31.0*  MCV 70.0* 69.0*  --   PLT 339 399  --     Basic Metabolic Panel: Recent Labs  Lab 02/16/21 0015 02/16/21 0607 02/16/21 0712  NA 136 135 138  K 3.9 3.1* 3.0*  CL 103 104  --   CO2 26 24  --   GLUCOSE 114* 151*  --   BUN 25* 16  --   CREATININE 1.50* 1.22*  --   CALCIUM 8.8* 8.7*  --    GFR: Estimated Creatinine Clearance: 73.8 mL/min (A) (by C-G formula based on SCr of 1.22 mg/dL (H)). Recent Labs  Lab 02/16/21 0015 02/16/21 0607  WBC 7.0 7.7    Liver Function Tests: Recent Labs  Lab 02/16/21 0015  AST 15  ALT 9  ALKPHOS 56  BILITOT 0.4  PROT 8.1  ALBUMIN 3.6   No results for input(s): LIPASE, AMYLASE in the last 168 hours. No results for  input(s): AMMONIA in the last 168 hours.  ABG    Component Value Date/Time   PHART 7.391 02/16/2021 0712   PCO2ART 41.2 02/16/2021 0712   PO2ART 70 (L) 02/16/2021 0712   HCO3 25.3 02/16/2021 0712   TCO2 27 02/16/2021 0712   O2SAT 94.0 02/16/2021 0712     Coagulation Profile: Recent Labs  Lab 02/16/21 0015  INR 1.0    Cardiac Enzymes: No results for input(s): CKTOTAL, CKMB, CKMBINDEX, TROPONINI in the last 168 hours.  HbA1C: Hgb A1c MFr Bld  Date/Time Value Ref Range Status  02/16/2021 06:07 AM 5.7 (H) 4.8 - 5.6 % Final    Comment:    (NOTE) Pre diabetes:          5.7%-6.4%  Diabetes:              >6.4%  Glycemic control for   <7.0% adults with diabetes     CBG: Recent Labs  Lab 02/15/21 2350 02/16/21 2001  GLUCAP 108* 125*    CRITICAL CARE Performed by: Lanier Clam   Total critical care time: 38 minutes  Critical care time was exclusive of separately billable procedures and treating other patients.  Critical care was necessary to treat or prevent imminent or life-threatening deterioration.  Critical care was time spent personally by me on the following activities: development of treatment plan with patient and/or surrogate as well as nursing, discussions with consultants, evaluation of patient's response to treatment, examination of patient, obtaining history from patient or surrogate, ordering and performing treatments and interventions, ordering and review of laboratory studies, ordering and review of radiographic studies, pulse oximetry and re-evaluation of patient's condition.  Tessie Fass MSN, AGACNP-BC [facility] Pulmonary/Critical Care Medicine Amion for pager 02/17/2021, 10:43 AM

## 2021-02-17 NOTE — Anesthesia Postprocedure Evaluation (Signed)
Anesthesia Post Note  Patient: [name]  Procedure(s) Performed: IR WITH ANESTHESIA     Patient location during evaluation: SICU Anesthesia Type: General Level of consciousness: sedated Pain management: pain level controlled Vital Signs Assessment: post-procedure vital signs reviewed and stable Respiratory status: patient remains intubated per anesthesia plan Cardiovascular status: stable Postop Assessment: no apparent nausea or vomiting Anesthetic complications: no   No notable events documented.  Last Vitals:  Vitals:   02/17/21 1330 02/17/21 1400  BP: (!) 157/81 (!) 159/87  Pulse: 69 70  Resp: (!) 23 (!) 24  Temp:    SpO2: 97% 97%    Last Pain:  Vitals:   02/17/21 1200  TempSrc: Axillary                 Earl Lites P Arrietty Dercole

## 2021-02-17 NOTE — Progress Notes (Signed)
Referring Physician(s): Code Stroke   Supervising Physician: Julieanne Cotton  Patient Status:  [facility] - In-pt  Chief Complaint:  S/p revascularization of occluded Inf division of Lt MCA achieving a TICI 3 reperfusion. 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 Reopened  with sec pass with combination as above.achieving a TICI 2C .  Reccluded, with unsuccessful x 2 contact aspirations . Final TICI score 2b/2C. Post CT brain Lt basal ganglia blush and mild Lt ant parietal subarachnoid hyperdensity.   Subjective:  Pt laying in bed, NAD. RN and daughter at bedside.  RN states patient moves left extremities and right LE purposefully, but no purposeful movement noted on right UE.  Pt just came back from CT.   Allergies: Labetalol hcl  Medications: Prior to Admission medications   Medication Sig Start Date End Date Taking? Authorizing Provider  acetaminophen (TYLENOL) 500 MG tablet Take 500-1,000 mg by mouth daily as needed.    [provider]  amLODipine (NORVASC) 10 MG tablet Take 10 mg by mouth daily. 08/10/20 08/10/21  [provider]  aspirin 81 MG EC tablet Take 81 mg by mouth daily.    [provider]  atorvastatin (LIPITOR) 80 MG tablet Take 80 mg by mouth daily.    [provider]  cetirizine (ZYRTEC ALLERGY) 10 MG tablet Take 1 tablet (10 mg total) by mouth daily. Patient taking differently: Take 10 mg by mouth daily as needed. 12/18/19   Lamptey, Britta Mccreedy, MD  ergocalciferol (VITAMIN D2) 1.25 MG (50000 UT) capsule Take 50,000 Units by mouth once a week. On wednesdays    [provider]  hydrochlorothiazide (HYDRODIURIL) 25 MG tablet Take 25 mg by mouth daily. 08/10/20 08/10/21  [provider]  lisinopril (ZESTRIL) 40 MG tablet Take 40 mg by mouth daily. 08/30/20   [provider]  metoprolol succinate (TOPROL-XL) 50 MG 24 hr tablet Take 50 mg by mouth 2 (two) times daily. 08/28/19   [provider]  omeprazole  (PRILOSEC) 40 MG capsule Take 40 mg by mouth daily.    [provider]  spironolactone (ALDACTONE) 50 MG tablet Take 50 mg by mouth daily. 09/29/19   [provider]     Vital Signs: BP (!) 141/73   Pulse 98   Temp 99.2 F (37.3 C) (Axillary)   Resp (!) 28   Ht  (1.651 m)   SpO2 94%   BMI 41.42 kg/m   Physical Exam Vitals reviewed.  Constitutional:      General: She is not in acute distress.    Appearance: She is not ill-appearing.  HENT:     Head: Normocephalic and atraumatic.  Cardiovascular:     Rate and Rhythm: Normal rate and regular rhythm.     Comments: Bilateral DP 2+ Pulmonary:     Effort: Pulmonary effort is normal.  Abdominal:     General: Abdomen is flat.     Palpations: Abdomen is soft.  Musculoskeletal:     Cervical back: Normal range of motion and neck supple.  Skin:    General: Skin is warm and dry.     Coloration: Skin is not jaundiced or pale.     Comments: Positive dressing on right groin puncture site. Site is unremarkable with no erythema, edema, tenderness, bleeding or drainage. Dressing clean, dry, and intact.     Neurological:     Mental Status: Mental status is at baseline. She is disoriented.     Comments: Patient aphasic.  Responsive to verbal stimuli,  sometimes obeys simple commands  PERRL  Motor power 4 on L, 3 on R Distal pulses 2+      Imaging: CT HEAD WO CONTRAST ( )  Addendum Date: 02/17/2021   ADDENDUM REPORT: 02/17/2021 10:49 ADDENDUM: Study discussed by telephone with NP DENISE WOLFE on 02/17/2021 at 1040 hours. Electronically Signed   By: Odessa Fleming M.D.   On: 02/17/2021 10:49   Result Date: 02/17/2021 CLINICAL DATA:  45 year old female code stroke presentation with left MCA M2 ELVO. Status post endovascular reperfusion yesterday. EXAM: CT HEAD WITHOUT CONTRAST TECHNIQUE: Contiguous axial images were obtained from the base of the skull through the vertex without intravenous contrast. COMPARISON:   Presentation head CT 02/15/2021 at 2354 hours. FINDINGS: Brain: Confluent cytotoxic edema now throughout the majority of the left MCA territory. The middle MCA division is relatively spared as seen on series 3, image 24. underlying chronic encephalomalacia in the left anterior MCA territory, left inferior frontal gyrus, left caudate. Mild intracranial mass effect with only trace rightward midline shift on series 3, image 21. Mildly effaced left lateral ventricle. No ventriculomegaly. Contralateral right hemisphere chronic encephalomalacia primarily in the posterior right MCA territory or right MCA/PCA watershed is stable. Chronic right cerebellar PICA territory infarct. However, new/increasing hypodensity in the left cerebellum PICA territory (series 3, image 11). And mild mass effect on the left 4th ventricle. Basilar cisterns remain patent. No intracranial hemorrhagic transformation identified. Vascular: No suspicious intracranial vascular hyperdensity. Skull: Motion artifact at the skull base. No acute osseous abnormality identified. Sinuses/Orbits: Mild bilateral ethmoid and sphenoid sinus fluid and mucosal thickening now. Tympanic cavities and mastoids remain clear. Other: No acute orbit or scalp soft tissue finding. IMPRESSION: 1. Relatively large acute Left MCA infarct. Confluent cytotoxic edema. 2. Evidence also of acute Left Cerebellar PICA territory infarct. 3. But no malignant hemorrhagic transformation. Only trace rightward midline shift, and mild mass effect on the left 4th ventricle with no ventriculomegaly. 4. Underlying advanced chronic bilateral cerebral and cerebellar hemisphere ischemia. Electronically Signed: By: Odessa Fleming M.D. On: 02/17/2021 10:35   DG CHEST PORT 1 VIEW  Result Date: 02/16/2021 CLINICAL DATA:  Intubated EXAM: PORTABLE CHEST 1 VIEW COMPARISON:  08/01/2020 chest radiograph. FINDINGS: Endotracheal tube tip is 3.7 cm above the carina. Enteric tube terminates in mediastinum just  below the level the level of the carina to the left of midline. Stable cardiomediastinal silhouette with mild cardiomegaly. No pneumothorax. No pleural effusion. Diffuse prominence of the perihilar interstitial markings. IMPRESSION: 1. Enteric tube terminates in the mediastinum just below the level of the carina to the left of midline, recommend advancing 15 cm. 2. Well-positioned endotracheal tube. 3. Stable mild cardiomegaly with diffuse prominence of the perihilar interstitial markings, favor pulmonary edema and/or atelectasis. These results will be called to the ordering clinician or representative by the Radiologist Assistant, and communication documented in the PACS or [employer]. Electronically Signed   By: Delbert Phenix M.D.   On: 02/16/2021 06:28   ECHOCARDIOGRAM COMPLETE  Result Date: 02/16/2021    ECHOCARDIOGRAM REPORT   Patient Name:   Reva DIANE Radilla Date of Exam: 02/16/2021 Medical Rec #:  [id number]              Height:       65.0 in Accession #:    [id number]             Weight:       248.9 lb Date of Birth:  [birth year]  BSA:          2.171 m Patient Age:    45 years               BP:           160/64 mmHg Patient Gender: F                      HR:           84 bpm. Exam Location:  Inpatient Procedure: 2D Echo, Color Doppler and Cardiac Doppler Indications:    Stroke  History:        Patient has no prior history of Echocardiogram examinations.                 Signs/Symptoms:Shortness of Breath; Risk Factors:Morbid obesity                 and Hypertension.  Sonographer:    Roosvelt Maser RDCS Referring Phys: [phone] MCNEILL P KIRKPATRICK IMPRESSIONS  1. There is mild SAM of the anterior MV leaflet in the setting of severe symmetric LVH. Difficult to assess dynamic gradient from Dopplers, appears to be mild. . Left ventricular ejection fraction, by estimation, is 60 to 65%. The left ventricle has normal function. The left ventricle has no regional wall motion abnormalities. There is  severe left ventricular hypertrophy. Left ventricular diastolic parameters were normal.  2. Right ventricular systolic function is normal. The right ventricular size is normal. Tricuspid regurgitation signal is inadequate for assessing PA pressure.  3. Left atrial size was severely dilated.  4. The mitral valve is normal in structure. Trivial mitral valve regurgitation. No evidence of mitral stenosis.  5. The aortic valve is tricuspid. Aortic valve regurgitation is not visualized. No aortic stenosis is present.  6. Aortic dilatation noted. There is mild dilatation of the ascending aorta, measuring 37 mm.  7. The inferior vena cava is normal in size with greater than 50% respiratory variability, suggesting right atrial pressure of 3 mmHg. FINDINGS  Left Ventricle: There is mild SAM of the anterior MV leaflet in the setting of severe symmetric LVH. Difficult to assess dynamic gradient from Dopplers, appears to be mild. Left ventricular ejection fraction, by estimation, is 60 to 65%. The left ventricle has normal function. The left ventricle has no regional wall motion abnormalities. The left ventricular internal cavity size was normal in size. There is severe left ventricular hypertrophy. Left ventricular diastolic parameters were normal. Right Ventricle: The right ventricular size is normal. No increase in right ventricular wall thickness. Right ventricular systolic function is normal. Tricuspid regurgitation signal is inadequate for assessing PA pressure. Left Atrium: Left atrial size was severely dilated. Right Atrium: Right atrial size was normal in size. Pericardium: There is no evidence of pericardial effusion. Mitral Valve: The mitral valve is normal in structure. Trivial mitral valve regurgitation. No evidence of mitral valve stenosis. Tricuspid Valve: The tricuspid valve is normal in structure. Tricuspid valve regurgitation is trivial. No evidence of tricuspid stenosis. Aortic Valve: The aortic valve is  tricuspid. Aortic valve regurgitation is not visualized. No aortic stenosis is present. Aortic valve mean gradient measures 12.0 mmHg. Aortic valve peak gradient measures 23.2 mmHg. Pulmonic Valve: The pulmonic valve was not well visualized. Pulmonic valve regurgitation is not visualized. No evidence of pulmonic stenosis. Aorta: The aortic root is normal in size and structure and aortic dilatation noted. There is mild dilatation of the ascending aorta, measuring 37 mm. Venous: The inferior vena cava  is normal in size with greater than 50% respiratory variability, suggesting right atrial pressure of 3 mmHg. IAS/Shunts: No atrial level shunt detected by color flow Doppler.  LEFT VENTRICLE PLAX 2D LVIDd:         5.00 cm   Diastology LVIDs:         2.90 cm   LV e' medial:    8.49 cm/s LV PW:         1.70 cm   LV E/e' medial:  18.1 LV IVS:        1.70 cm   LV e' lateral:   10.10 cm/s LVOT diam:     2.30 cm   LV E/e' lateral: 15.2 LVOT Area:     4.15 cm  RIGHT VENTRICLE          IVC RV Basal diam:  3.20 cm  IVC diam: 1.90 cm LEFT ATRIUM              Index        RIGHT ATRIUM           Index LA diam:        4.00 cm  1.84 cm/m   RA Area:     23.70 cm LA Vol (A2C):   122.0 ml 56.20 ml/m  RA Volume:   69.70 ml  32.11 ml/m LA Vol (A4C):   136.0 ml 62.65 ml/m LA Biplane Vol: 130.0 ml 59.88 ml/m  AORTIC VALVE AV Vmax:      241.00 cm/s AV Vmean:     163.000 cm/s AV VTI:       0.404 m AV Peak Grad: 23.2 mmHg AV Mean Grad: 12.0 mmHg  AORTA Ao Root diam: 3.60 cm Ao Asc diam:  3.70 cm MITRAL VALVE MV Area (PHT): 4.49 cm     SHUNTS MV Decel Time: 169 msec     Systemic Diam: 2.30 cm MV E velocity: 154.00 cm/s MV A velocity: 84.60 cm/s MV E/A ratio:  1.82 Dina Rich MD Electronically signed by Dina Rich MD Signature Date/Time: 02/16/2021/4:06:35 PM    Final    CT HEAD CODE STROKE WO CONTRAST  Result Date: 02/16/2021 CLINICAL DATA:  Code stroke.  Aphasia EXAM: CT HEAD WITHOUT CONTRAST TECHNIQUE: Contiguous axial  images were obtained from the base of the skull through the vertex without intravenous contrast. COMPARISON:  None. FINDINGS: Brain: There is no mass, hemorrhage or extra-axial collection. The size and configuration of the ventricles and extra-axial CSF spaces are normal. There are old infarcts of the cerebellum, left frontal operculum and right parietal lobe. There is periventricular hypoattenuation compatible with chronic microvascular disease. Vascular: No abnormal hyperdensity of the major intracranial arteries or dural venous sinuses. No intracranial atherosclerosis. Skull: The visualized skull base, calvarium and extracranial soft tissues are normal. Sinuses/Orbits: No fluid levels or advanced mucosal thickening of the visualized paranasal sinuses. No mastoid or middle ear effusion. The orbits are normal. ASPECTS [facility] Stroke Program Early CT Score) - Ganglionic level infarction (caudate, lentiform nuclei, internal capsule, insula, M1-M3 cortex): 7 - Supraganglionic infarction (M4-M6 cortex): 3 Total score (0-10 with 10 being normal): 10 IMPRESSION: 1. No acute intracranial abnormality. 2. 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 3. ASPECTS is 10. These results were called by telephone at the time of interpretation on 02/16/2021 at 12:08 am to provider [facility] , who verbally acknowledged these results. Electronically Signed   By: Deatra Robinson M.D.   On: 02/16/2021 00:09  CT ANGIO HEAD NECK W WO CM W PERF (CODE STROKE)  Result Date: 02/16/2021 CLINICAL DATA:  Aphasia.  History of multiple strokes. EXAM: CT ANGIOGRAPHY HEAD AND NECK CT PERFUSION BRAIN TECHNIQUE: Multidetector CT imaging of the head and neck was performed using the standard protocol during bolus administration of intravenous contrast. Multiplanar CT image reconstructions and MIPs were obtained to evaluate the vascular anatomy. Carotid stenosis measurements (when applicable) are obtained  utilizing NASCET criteria, using the distal internal carotid diameter as the denominator. Multiphase CT imaging of the brain was performed following IV bolus contrast injection. Subsequent parametric perfusion maps were calculated using RAPID software. CONTRAST:  67mL OMNIPAQUE IOHEXOL 350 MG/ML SOLN COMPARISON:  None. FINDINGS: CTA NECK FINDINGS SKELETON: There is no bony spinal canal stenosis. No lytic or blastic lesion. OTHER NECK: Normal pharynx, larynx and major salivary glands. No cervical lymphadenopathy. Unremarkable thyroid gland. UPPER CHEST: No pneumothorax or pleural effusion. No nodules or masses. AORTIC ARCH: There is no calcific atherosclerosis of the aortic arch. There is no aneurysm, dissection or hemodynamically significant stenosis of the visualized portion of the aorta. Conventional 3 vessel aortic branching pattern. The visualized proximal subclavian arteries are widely patent. RIGHT CAROTID SYSTEM: Normal without aneurysm, dissection or stenosis. LEFT CAROTID SYSTEM: Normal without aneurysm, dissection or stenosis. VERTEBRAL ARTERIES: Left dominant configuration. Both origins are clearly patent. There is no dissection, occlusion or flow-limiting stenosis to the skull base (V1-V3 segments). CTA HEAD FINDINGS POSTERIOR CIRCULATION: --Vertebral arteries: Normal V4 segments. --Inferior cerebellar arteries: Normal. --Basilar artery: Normal. --Superior cerebellar arteries: Normal. --Posterior cerebral arteries (PCA): Normal. ANTERIOR CIRCULATION: --Intracranial internal carotid arteries: Normal. --Anterior cerebral arteries (ACA): Normal. Both A1 segments are present. Patent anterior communicating artery (a-comm). --Middle cerebral arteries (MCA): There is occlusion of the proximal M2 inferior division of the left middle cerebral artery. The right MCA is unremarkable. VENOUS SINUSES: As permitted by contrast timing, patent. ANATOMIC VARIANTS: None Review of the MIP images confirms the above findings.  CT Brain Perfusion Findings: ASPECTS: 10 CBF (<30%) Volume: 51mL Perfusion (Tmax>6.0s) volume: 41mL Mismatch Volume: 47mL Infarction Location:Posterior left MCA territory deficit corresponds to the area supplied by the occluded M2 branch of the left MCA. The other deficit areas in the left cerebellum and right temporal lobe may be artifactual, as there is no visualized treatable occlusion. IMPRESSION: 1. Occlusion of the proximal M2 inferior division of the left middle cerebral artery. 2. Area of ischemia in the posterior left MCA territory corresponding to the area supplied by the occluded vessel. 3. Other areas of perfusion deficit in the left cerebellum and right temporal lobe are less certain. Critical Value/emergent results were called by telephone at the time of interpretation on 02/16/2021 at 1:33 am to provider Dr. Don Perking, who verbally acknowledged these results. Electronically Signed   By: Deatra Robinson M.D.   On: 02/16/2021 01:34    Labs:  CBC: Recent Labs    09/29/20 1158 09/30/20 0428 02/16/21 0015 02/16/21 0607 02/16/21 0712  WBC 4.8 5.5 7.0 7.7  --   HGB 11.0* 11.4* 8.7* 8.9* 10.5*  HCT 35.2* 36.0 30.4* 30.3* 31.0*  PLT 322 339 339 399  --     COAGS: Recent Labs    09/29/20 1158 02/16/21 0015  INR 0.9 1.0  APTT 29 24    BMP: Recent Labs    09/29/20 1158 09/30/20 0428 02/16/21 0015 02/16/21 0607 02/16/21 0712  NA 139 136 136 135 138  K 4.1 3.6 3.9 3.1* 3.0*  CL 103 102  103 104  --   CO2 29 26 26 24   --   GLUCOSE 111* 119* 114* 151*  --   BUN 18 16 25* 16  --   CALCIUM 9.0 9.1 8.8* 8.7*  --   CREATININE 1.18* 1.06* 1.50* 1.22*  --   GFRNONAA 59* >60 44* 56*  --     LIVER FUNCTION TESTS: Recent Labs    09/29/20 1158 02/16/21 0015  BILITOT 0.5 0.4  AST 17 15  ALT 11 9  ALKPHOS 52 56  PROT 7.9 8.1  ALBUMIN 3.6 3.6    Assessment and Plan:  45 y.o. female Code Stroke s/p endovascular revascularization with Dr. 59 on 11/6.   Patient  underwent CT head today which showed:  1. Relatively large acute Left MCA infarct. Confluent cytotoxic edema.  2. Evidence also of acute Left Cerebellar PICA territory infarct.  3. But no malignant hemorrhagic transformation. Only trace rightward midline shift, and mild mass effect on the left 4th ventricle with no ventriculomegaly.  4. Underlying advanced chronic bilateral cerebral and cerebellar hemisphere ischemia.  Patient aphasic, follows simple command sometimes.  Persistent right side weakness, no purposeful movement on right UE.   Further treatment plan per [facility]  Appreciate and agree with the plan.  Please call NIR for questions and concerns.   Electronically Signed: [facility], PA-C 02/17/2021, 11:21 AM   I spent a total of 25 Minutes at the the patient's bedside AND on the patient's hospital floor or unit, greater than 50% of which was counseling/coordinating care for code stroke/ s/p revascularization of Lt MCA with Dr. 13/10/2020 on 02/16/21.   This chart was dictated using voice recognition software.  Despite best efforts to proofread,  errors can occur which can change the documentation meaning.

## 2021-02-17 NOTE — TOC CAGE-AID Note (Signed)
Transition of Care [facility]) - CAGE-AID Screening   Patient Details  Name: [name] MRN: [id number] Date of Birth: April 24, 1975  Transition of Care [facility]) CM/SW Contact:    [name], LCSWA Phone Number: 02/17/2021, 1:49 PM   Clinical Narrative: Pt is unable to participate in Cage Aid, due to deficits impacting verbal language.  [name], MSW, LCSW-A Pronouns:  She/Her/Hers [facility] Clinical Social Worker Direct Number:  [phone] [email or web address]   CAGE-AID Screening: Substance Abuse Screening unable to be completed due to: : Patient unable to participate (Due to deficits impacting verbal language.)             Substance Abuse Education Offered: No

## 2021-02-17 NOTE — Evaluation (Signed)
Clinical/Bedside Swallow Evaluation Patient Details  Name: [name] MRN: [id number] Date of Birth: 1975/07/16  Today's Date: 02/17/2021 Time: SLP Start Time (ACUTE ONLY): 0848 SLP Stop Time (ACUTE ONLY): 0859 SLP Time Calculation (min) (ACUTE ONLY): 11 min  Past Medical History:  Past Medical History:  Diagnosis Date   Hypertension    Stroke [facility])    Past Surgical History:  Past Surgical History:  Procedure Laterality Date   CESAREAN SECTION     THYROIDECTOMY     HPI:  45 yo F with PMHx significant for previous stroke and HTN. CT at [facility] revealed ischemia in the left parietal region and an M2 occlusion and was sent to [facility] for thrombectomy. Intubated for IR surgery 11/6.    Assessment / Plan / Recommendation  Clinical Impression  Pt was seen for a bedside swallow evaluation. Pt awake and alert however low accuracy following simple commands when provided verbal/visual cues and models. SLP unable to complete oral mechanism exam d/t difficulty following directions however pt with adequate natural dentition and oral cavity WFL; reflexive cough strong. SLP assisted pt with oral care completion and trialed ice chips, thin liquid from cup and straw, puree and regular solids. Pt exhibited reduced bolus awareness during feeding however awareness improved once bolus was in oral cavity. Anterior oral spillage observed with thin liquids x1 and prolonged mastication with regular solids however oral phase otherwise intact. Pt exhibited no overt s/s of penetration or aspiration. Recommened Dys2/thin liquid diet to decrease energy expended during oral phase, administering medications whole in puree. SLP will continue to follow for management of diet tolerance and upgraded PO trials as appropriate. SLP Visit Diagnosis: Dysphagia, unspecified (R13.10)    Aspiration Risk  Mild aspiration risk    Diet Recommendation Dysphagia 2 (Fine chop);Thin liquid   Liquid Administration via:  Cup Medication Administration: Whole meds with puree Supervision: Staff to assist with self feeding;Full supervision/cueing for compensatory strategies Compensations: Minimize environmental distractions;Slow rate;Small sips/bites Postural Changes: Seated upright at 90 degrees;Remain upright for at least 30 minutes after po intake    Other  Recommendations Oral Care Recommendations: Oral care BID    Recommendations for follow up therapy are one component of a multi-disciplinary discharge planning process, led by the attending physician.  Recommendations may be updated based on patient status, additional functional criteria and insurance authorization.  Follow up Recommendations Inpatient Rehab      Frequency and Duration min 2x/week  2 weeks       Prognosis Prognosis for Safe Diet Advancement: Good Barriers to Reach Goals: Cognitive deficits;Language deficits      Swallow Study   General Date of Onset: 02/16/21 HPI: 45 yo F with PMHx significant for previous stroke and HTN. CT at [facility] revealed ischemia in the left parietal region and an M2 occlusion and was sent to [facility] for thrombectomy. Intubated for IR surgery 11/6. Type of Study: Bedside Swallow Evaluation Previous Swallow Assessment: n/a Diet Prior to this Study: NPO Temperature Spikes Noted: No Respiratory Status: Room air History of Recent Intubation: Yes Length of Intubations (days): 1 days (for surgery) Date extubated: 02/16/21 Behavior/Cognition: Alert;Cooperative;Requires cueing Oral Cavity Assessment: Within Functional Limits Oral Care Completed by SLP: Yes Oral Cavity - Dentition: Adequate natural dentition Vision: Impaired for self-feeding Self-Feeding Abilities: Needs assist Patient Positioning: Upright in bed Baseline Vocal Quality: Not observed Volitional Cough: Cognitively unable to elicit Volitional Swallow: Able to elicit    Oral/Motor/Sensory Function Overall Oral Motor/Sensory Function:  Other (comment) (unable  to assess)   Ice Chips Ice chips: Within functional limits Presentation: Spoon   Thin Liquid Thin Liquid: Impaired Presentation: Straw;Cup Oral Phase Impairments: Other (comment) (reduced suction with straw) Oral Phase Functional Implications: Right anterior spillage    Nectar Thick Nectar Thick Liquid: Not tested   Honey Thick Honey Thick Liquid: Not tested   Puree Puree: Within functional limits Presentation: Self Fed;Spoon   Solid     Solid: Within functional limits Presentation:  (presented from SLP)     Jeannie Done, SLP-Student  Jeannie Done 02/17/2021,10:24 AM

## 2021-02-17 NOTE — Evaluation (Signed)
Occupational Therapy Evaluation Patient Details Name: [name] MRN: [id number] DOB: April 15, 1975 Today's Date: 02/17/2021   History of Present Illness 45 yo female presents to [facility] on 11/5 with aphasia. CTA shows occlusion of proximal M2 inferior division of L MCA with area of ischemia in posterior L MCA, possible areas of ischemia L cerebellum and R temporal lobe. 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 PMH includes HTN, CVA.   Clinical Impression   PT admitted with L MCA with revascularization. Pt currently with functional limitiations due to the deficits listed below (see OT problem list). Pt currently with R inattention and R side neglect. Pt attempting to don sock when present with no activation of R UE. Pt progressed to [facility] and then chair this session with incontinence.  Pt will benefit from skilled OT to increase their independence and safety with adls and balance to allow discharge CIR.  Call me [facility]       Recommendations for follow up therapy are one component of a multi-disciplinary discharge planning process, led by the attending physician.  Recommendations may be updated based on patient status, additional functional criteria and insurance authorization.   Follow Up Recommendations  Acute inpatient rehab (3hours/day)    Assistance Recommended at Discharge Intermittent Supervision/Assistance  Functional Status Assessment  Patient has had a recent decline in their functional status and demonstrates the ability to make significant improvements in function in a reasonable and predictable amount of time.  Equipment Recommendations  Other (comment) (TBA)    Recommendations for Other Services Rehab consult     Precautions / Restrictions Precautions Precautions: Fall Precaution Comments: R inattention, eyes not crossing midline to  R Restrictions Weight Bearing Restrictions: No      Mobility Bed Mobility Overal bed mobility: Needs Assistance Bed Mobility: Supine to Sit     Supine to sit: Min assist;+2 for physical assistance;+2 for safety/equipment;HOB elevated     General bed mobility comments: min assist +2 for trunk and LE management, especially RLE progression to EOB.    Transfers Overall transfer level: Needs assistance Equipment used: 2 person hand held assist Transfers: Sit to/from Stand;Bed to chair/wheelchair/BSC Sit to Stand: Min assist;+2 physical assistance;+2 safety/equipment     Step pivot transfers: Min assist;From elevated surface;+2 safety/equipment     General transfer comment: min assist +2 for power up, rise, steadying, and pivotal steps to reach recliner. Pivot towards R, Sit <>stand from EOB, BSC, and recliner.      Balance Overall balance assessment: Needs assistance Sitting-balance support: No upper extremity supported;Feet supported Sitting balance-Leahy Scale: Fair     Standing balance support: Bilateral upper extremity supported;During functional activity Standing balance-Leahy Scale: Poor Standing balance comment: reliant on external assist of PT/OT                           ADL either performed or assessed with clinical judgement   ADL Overall ADL's : Needs assistance/impaired Eating/Feeding: NPO               Upper Body Dressing : Moderate assistance   Lower Body Dressing: Maximal assistance Lower Body Dressing Details (indicate cue type and reason): attempting to place sock on foot and demonstrates R LE figure 4 supine. pt sitting for L LE and attempting to bend over with L hand only use.  Toilet Transfer Details (indicate cue type and reason): see transfer below Toileting- Architect and Hygiene: Total assistance         General ADL Comments: pt progressed to [facility] with incontinence.     Vision   Vision Assessment?:  Yes;Vision impaired- to be further tested in functional context Ocular Range of Motion: Restricted on the right Alignment/Gaze Preference: Gaze left Tracking/Visual Pursuits: Impaired - to be further tested in functional context Additional Comments: pt does not reach midline with visual scanning. when attempting to cue patient she continues to look further to the L and turning core toward L even in response. pt with physical (A) to place head in neutral with L gaze     Perception     Praxis      Pertinent Vitals/Pain Pain Assessment: No/denies pain Faces Pain Scale: No hurt     Hand Dominance  (using L during session)   Extremity/Trunk Assessment Upper Extremity Assessment Upper Extremity Assessment: RUE deficits/detail RUE Deficits / Details: not following commands with R UE but when cued to exit the bed. pt activates R UE and reaching to exit bed and pushing down into the bed surface. pt pulling hand off lap during session when EOB. All observed movement was more for functional task RUE Sensation: decreased light touch;decreased proprioception RUE Coordination: decreased fine motor;decreased gross motor   Lower Extremity Assessment Lower Extremity Assessment: Defer to PT evaluation RLE Deficits / Details: difficult to assess given inattention; observed performing hip and knee flexion to attempt to don sock, otherwise does not move RLE on commands. Full PROM RLE RLE Sensation: decreased proprioception;decreased light touch (withdrawals to pain RLE)   Cervical / Trunk Assessment Cervical / Trunk Assessment: Normal   Communication Communication Communication: Receptive difficulties;Expressive difficulties   Cognition Arousal/Alertness: Awake/alert Behavior During Therapy: Flat affect Overall Cognitive Status: Difficult to assess Area of Impairment: Following commands                       Following Commands: Follows one step commands with increased time        General Comments: following 1 step commands on L side of body and reaching with L UE on command for object. Pt nods "yes" and states x1 "yeah"     General Comments  VSS    Exercises     Shoulder Instructions      Home Living Family/patient expects to be discharged to:: Private residence Living Arrangements: Alone Available Help at Discharge: Available PRN/intermittently Type of Home:  (unable to determine)                           Additional Comments: RN reports daughter states "i will have to go to her house" - uncertain (A) level for d/c home and no family present. pt poor historian. Daughter reports to RN staff 9th CVA  Lives With:  (unable to determine)    Prior Functioning/Environment Prior Level of Function : Patient poor historian/Family not available             Mobility Comments: per RN, pt lives by herself but daughter lives closeby. Unsure of family support once d/c, continuing to assess as no family present at bedside          OT Problem List: Decreased strength;Decreased activity tolerance;Impaired balance (sitting and/or standing);Decreased coordination;Decreased cognition;Decreased safety awareness;Decreased knowledge of use of DME or AE;Decreased knowledge of precautions;Obesity;Impaired UE functional use  OT Treatment/Interventions: Self-care/ADL training;Therapeutic exercise;Neuromuscular education;DME and/or AE instruction;Manual therapy;Modalities;Therapeutic activities;Cognitive remediation/compensation;Visual/perceptual remediation/compensation;Patient/family education;Balance training    OT Goals(Current goals can be found in the care plan section) Acute Rehab OT Goals Patient Stated Goal: none stated by patient OT Goal Formulation: Patient unable to participate in goal setting Time For Goal Achievement: 03/03/21 Potential to Achieve Goals: Good  OT Frequency: Min 2X/week   Barriers to D/C: Decreased caregiver support           Co-evaluation PT/OT/SLP Co-Evaluation/Treatment: Yes Reason for Co-Treatment: For patient/therapist safety;To address functional/ADL transfers PT goals addressed during session: Mobility/safety with mobility;Balance;Strengthening/ROM OT goals addressed during session: ADL's and self-care;Proper use of Adaptive equipment and DME;Strengthening/ROM      AM-PAC OT "6 Clicks" Daily Activity     Outcome Measure Help from another person eating meals?: A Lot Help from another person taking care of personal grooming?: A Lot Help from another person toileting, which includes using toliet, bedpan, or urinal?: A Lot Help from another person bathing (including washing, rinsing, drying)?: A Lot Help from another person to put on and taking off regular upper body clothing?: A Lot Help from another person to put on and taking off regular lower body clothing?: A Lot 6 Click Score: 12   End of Session Equipment Utilized During Treatment: Gait belt Nurse Communication: Mobility status;Precautions  Activity Tolerance: Patient tolerated treatment well Patient left: in chair;with call bell/phone within reach;with chair alarm set;with restraints reapplied;with nursing/sitter in room (pose belt applied)  OT Visit Diagnosis: Unsteadiness on feet (R26.81);Muscle weakness (generalized) (M62.81)                Time: 1040-1108 OT Time Calculation (min): 28 min Charges:  OT General Charges $OT Visit: 1 Visit OT Evaluation $OT Eval Moderate Complexity: 1 Mod   Brynn, OTR/L  Acute Rehabilitation Services Pager: [phone] Office: [phone] .   Mateo Flow 02/17/2021, 12:31 PM

## 2021-02-17 NOTE — Evaluation (Signed)
Physical Therapy Evaluation Patient Details Name: [name] MRN: [id number] DOB: 01/15/1976 Today's Date: 02/17/2021  History of Present Illness  45 yo female presents to [facility] on 11/5 with aphasia. CTA shows occlusion of proximal M2 inferior division of L MCA with area of ischemia in posterior L MCA, possible areas of ischemia L cerebellum and R temporal lobe. 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 PMH includes HTN, CVA.  Clinical Impression   Pt presents with R inattention without crossing midline with eyes to R, suspected R sensory deficits, impaired balance with preference for R lateral leaning, impaired mobility, and aphasia. Pt to benefit from acute PT to address deficits. Pt overall requiring min +2 assist for short distance gait to recliner today, but requires step-by-step, mod cuing throughout mobility. Pt follows commands well on L with one-step cues, no command following observed on R. PT to progress mobility as tolerated, and will continue to follow acutely.         Recommendations for follow up therapy are one component of a multi-disciplinary discharge planning process, led by the attending physician.  Recommendations may be updated based on patient status, additional functional criteria and insurance authorization.  Follow Up Recommendations Acute inpatient rehab (3hours/day)    Assistance Recommended at Discharge Frequent or constant Supervision/Assistance  Functional Status Assessment Patient has had a recent decline in their functional status and demonstrates the ability to make significant improvements in function in a reasonable and predictable amount of time.  Equipment Recommendations  None recommended by PT    Recommendations for Other Services       Precautions / Restrictions Precautions Precautions: Fall Precaution Comments: R  inattention, eyes not crossing midline to R Restrictions Weight Bearing Restrictions: No      Mobility  Bed Mobility Overal bed mobility: Needs Assistance Bed Mobility: Supine to Sit     Supine to sit: Min assist;+2 for physical assistance;+2 for safety/equipment;HOB elevated     General bed mobility comments: min assist +2 for trunk and LE management, especially RLE progression to EOB.    Transfers Overall transfer level: Needs assistance Equipment used: 2 person hand held assist Transfers: Sit to/from Stand;Bed to chair/wheelchair/BSC Sit to Stand: Min assist;+2 physical assistance;+2 safety/equipment   Step pivot transfers: Min assist;From elevated surface;+2 safety/equipment       General transfer comment: min assist +2 for power up, rise, steadying, and pivotal steps to reach recliner. Pivot towards R, STS x3 from EOB, BSC, and recliner.    Ambulation/Gait Ambulation/Gait assistance: Min assist;+2 physical assistance;+2 safety/equipment Gait Distance (Feet): 5 Feet Assistive device: 2 person hand held assist Gait Pattern/deviations: Step-through pattern;Decreased stride length;Trunk flexed;Drifts right/left;Wide base of support Gait velocity: decr     General Gait Details: assist to steady, guide pt to recliner, lines/leads management  Stairs            Wheelchair Mobility    Modified Rankin (Stroke Patients Only) Modified Rankin (Stroke Patients Only) Pre-Morbid Rankin Score: No symptoms Modified Rankin: Moderately severe disability     Balance Overall balance assessment: Needs assistance Sitting-balance support: No upper extremity supported;Feet supported Sitting balance-Leahy Scale: Fair     Standing balance support: Bilateral upper extremity supported;During functional activity Standing balance-Leahy Scale: Poor Standing balance comment: reliant on external assist of PT/OT  Pertinent Vitals/Pain Pain  Assessment: No/denies pain Faces Pain Scale: No hurt    Home Living Family/patient expects to be discharged to:: Private residence Living Arrangements: Alone;Children Available Help at Discharge: Family Type of Home:  (unable to determine)                  Prior Function               Mobility Comments: per RN, pt lives by herself but daughter lives closeby. Unsure of family support once d/c, continuing to assess as no family present at bedside       Hand Dominance        Extremity/Trunk Assessment   Upper Extremity Assessment Upper Extremity Assessment: Defer to OT evaluation    Lower Extremity Assessment Lower Extremity Assessment: RLE deficits/detail RLE Deficits / Details: difficult to assess given inattention; observed performing hip and knee flexion to attempt to don sock, otherwise does not move RLE on commands. Full PROM RLE RLE Sensation: decreased proprioception;decreased light touch (withdrawals to pain RLE)    Cervical / Trunk Assessment Cervical / Trunk Assessment: Normal  Communication   Communication: Receptive difficulties;Expressive difficulties  Cognition Arousal/Alertness: Awake/alert Behavior During Therapy: Flat affect Overall Cognitive Status: Difficult to assess Area of Impairment: Following commands                       Following Commands: Follows one step commands with increased time       General Comments: following 1 step commands on L side of body and reaching with L UE on command for object. Pt nods "yes" and states x1 "yeah"        General Comments General comments (skin integrity, edema, etc.): vss    Exercises     Assessment/Plan    PT Assessment Patient needs continued PT services  PT Problem List Decreased strength;Decreased mobility;Decreased safety awareness;Decreased activity tolerance;Decreased balance;Decreased knowledge of use of DME;Decreased cognition;Decreased coordination;Impaired sensation        PT Treatment Interventions DME instruction;Therapeutic activities;Gait training;Therapeutic exercise;Patient/family education;Balance training;Functional mobility training;Neuromuscular re-education    PT Goals (Current goals can be found in the Care Plan section)  Acute Rehab PT Goals PT Goal Formulation: With patient Time For Goal Achievement: 03/03/21 Potential to Achieve Goals: Good    Frequency Min 4X/week   Barriers to discharge        Co-evaluation PT/OT/SLP Co-Evaluation/Treatment: Yes Reason for Co-Treatment: For patient/therapist safety;To address functional/ADL transfers;Complexity of the patient's impairments (multi-system involvement) PT goals addressed during session: Mobility/safety with mobility;Balance;Strengthening/ROM OT goals addressed during session: ADL's and self-care;Proper use of Adaptive equipment and DME;Strengthening/ROM       AM-PAC PT "6 Clicks" Mobility  Outcome Measure Help needed turning from your back to your side while in a flat bed without using bedrails?: A Lot Help needed moving from lying on your back to sitting on the side of a flat bed without using bedrails?: A Lot Help needed moving to and from a bed to a chair (including a wheelchair)?: A Lot Help needed standing up from a chair using your arms (e.g., wheelchair or bedside chair)?: A Lot Help needed to walk in hospital room?: A Lot Help needed climbing 3-5 steps with a railing? : Total 6 Click Score: 11    End of Session Equipment Utilized During Treatment: Gait belt Activity Tolerance: Patient tolerated treatment well Patient left: with call bell/phone within reach;in chair;with chair alarm set;with family/visitor present;with restraints reapplied (waist belt restraint)  Nurse Communication: Mobility status PT Visit Diagnosis: Other abnormalities of gait and mobility (R26.89);Muscle weakness (generalized) (M62.81);Hemiplegia and hemiparesis Hemiplegia - Right/Left:  Right Hemiplegia - dominant/non-dominant: Dominant Hemiplegia - caused by: Cerebral infarction    Time: 9381-8299 PT Time Calculation (min) (ACUTE ONLY): 30 min   Charges:   PT Evaluation $PT Eval Moderate Complexity: 1 Mod        Doctor Sheahan S, PT DPT Acute Rehabilitation Services Pager [phone]  Office [phone]   Nancee Brownrigg E Christain [place] 02/17/2021, 12:01 PM

## 2021-02-17 NOTE — Progress Notes (Addendum)
STROKE TEAM PROGRESS NOTE   INTERVAL HISTORY Her daughter is at the bedside.  Patient is lying in the bed in NAD. She is globally aphasic, Right arm is flaccid, left gaze and spontaneously moves right leg. She is on cleviprex gtt, will change SBP goal < 180 and add home meds back. RN at bedside during rounds CT scan of the head done this morning during rounds shows large left MCA cortical infarct with sparing of the basal ganglia.  There appears to be a smaller left PICA infarct as well.  No significant hemorrhagic transformation.  Vitals:   02/17/21 1300 02/17/21 1323 02/17/21 1330 02/17/21 1400  BP: (!) 165/89 (!) 162/97 (!) 157/81 (!) 159/87  Pulse: 70  69 70  Resp: (!) 23  (!) 23 (!) 24  Temp:      TempSrc:      SpO2: 97%  97% 97%  Height:       CBC:  Recent Labs  Lab 02/16/21 0015 02/16/21 0607 02/16/21 0712  WBC 7.0 7.7  --   NEUTROABS 3.7 5.1  --   HGB 8.7* 8.9* 10.5*  HCT 30.4* 30.3* 31.0*  MCV 70.0* 69.0*  --   PLT 339 399  --    Basic Metabolic Panel:  Recent Labs  Lab 02/16/21 0015 02/16/21 0607 02/16/21 0712  NA 136 135 138  K 3.9 3.1* 3.0*  CL 103 104  --   CO2 26 24  --   GLUCOSE 114* 151*  --   BUN 25* 16  --   CREATININE 1.50* 1.22*  --   CALCIUM 8.8* 8.7*  --    Lipid Panel:  Recent Labs  Lab 02/16/21 0607 02/17/21 0647  CHOL 155  --   TRIG 325* 216*  HDL 32*  --   CHOLHDL 4.8  --   VLDL 65*  --   LDLCALC 58  --    HgbA1c:  Recent Labs  Lab 02/16/21 0607  HGBA1C 5.7*   Urine Drug Screen: No results for input(s): LABOPIA, COCAINSCRNUR, LABBENZ, AMPHETMU, THCU, LABBARB in the last 168 hours.  Alcohol Level No results for input(s): ETH in the last 168 hours.  IMAGING past 24 hours CT HEAD WO CONTRAST ( )  Addendum Date: 02/17/2021   ADDENDUM REPORT: 02/17/2021 10:49 ADDENDUM: Study discussed by telephone with NP DENISE WOLFE on 02/17/2021 at 1040 hours. Electronically Signed   By: Odessa Fleming M.D.   On: 02/17/2021 10:49   Result Date:  02/17/2021 CLINICAL DATA:  45 year old female code stroke presentation with left MCA M2 ELVO. Status post endovascular reperfusion yesterday. EXAM: CT HEAD WITHOUT CONTRAST TECHNIQUE: Contiguous axial images were obtained from the base of the skull through the vertex without intravenous contrast. COMPARISON:  Presentation head CT 02/15/2021 at 2354 hours. FINDINGS: Brain: Confluent cytotoxic edema now throughout the majority of the left MCA territory. The middle MCA division is relatively spared as seen on series 3, image 24. underlying chronic encephalomalacia in the left anterior MCA territory, left inferior frontal gyrus, left caudate. Mild intracranial mass effect with only trace rightward midline shift on series 3, image 21. Mildly effaced left lateral ventricle. No ventriculomegaly. Contralateral right hemisphere chronic encephalomalacia primarily in the posterior right MCA territory or right MCA/PCA watershed is stable. Chronic right cerebellar PICA territory infarct. However, new/increasing hypodensity in the left cerebellum PICA territory (series 3, image 11). And mild mass effect on the left 4th ventricle. Basilar cisterns remain patent. No intracranial hemorrhagic transformation identified. Vascular: No suspicious intracranial  vascular hyperdensity. Skull: Motion artifact at the skull base. No acute osseous abnormality identified. Sinuses/Orbits: Mild bilateral ethmoid and sphenoid sinus fluid and mucosal thickening now. Tympanic cavities and mastoids remain clear. Other: No acute orbit or scalp soft tissue finding. IMPRESSION: 1. Relatively large acute Left MCA infarct. Confluent cytotoxic edema. 2. Evidence also of acute Left Cerebellar PICA territory infarct. 3. But no malignant hemorrhagic transformation. Only trace rightward midline shift, and mild mass effect on the left 4th ventricle with no ventriculomegaly. 4. Underlying advanced chronic bilateral cerebral and cerebellar hemisphere ischemia.  Electronically Signed: By: Odessa Fleming M.D. On: 02/17/2021 10:35    PHYSICAL EXAM  Temp:  [98.5 F (36.9 C)-100.9 F (38.3 C)] 99 F (37.2 C) (11/07 1200) Pulse Rate:  [68-98] 70 (11/07 1400) Resp:  [18-31] 24 (11/07 1400) BP: (117-165)/(61-97) 159/87 (11/07 1400) SpO2:  [92 %-100 %] 97 % (11/07 1400) Arterial Line BP: (123-173)/(60-76) 154/60 (11/06 1600)  General - obese middle-aged African-American lady, in no apparent distress.  Ophthalmologic - fundi not visualized due to noncooperation.  Cardiovascular - Regular rhythm and rate.  Mental Status -  She is drowsy but easily aroused. She is globally aphasic, does not follow commands  Cranial Nerves II - XII - II - unable to assess III, IV, VI - left gaze preference.  Will not follow to the right cross midline.  She blinks to threat on the left more than right V - Facial sensation intact bilaterally. VII - Facial movement intact bilaterally.  But diminished on the right VIII - Hearing & vestibular intact bilaterally. X -unable to assess XI - unable to assess XII - Tongue protrusion intact.  Motor Strength - Right arm is flaccid. LUE, LLE and RLE antigravity and moving spontaneously.  Bulk was normal and fasciculations were absent.   Motor Tone - Muscle tone was assessed at the neck and appendages and was normal.  Sensory - Light touch, temperature/pinprick were assessed and were symmetrical.    Coordination - unable to assess  Gait and Station - deferred.   ASSESSMENT/PLAN Ms. Brentley Horrell Clingenpeel is a 45 y.o. female with history of multiple strokes, HTN, HLD, obesity who presented to [facility] ED for aphasia.  Stroke:  Acute large left MCA ischemic infarct s/p thrombectomy left M2 reocclusion, TICI 2B revascularization only..  Etiology likely underlying intracranial left MCA atherosclerosis Code Stroke CT head 1. No acute intracranial abnormality. 2. Old infarcts of the cerebellum, left frontal operculum and right  parietal lobe. 3. ASPECTS is 10. CTA head & neck 1. Occlusion of the proximal M2 inferior division of the left middle cerebral artery. 2. Area of ischemia in the posterior left MCA territory corresponding to the area supplied by the occluded vessel. 3. Other areas of perfusion deficit in the left cerebellum and right temporal lobe are less certain. CT perfusion CBF (<30%) Volume: 47mL. Perfusion (Tmax>6.0s) volume: 86mL. Mismatch Volume: 25mL Cerebral angio -S/P revascularization of occluded Inf division of Lt MCA with x1 pass with 40 mm solitairex retriever and contact aspiration achieving a TICI 3 reperfusion. 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 Reopened  with sec pass with combination as above.achieving a TICI 2C. Reoccluded ,with unsuccessful x 2 contact aspirations . Final TICI score 2b/2C Post IR CT  Lt basal ganglia blush and mild Lt ant parietal subarachnoid hyperdensity Repeat CT head 11/7: 1. Relatively large acute Left MCA infarct. Confluent cytotoxic edema. 2. Evidence also of acute Left Cerebellar PICA territory infarct. 3. But no malignant hemorrhagic transformation.  Only trace rightward midline shift, and mild mass effect on the left 4th ventricle with no ventriculomegaly. 4. Underlying advanced chronic bilateral cerebral and cerebellar hemisphere ischemia. MRI  ordered 2D Echo EF 60-65%. No shunt LDL 58 HgbA1c 5.7 VTE prophylaxis - SCD's    Diet   DIET DYS 2 Room service appropriate? Yes; Fluid consistency: Thin   aspirin 81 mg daily prior to admission, now on aspirin 325 mg daily.  Therapy recommendations:  pending Disposition:  pending  Hypertension Home meds:  norvasc, lisinopril, HCTZ metoprolol, aldactone UnStable On cleviprex gtt Will start back home meds- amlodipine and lisinopril SBP goal < 180 Long-term BP goal normotensive  Hyperlipidemia Home meds:  atorvastatin 80mg , resumed in hospital at half dose  LDL 58, goal < 70 Continue statin at  discharge   Other Stroke Risk Factors  Obesity, Body mass index is 41.42 kg/m., BMI >/= 30 associated with increased stroke risk, recommend weight loss, diet and exercise as appropriate  Hx stroke/TIA  Other Active Problems Dysphagia   - Speech therapy following  AKI  - Cr 1.22. monitor BMP and urine output  Hypokalemia   - K 3.1-replete. Check in am  Anemia   - HGb 8.9. monitor   Hospital day # 1  , NP   STROKE MD NOTE :I have personally obtained history,examined this patient, reviewed notes, independently viewed imaging studies, participated in medical decision making and plan of care.ROS completed by me personally and pertinent positives fully documented  I have made any additions or clarifications directly to the above note. Agree with note above.  Patient presented with aphasia and right hemiplegia due to left M2 occlusion and and underwent partial revascularization with mechanical thrombectomy and was unable to get angioplasty stenting.  Follow-up CT scan shows a large left MCA cortical infarct with sparing of the basal ganglia and a smaller left cerebellar infarct as well.  Recommend close neurological monitoring and blood pressure control as per post intervention protocol.  Mobilize out of bed.  Therapy consults.  Swallow eval.  Resume home medications when able to swallow.  Check MRI later today.  Long discussion with patient and daughter at the bedside and answered questions.  Discussed with Dr. Gevena Mart neuro interventional radiologist.This patient is critically ill and at significant risk of neurological worsening, death and care requires constant monitoring of vital signs, hemodynamics,respiratory and cardiac monitoring, extensive review of multiple databases, frequent neurological assessment, discussion with family, other specialists and medical decision making of high complexity.I have made any additions or clarifications directly to the above note.This critical  care time does not reflect procedure time, or teaching time or supervisory time of PA/NP/Med Resident etc but could involve care discussion time.  I spent 40 minutes of neurocritical care time  in the care of  this patient.      Corliss Skains, MD Medical Director [facility] Pager: [phone] 02/17/2021 4:34 PM  To contact Stroke Continuity provider, please refer to 13/10/2020. After hours, contact General Neurology

## 2021-02-17 NOTE — Progress Notes (Signed)
Inpatient Rehab Admissions Coordinator Note:   Per PT/OT/ST patient was screened for CIR candidacy by Annalaya Wile Luvenia Starch, CCC-SLP. At this time, pt appears to be a potential candidate for CIR. I will place an order for rehab consult for full assessment, per our protocol.  Please contact me any with questions.Wolfgang Phoenix, MS, CCC-SLP Admissions Coordinator [phone] 02/17/21 6:58 PM

## 2021-02-17 NOTE — Evaluation (Signed)
Speech Language Pathology Evaluation Patient Details Name: [name] MRN: [id number] DOB: 09-21-1975 Today's Date: 02/17/2021 Time: 0998-3382 SLP Time Calculation (min) (ACUTE ONLY): 10 min  Problem List:  Patient Active Problem List   Diagnosis Date Noted   Stroke (cerebrum) (HCC) 02/16/2021   Middle cerebral artery embolism, left 02/16/2021   Endotracheally intubated    Hypertensive urgency 09/29/2020   History of ischemic left MCA stroke 09/29/2020   Basal ganglia stroke (HCC) 09/29/2020   Malignant hypertension 09/29/2020   Obesity, Class III, BMI 40-49.9 (morbid obesity) (HCC) 09/29/2020   Hyperlipidemia, mixed 09/29/2020   Past Medical History:  Past Medical History:  Diagnosis Date   Hypertension    Stroke [facility])    Past Surgical History:  Past Surgical History:  Procedure Laterality Date   CESAREAN SECTION     THYROIDECTOMY     HPI:  45 yo F with PMHx significant for previous stroke and HTN. CT at [facility] revealed ischemia in the left parietal region and an M2 occlusion and was sent to [facility] for thrombectomy. Intubated for IR surgery 11/6.   Assessment / Plan / Recommendation Clinical Impression  Pt was seen for a speech-language evaluation. Overall, pt presents with expressive/receptive language deficits impacting verbal language initiation, simple one-step command following, and answering yes/no questions. Pt experiencing right inattention and resulting difficulty locating verbal output from conversation partners on her right. Throughout session, pt exhibited one spontaneous unintelligible vocalization however nonverbal for rest of session. Pt with difficulty following one-step directions impacting completion of oral mechanism exam, however pt with adequate natural dentition and oral cavity WFL. Pt required verbal/visual/tactile cues and models to open mouth however cues ineffective for tongue protrusion, smile, wave, and thumbs up. During feeding, pt  exhibited reduced awareness of spoon and applesauce however awareness improved once bolus entered oral cavity and pt able to complete oral care when provided verbal/visual/tactile cues. Pt not stimulable this date for head nodding/shaking and thus unable to answer yes/no questions at this time. Pt will require further diagnostic treatment to determine extent of cognitive deficits. SLP will continue to follow for therapy targeting expressive/receptive language and cognitive deficits.    SLP Assessment  SLP Visit Diagnosis: Cognitive communication deficit (R41.841);Aphasia (R47.01)    Recommendations for follow up therapy are one component of a multi-disciplinary discharge planning process, led by the attending physician.  Recommendations may be updated based on patient status, additional functional criteria and insurance authorization.    Follow Up Recommendations  Inpatient Rehab    Frequency and Duration min 2x/week  2 weeks      SLP Evaluation Cognition  Overall Cognitive Status: Impaired/Different from baseline Arousal/Alertness: Awake/alert Orientation Level: Other (comment) (unable to respond) Attention: Sustained Sustained Attention: Impaired Sustained Attention Impairment: Functional basic Memory:  (unable to assess) Awareness: Impaired Awareness Impairment: Intellectual impairment;Emergent impairment;Anticipatory impairment Problem Solving: Impaired Problem Solving Impairment: Functional basic;Verbal basic Safety/Judgment: Impaired       Comprehension  Auditory Comprehension Overall Auditory Comprehension: Impaired Yes/No Questions: Impaired (unable to respond) Commands: Impaired One Step Basic Commands: 0-24% accurate Interfering Components: Attention Visual Recognition/Discrimination Discrimination: Not tested Reading Comprehension Reading Status: Not tested    Expression Expression Primary Mode of Expression: Other (comment) (none observed) Verbal  Expression Overall Verbal Expression: Impaired Initiation: Impaired Level of Generative/Spontaneous Verbalization: Word (x1) Interfering Components: Attention Non-Verbal Means of Communication: Not applicable Written Expression Written Expression: Not tested   Oral / Motor  Oral Motor/Sensory Function Overall Oral Motor/Sensory Function: Other (comment) (unable to  assess) Motor Speech Overall Motor Speech: Other (comment) (not observed)   GO           Jeannie Done, SLP-Student          Jeannie Done 02/17/2021, 10:40 AM

## 2021-02-17 NOTE — TOC CAGE-AID Note (Deleted)
Transition of Care [facility]) - CAGE-AID Screening   Patient Details  Name: [name] MRN: [id number] Date of Birth: 01-26-1976  Transition of Care [facility]) CM/SW Contact:    Maecy Podgurski C Tarpley-Carter, LCSWA Phone Number: 02/17/2021, 12:58 PM   Clinical Narrative: Pt is unable to participate in Cage Aid, due to deficits impacting verbal language.  Bassem Bernasconi Tarpley-Carter, MSW, LCSW-A Pronouns:  She/Her/Hers [facility] HealthTransitions of Care Clinical Social Worker Direct Number:  [phone] [email or web address]  CAGE-AID Screening:

## 2021-02-18 ENCOUNTER — Inpatient Hospital Stay (HOSPITAL_COMMUNITY): Payer: Medicaid Other

## 2021-02-18 DIAGNOSIS — I6602 Occlusion and stenosis of left middle cerebral artery: Secondary | ICD-10-CM | POA: Diagnosis not present

## 2021-02-18 LAB — CBC
HCT: 32.4 % — ABNORMAL LOW (ref 36.0–46.0)
Hemoglobin: 9.2 g/dL — ABNORMAL LOW (ref 12.0–15.0)
MCH: 19.2 pg — ABNORMAL LOW (ref 26.0–34.0)
MCHC: 28.4 g/dL — ABNORMAL LOW (ref 30.0–36.0)
MCV: 67.8 fL — ABNORMAL LOW (ref 80.0–100.0)
Platelets: 373 10*3/uL (ref 150–400)
RBC: 4.78 MIL/uL (ref 3.87–5.11)
RDW: 19.2 % — ABNORMAL HIGH (ref 11.5–15.5)
WBC: 8.9 10*3/uL (ref 4.0–10.5)
nRBC: 0 % (ref 0.0–0.2)

## 2021-02-18 LAB — BASIC METABOLIC PANEL
Anion gap: 11 (ref 5–15)
BUN: 7 mg/dL (ref 6–20)
CO2: 22 mmol/L (ref 22–32)
Calcium: 8.9 mg/dL (ref 8.9–10.3)
Chloride: 97 mmol/L — ABNORMAL LOW (ref 98–111)
Creatinine, Ser: 0.9 mg/dL (ref 0.44–1.00)
GFR, Estimated: >60 mL/min (ref >60)
Glucose, Bld: 126 mg/dL — ABNORMAL HIGH (ref 70–99)
Potassium: 3.1 mmol/L — ABNORMAL LOW (ref 3.5–5.1)
Sodium: 130 mmol/L — ABNORMAL LOW (ref 135–145)

## 2021-02-18 MED ORDER — CLOPIDOGREL BISULFATE 75 MG PO TABS
75.0000 mg | ORAL_TABLET | Freq: Every day | ORAL | Status: DC
  Administered 2021-02-18 – 2021-02-21 (×4): 75 mg via ORAL
  Filled 2021-02-18 (×4): qty 1

## 2021-02-18 MED ORDER — POTASSIUM CHLORIDE 10 MEQ/100ML IV SOLN
10.0000 meq | INTRAVENOUS | Status: AC
  Administered 2021-02-18 (×4): 10 meq via INTRAVENOUS
  Filled 2021-02-18 (×4): qty 100

## 2021-02-18 MED ORDER — HYDRALAZINE HCL 20 MG/ML IJ SOLN
10.0000 mg | Freq: Once | INTRAMUSCULAR | Status: AC
Start: 2021-02-18 — End: 2021-02-18
  Administered 2021-02-18: 10 mg via INTRAVENOUS

## 2021-02-18 MED ORDER — ORAL CARE MOUTH RINSE
15.0000 mL | Freq: Two times a day (BID) | OROMUCOSAL | Status: DC
  Administered 2021-02-18 – 2021-02-21 (×7): 15 mL via OROMUCOSAL

## 2021-02-18 MED ORDER — POTASSIUM CHLORIDE CRYS ER 20 MEQ PO TBCR
20.0000 meq | EXTENDED_RELEASE_TABLET | ORAL | Status: AC
  Administered 2021-02-18 (×3): 20 meq via ORAL
  Filled 2021-02-18 (×2): qty 1

## 2021-02-18 MED ORDER — POTASSIUM CHLORIDE CRYS ER 20 MEQ PO TBCR
20.0000 meq | EXTENDED_RELEASE_TABLET | ORAL | Status: DC
  Administered 2021-02-18: 20 meq via ORAL
  Filled 2021-02-18 (×2): qty 1

## 2021-02-18 NOTE — Progress Notes (Signed)
[facility] ADULT ICU REPLACEMENT PROTOCOL   The patient does apply for the [facility] Adult ICU Electrolyte Replacment Protocol based on the criteria listed below:   1.Exclusion criteria: TCTS patients, ECMO patients, and Dialysis patients 2. Is GFR >/= 30 ml/min? Yes.    Patient's GFR today is >60 3. Is SCr </= 2? Yes.   Patient's SCr is 0.90 mg/dL 4. Did SCr increase >/= 0.5 in 24 hours? Yes.   5.Pt's weight >40kg  Yes.   6. Abnormal electrolyte(s): K+ 3.1  7. Electrolytes replaced per protocol 8.  Call MD STAT for K+ </= 2.5, Phos </= 1, or Mag </= 1 Physician:  n/a   [name] 02/18/2021 4:37 AM

## 2021-02-18 NOTE — Progress Notes (Signed)
Physical Therapy Treatment Patient Details Name: [name] MRN: [id number] DOB: 08-03-1975 Today's Date: 02/18/2021   History of Present Illness 45 yo female presents to [facility] on 11/5 with aphasia. CTA shows occlusion of proximal M2 inferior division of L MCA with area of ischemia in posterior L MCA, possible areas of ischemia L cerebellum and R temporal lobe. 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 PMH includes HTN, CVA.    PT Comments    Pt resting upon arrival to room, with pt mother and daughter at bedside. Per pt's family, she can have 24/7 assist at d/c from daughter, who is 29 and takes online college courses. Pt requiring mod physical assist for mobility and x2 room-distance bouts of gait, pt with significant LOB towards R when fatigued requiring PT intervention to correct. Pt requires max cuing for functional tasks and attending to R at this time, observed pt's eyes reaching midline towards R today. PT continuing to recommend CIR post-acutely.    Recommendations for follow up therapy are one component of a multi-disciplinary discharge planning process, led by the attending physician.  Recommendations may be updated based on patient status, additional functional criteria and insurance authorization.  Follow Up Recommendations  Acute inpatient rehab (3hours/day)     Assistance Recommended at Discharge Frequent or constant Supervision/Assistance  Equipment Recommendations  None recommended by PT    Recommendations for Other Services       Precautions / Restrictions Precautions Precautions: Fall Precaution Comments: R inattention, eyes not crossing midline to R Restrictions Weight Bearing Restrictions: No     Mobility  Bed Mobility Overal bed mobility: Needs Assistance Bed Mobility: Supine to Sit     Supine to sit: Mod assist      General bed mobility comments: assist for progression RUE/LE to EOB, scoot hips to EOB.    Transfers Overall transfer level: Needs assistance Equipment used: 2 person hand held assist Transfers: Sit to/from Stand Sit to Stand: Min assist;+2 safety/equipment;+2 physical assistance           General transfer comment: min +2 for power up, steadying, correcting R lateral leaning bias. STS x2, from EOB and BSC over toilet.    Ambulation/Gait Ambulation/Gait assistance: Min assist;Mod assist;+2 safety/equipment Gait Distance (Feet): 12 Feet (x2) Assistive device: 2 person hand held assist Gait Pattern/deviations: Step-through pattern;Decreased stride length;Trunk flexed;Drifts right/left;Wide base of support;Decreased step length - right Gait velocity: decr     General Gait Details: min assist to steady, x1 instance mod asssist to correct R lateral LOB when pt fatigued when walking back into room from bathroom.   Stairs             Wheelchair Mobility    Modified Rankin (Stroke Patients Only) Modified Rankin (Stroke Patients Only) Pre-Morbid Rankin Score: No symptoms Modified Rankin: Moderately severe disability     Balance Overall balance assessment: Needs assistance Sitting-balance support: No upper extremity supported;Feet supported Sitting balance-Leahy Scale: Fair     Standing balance support: Bilateral upper extremity supported;During functional activity Standing balance-Leahy Scale: Poor Standing balance comment: reliant on external assist of PT/OT                            Cognition Arousal/Alertness: Awake/alert Behavior During Therapy: Flat affect Overall Cognitive Status: Difficult to assess  General Comments: pt flapping hands at one point and making hands in a sock puppet open-close fashion instead of using her words, attempts vocalization x2 but unable to understand. pt consistently follows  all 1 step commands        Exercises Other Exercises Other Exercises: facilitating attending to R with LUE finding RUE, manually assisting head turn to R    General Comments General comments (skin integrity, edema, etc.): VSS      Pertinent Vitals/Pain Pain Assessment: Faces Faces Pain Scale: No hurt Facial Expression: Relaxed, neutral Body Movements: Absence of movements Muscle Tension: Relaxed Compliance with ventilator (intubated pts.): N/A Vocalization (extubated pts.): Talking in normal tone or no sound CPOT Total: 0 Pain Intervention(s): Monitored during session    Home Living Family/patient expects to be discharged to:: Private residence Living Arrangements: Alone   Type of Home: House Home Access: Stairs to enter Entrance Stairs-Rails: None Entrance Stairs-Number of Steps: 2   Home Layout: One level Home Equipment: None Additional Comments: has 3 children (19 yr 45 yo 32 yo) kids have been living with grandmother after passing of grandfather. 64 yo dgter taking online college classes and could help provide care    Prior Function            PT Goals (current goals can now be found in the care plan section) Acute Rehab PT Goals PT Goal Formulation: With patient Time For Goal Achievement: 03/03/21 Potential to Achieve Goals: Good Progress towards PT goals: Progressing toward goals    Frequency    Min 4X/week      PT Plan Current plan remains appropriate    Co-evaluation   Reason for Co-Treatment: For patient/therapist safety;To address functional/ADL transfers PT goals addressed during session: Balance;Mobility/safety with mobility OT goals addressed during session: ADL's and self-care;Proper use of Adaptive equipment and DME      AM-PAC PT "6 Clicks" Mobility   Outcome Measure  Help needed turning from your back to your side while in a flat bed without using bedrails?: A Lot Help needed moving from lying on your back to sitting on the side of  a flat bed without using bedrails?: A Lot Help needed moving to and from a bed to a chair (including a wheelchair)?: A Lot Help needed standing up from a chair using your arms (e.g., wheelchair or bedside chair)?: A Lot Help needed to walk in hospital room?: A Lot Help needed climbing 3-5 steps with a railing? : Total 6 Click Score: 11    End of Session Equipment Utilized During Treatment: Gait belt Activity Tolerance: Patient tolerated treatment well Patient left: with call bell/phone within reach;in chair;with chair alarm set;with restraints reapplied (waist belt restraint) Nurse Communication: Mobility status PT Visit Diagnosis: Other abnormalities of gait and mobility (R26.89);Muscle weakness (generalized) (M62.81);Hemiplegia and hemiparesis Hemiplegia - Right/Left: Right Hemiplegia - dominant/non-dominant: Dominant Hemiplegia - caused by: Cerebral infarction     Time: 2025-4270 PT Time Calculation (min) (ACUTE ONLY): 31 min  Charges:  $Therapeutic Activity: 8-22 mins                     Marye Round, PT DPT Acute Rehabilitation Services Pager [phone]  Office [phone]    Tyrone Apple E Christain [place] 02/18/2021, 1:31 PM

## 2021-02-18 NOTE — Progress Notes (Signed)
Occupational Therapy Treatment Patient Details Name: [name] MRN: [id number] DOB: 24-Jun-1975 Today's Date: 02/18/2021   History of present illness 45 yo female presents to [facility] on 11/5 with aphasia. CTA shows occlusion of proximal M2 inferior division of L MCA with area of ischemia in posterior L MCA, possible areas of ischemia L cerebellum and R temporal lobe. 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 PMH includes HTN, CVA.   OT comments  Mother and daughter present this session so home setup updated in flow sheet. 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 Pt has 3 children that are all living with grandmother due to loss of grandfather. Pt's mother states "I think they are scared something was going to happen to me so they wanted to stay with me. It was very traumatic for them." Pt demonstrates transfer to bathroom with void of bladder on BSC over toilet and sink level grooming task. Pt does visually gaze midline with mirror but does not cross midline. Pt attending to grooming task with backward chaining. Pt feeling R UE with L UE this session and pinch at it as if noting sensation changes. Pt is R UE dominant per family. Recommendation for CIR. Daughter is 12 yo doing online college classes and can stay with patient.    Recommendations for follow up therapy are one component of a multi-disciplinary discharge planning process, led by the attending physician.  Recommendations may be updated based on patient status, additional functional criteria and insurance authorization.    Follow Up Recommendations  Acute inpatient rehab (3hours/day)    Assistance Recommended at Discharge Intermittent Supervision/Assistance  Equipment Recommendations  BSC/3in1    Recommendations for Other Services Rehab consult    Precautions / Restrictions  Precautions Precautions: Fall Precaution Comments: R inattention, eyes not crossing midline to R       Mobility Bed Mobility Overal bed mobility: Needs Assistance Bed Mobility: Supine to Sit     Supine to sit: Mod assist     General bed mobility comments: cues to exit the bed on L side. pt decreased initiation. pt was sleeping on arrival    Transfers                         Balance           Standing balance support: Single extremity supported;During functional activity Standing balance-Leahy Scale: Fair                             ADL either performed or assessed with clinical judgement   ADL Overall ADL's : Needs assistance/impaired     Grooming: Oral care;Wash/dry face;Moderate assistance;Standing Grooming Details (indicate cue type and reason): some drainage from R side of mouth during oral care. pt using brush properly and rinsining mouth with cup                 Toilet Transfer: Moderate assistance;Rolling walker (2 wheels);BSC/3in1   Toileting- Clothing Manipulation and Hygiene: Total assistance Toileting - Clothing Manipulation Details (indicate cue type and reason): given toilet paper and does not initiate at all     Functional mobility during ADLs: Rolling walker (2 wheels);+2 for physical assistance;Moderate assistance General ADL Comments: pt terminates transfer and needs chair immediately so +2 was appropriate at this  time.    Extremity/Trunk Assessment Upper Extremity Assessment Upper Extremity Assessment: RUE deficits/detail RUE Deficits / Details: not following commands. pt demonstrates some shoulder flexion when touching with L UE RUE Sensation: decreased light touch RUE Coordination: decreased fine motor;decreased gross motor            Vision Baseline Vision/History: 0 No visual deficits Alignment/Gaze Preference: Gaze left Additional Comments: pt does come to midline this session but does not cross. ptwith  some neck rotation when attempting to visually track   Perception     Praxis      Cognition Arousal/Alertness: Awake/alert Behavior During Therapy: Flat affect Overall Cognitive Status: Difficult to assess                                 General Comments: pt flapping hands at one point and making hands in a sock muppet open close fashion instead of using her words. pt responding to all 1 step commands          Exercises     Shoulder Instructions       General Comments VSS    Pertinent Vitals/ Pain       Pain Assessment: No/denies pain Faces Pain Scale: No hurt Facial Expression: Relaxed, neutral Body Movements: Absence of movements Muscle Tension: Relaxed Compliance with ventilator (intubated pts.): N/A Vocalization (extubated pts.): Talking in normal tone or no sound CPOT Total: 0  Home Living Family/patient expects to be discharged to:: Private residence Living Arrangements: Alone   Type of Home: House Home Access: Stairs to enter Secretary/administrator of Steps: 2 Entrance Stairs-Rails: None Home Layout: One level     Bathroom Shower/Tub: Producer, television/film/video: Standard     Home Equipment: None   Additional Comments: has 3 children (19 yr 53 yo 42 yo) kids have been living with grandmother after passing of grandfather. 8 yo dgter taking online college classes and could help provide care      Prior Functioning/Environment              Frequency  Min 2X/week        Progress Toward Goals  OT Goals(current goals can now be found in the care plan section)  Progress towards OT goals: Progressing toward goals  Acute Rehab OT Goals Patient Stated Goal: family requesting more therapy for patient OT Goal Formulation: Patient unable to participate in goal setting Time For Goal Achievement: 03/03/21 Potential to Achieve Goals: Good ADL Goals Pt Will Perform Grooming: with min assist;sitting Pt Will Transfer to Toilet:  with mod assist;bedside commode Additional ADL Goal #1: pt will track to midline 50% of session of attempted task Additional ADL Goal #2: Pt will self feed 25% of meal  Plan Discharge plan remains appropriate    Co-evaluation    PT/OT/SLP Co-Evaluation/Treatment: Yes Reason for Co-Treatment: For patient/therapist safety   OT goals addressed during session: ADL's and self-care;Proper use of Adaptive equipment and DME      AM-PAC OT "6 Clicks" Daily Activity     Outcome Measure   Help from another person eating meals?: A Lot Help from another person taking care of personal grooming?: A Lot Help from another person toileting, which includes using toliet, bedpan, or urinal?: A Lot Help from another person bathing (including washing, rinsing, drying)?: A Lot Help from another person to put on and taking off regular upper body clothing?: A Lot Help from another  person to put on and taking off regular lower body clothing?: A Lot 6 Click Score: 12    End of Session Equipment Utilized During Treatment: Gait belt  OT Visit Diagnosis: Unsteadiness on feet (R26.81);Muscle weakness (generalized) (M62.81)   Activity Tolerance Patient tolerated treatment well   Patient Left in chair;with call bell/phone within reach;with chair alarm set;with restraints reapplied;with nursing/sitter in room   Nurse Communication Mobility status;Precautions        Time: 0160-1093 OT Time Calculation (min): 29 min  Charges: OT General Charges $OT Visit: 1 Visit OT Treatments $Self Care/Home Management : 8-22 mins   Brynn, OTR/L  Acute Rehabilitation Services Pager: [phone] Office: [phone] .   Mateo Flow 02/18/2021, 12:16 PM

## 2021-02-18 NOTE — Progress Notes (Signed)
Discussed with primary team -- off cleviprex, dys 2 diet. PCCM will sign off at this time    Please re-engage if we can be of further assistance or if clinical status changes    Tessie Fass MSN, AGACNP-BC [facility] Pulmonary/Critical Care Medicine 02/18/2021, 10:08 AM

## 2021-02-18 NOTE — PMR Pre-admission (Signed)
PMR Admission Coordinator Pre-Admission Assessment  Patient: [name] is an 45 y.o., female MRN: [id number] DOB: 1975-07-24 Height: _0  (165.1 cm) Weight:    Insurance Information HMO:     PPO:      PCP:      IPA:      80/20:      OTHER:  PRIMARY: Medicaid [employer]      Policy#: [id number]      Subscriber: pt CM Name: Apolonio Schneiders      Phone#: [phone]     Fax#: [phone] Pre-Cert#: [id number] auth for CIR given by Apolonio Schneiders with [employer] with updates due to fax listed above on 11/18.      Employer:  Benefits:  Phone #: [phone]     Name:  Eff. Date: 10/12/19     Deduct:       Out of Pocket Max:       Life Max:  CIR: 100%      SNF:  Outpatient:      Co-Pay:  Home Health:       Co-Pay:  DME:      Co-Pay:  Providers:  SECONDARY:       Policy#:      Phone#:   Development worker, community:       Phone#:   The Therapist, art Information Summary" for patients in Inpatient Rehabilitation Facilities with attached "Privacy Act [place] Records" was provided and verbally reviewed with: N/A  Emergency Contact Information Contact Information     Name Relation Home Work Mobile   noble,gail Mother   [phone]   smith,Damiah Daughter   [phone]       Current Medical History  Patient Admitting Diagnosis: L MCA stroke s/p revascularlization  History of Present Illness: Pt is a 45 y/o female with c/o difficulty speaking.  She was evaluated at [facility] by teleneurology before transfer to [facility].  CTA/CTP performed which demonstrated an area of ischemia in the L parietal region and an M2 occlusion.  She was transferred to [facility] on 11/6 for thrombectomy, which reoccluded.  CT revealed old infarcts. Post IR CT revealed Left basal ganglia blush and mild left anterior parietal subarachnoid hyperdensity.  CT on 11/7 revealed large acute L MCA infarct with confluent cytotoxic edema as well as evidence of left cerebellar PICA territory with no hemorrhagic transformation.  Trace  rightward midline shift and mild mass effect on the 4th ventricle.  Echo normal, LDL 58, HgbA1c 5.7.  Initially requiring cleviprex for BP control. Stroke team recommending asa and plavix.  Seroquel at bedtime for agitation.  Therapy evaluations were completed and pt was recommended for CIR.   Complete NIHSS TOTAL: 11  [facility] has been reviewed by the rehabilitation admission coordinator and physician.  Past Medical History  Past Medical History:  Diagnosis Date   Hypertension    Stroke [facility])     Has the patient had major surgery during 100 days prior to admission? Yes  Family History   family history includes Healthy in her mother.  Current Medications  Current Facility-Administered Medications:     stroke: mapping our early stages of recovery book, , Does not apply, Once, Greta Doom, MD   acetaminophen (TYLENOL) tablet 650 mg, 650 mg, Oral, Q4H PRN, 650 mg at 02/20/21 0906 **OR** acetaminophen (TYLENOL) 160 MG/5ML solution 650 mg, 650 mg, Per Tube, Q4H PRN **OR** acetaminophen (TYLENOL) suppository 650 mg, 650 mg, Rectal, Q4H PRN, Greta Doom, MD, 650 mg at 02/17/21 [phone]  amLODipine (NORVASC) tablet 10 mg, 10 mg, Oral, Daily, Bowser, Grace E, NP, 10 mg at 02/21/21 0800   aspirin EC tablet 81 mg, 81 mg, Oral, Daily, Leonie Man, Pramod S, MD, 81 mg at 02/21/21 0759   atorvastatin (LIPITOR) tablet 40 mg, 40 mg, Oral, Daily, Beulah Gandy A, NP, 40 mg at 02/21/21 0800   Chlorhexidine Gluconate Cloth 2 % PADS 6 each, 6 each, Topical, Daily, Greta Doom, MD, 6 each at 02/18/21 1653   clopidogrel (PLAVIX) tablet 75 mg, 75 mg, Oral, Daily, Leonie Man, Pramod S, MD, 75 mg at 02/21/21 0800   ferrous gluconate (FERGON) tablet 324 mg, 324 mg, Oral, TID WC, Chand, Sudham, MD, 324 mg at 02/21/21 0800   hydrALAZINE (APRESOLINE) injection 10 mg, 10 mg, Intravenous, Q4H PRN, Jacky Kindle, MD, 10 mg at 02/18/21 1829   lisinopril (ZESTRIL)  tablet 20 mg, 20 mg, Oral, Daily, Sethi, Pramod S, MD, 20 mg at 02/21/21 0800   LORazepam (ATIVAN) injection 0.5 mg, 0.5 mg, Intravenous, Once, Beulah Gandy A, NP   MEDLINE mouth rinse, 15 mL, Mouth Rinse, BID, Leonie Man, Pramod S, MD, 15 mL at 02/21/21 1059   melatonin tablet 3 mg, 3 mg, Oral, QHS, Khaliqdina, Alferd Patee, MD, 3 mg at 02/20/21 2123   metoprolol tartrate (LOPRESSOR) tablet 50 mg, 50 mg, Oral, BID, Bowser, Grace E, NP, 50 mg at 02/21/21 0535   pantoprazole (PROTONIX) EC tablet 40 mg, 40 mg, Oral, QHS, Sethi, Pramod S, MD, 40 mg at 02/20/21 2123   QUEtiapine (SEROQUEL) tablet 25 mg, 25 mg, Oral, QHS, Garvin Fila, MD, 25 mg at 02/20/21 2123  Patients Current Diet:  Diet Order             DIET DYS 3 Room service appropriate? Yes; Fluid consistency: Thin  Diet effective now                   Precautions / Restrictions Precautions Precautions: Fall Precaution Comments: R neglect, eyes not crossing midline to R, aphasia, SBP <180, apraxia Restrictions Weight Bearing Restrictions: No   Has the patient had 2 or more falls or a fall with injury in the past year? 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 Independent  Indoor Mobility: Did the patient need assistance with walking from room to room (with or without device)? Independent  Stairs: Did the patient need assistance with internal or external stairs (with or without device)? Independent  Functional Cognition: Did the patient need help planning regular tasks such as shopping or remembering to take medications? Independent  Patient Information Are you of Hispanic, Latino/a,or Spanish origin?: A. No, not of Hispanic, Latino/a, or Spanish origin What is your race?: B. Black or African American Do you need or want an interpreter to communicate with a  doctor or health care staff?: 0. No  Patient's Response To:  Health Literacy and Transportation Is the patient able to respond to health literacy and transportation needs?: No Health Literacy - How often do you need to have someone help you when you read instructions, pamphlets, or other written material from your doctor or pharmacy?: Patient unable to respond In the past 12 months, has lack of transportation kept you from medical appointments or from getting medications?: No In the past 12 months, has lack of transportation kept you from meetings, work, or from getting  things needed for daily living?: No  Home Assistive Devices / Equipment Home Equipment: None  Prior Device Use: Indicate devices/aids used by the patient prior to current illness, exacerbation or injury? None of the above  Current Functional Level Cognition  Arousal/Alertness: Awake/alert Overall Cognitive Status: Impaired/Different from baseline Difficult to assess due to: Impaired communication Current Attention Level: Focused Orientation Level: Oriented to person Following Commands: Follows one step commands with increased time Safety/Judgement: Decreased awareness of safety, Decreased awareness of deficits General Comments: respongs to most 1 step commands, Difficulty at the sinkwith all tasks, unsure if it is cog related or vision related Attention: Sustained Sustained Attention: Impaired Sustained Attention Impairment: Functional basic Memory:  (unable to assess) Awareness: Impaired Awareness Impairment: Intellectual impairment, Emergent impairment, Anticipatory impairment Problem Solving: Impaired Problem Solving Impairment: Functional basic, Verbal basic Safety/Judgment: Impaired    Extremity Assessment (includes Sensation/Coordination)  Upper Extremity Assessment: RUE deficits/detail RUE Deficits / Details: not following commands. pt demonstrates some shoulder flexion when touching with L UE RUE  Sensation: decreased light touch RUE Coordination: decreased fine motor, decreased gross motor  Lower Extremity Assessment: Defer to PT evaluation RLE Deficits / Details: difficult to assess given inattention; observed performing hip and knee flexion to attempt to don sock, otherwise does not move RLE on commands. Full PROM RLE RLE Sensation: decreased proprioception, decreased light touch (withdrawals to pain RLE)    ADLs  Overall ADL's : Needs assistance/impaired Eating/Feeding: NPO Grooming: Wash/dry hands, Wash/dry face, Maximal assistance, Standing Grooming Details (indicate cue type and reason): Min A for standing balance, Max A due to difficulty turning sink on and off, finding the running water, initially engaging the RUE Upper Body Dressing : Moderate assistance Lower Body Dressing: Maximal assistance Lower Body Dressing Details (indicate cue type and reason): attempting to place sock on foot and demonstrates R LE figure 4 supine. pt sitting for L LE and attempting to bend over with L hand only use. Toilet Transfer: Moderate assistance, Ambulation Toilet Transfer Details (indicate cue type and reason): Mod A due to difficulty find seat and mild lean towards L side when walking Toileting- Clothing Manipulation and Hygiene: Total assistance Toileting - Clothing Manipulation Details (indicate cue type and reason): given toilet paper and does not initiate at all Functional mobility during ADLs: Moderate assistance General ADL Comments: Pt standing well at sink, weight bearing through RUE. Pt having difficulty seeing object and potentially remembering how to use some objects.    Mobility  Overal bed mobility: Needs Assistance Bed Mobility: Supine to Sit Supine to sit: Mod assist General bed mobility comments: Up in chair    Transfers  Overall transfer level: Needs assistance Equipment used: 1 person hand held assist Transfers: Sit to/from Stand Sit to Stand: Min assist Bed to/from  chair/wheelchair/BSC transfer type:: Step pivot Step pivot transfers: Min assist, +2 safety/equipment General transfer comment: Pt powering up with min A for steadying    Ambulation / Gait / Stairs / Wheelchair Mobility  Ambulation/Gait Ambulation/Gait assistance: Min assist, +2 safety/equipment, Mod assist Gait Distance (Feet): 70 Feet (x 3) Assistive device: 1 person hand held assist Gait Pattern/deviations: Step-through pattern, Decreased stride length, Wide base of support General Gait Details: initially holding L UE and pt stable, sat to rest, then assisted with R UE and pt reaching with L to hold wall, furniture etc as not aware I was on her R side despite cues and my arm encircling her waist. sat to rest again then walked back to room with  approximation of R arm to her body, she could not find her room number on the R until I turned her around so she was facing the opposite direction.  Same thing when trying to help her find the bathroom in her room. Gait velocity: decr Pre-gait activities: standing hip flexion x10 reps x2 sets with tapping/multimodal cues for sequencing    Posture / Balance Balance Overall balance assessment: Needs assistance Sitting-balance support: No upper extremity supported, Feet supported Sitting balance-Leahy Scale: Good Standing balance support: Single extremity supported, During functional activity Standing balance-Leahy Scale: Fair Standing balance comment: static standing without UE support, for any dynamic activity needs UE support    Special needs/care consideration none   Previous Home Environment (from acute therapy documentation) Living Arrangements: Alone  Lives With:  (unable to determine) Available Help at Discharge: Available PRN/intermittently Type of Home: House Home Layout: One level Home Access: Stairs to enter Entrance Stairs-Rails: None Entrance Stairs-Number of Steps: 2 Bathroom Shower/Tub: Multimedia programmer:  Standard Additional Comments: has 3 children (47 yr 101 yo 62 yo) kids have been living with grandmother after passing of grandfather. 32 yo dgter taking online college classes and could help provide care  Discharge Living Setting Plans for Discharge Living Setting: Lives with (comment) (3 teenaged children) Type of Home at Discharge: House Discharge Home Layout: One level Discharge Home Access: Stairs to enter Entrance Stairs-Rails: None Entrance Stairs-Number of Steps: 2 Discharge Bathroom Shower/Tub: Walk-in shower Discharge Bathroom Toilet: Standard Discharge Bathroom Accessibility: Yes How Accessible: Accessible via walker Does the patient have any problems obtaining your medications?: No  Social/Family/Support Systems Patient Roles: Parent Contact Information: 3 teenaged children, oldest is 32 and taking online college courses Anticipated Caregiver: pt's daughter Consulting civil engineer) and mother Baker Janus, main contact) Anticipated Caregiver's Contact Information: Baker Janus [phone]; Eveline Keto [phone] Ability/Limitations of Caregiver: min assist Caregiver Availability: 24/7 Discharge Plan Discussed with Primary Caregiver: Yes Is Caregiver In Agreement with Plan?: Yes Does Caregiver/Family have Issues with Lodging/Transportation while Pt is in Rehab?: No  Goals Patient/Family Goal for Rehab: PT/OT supervision to min assist, SLP min assist Expected length of stay: 16-18 days Pt/Family Agrees to Admission and willing to participate: Yes Program Orientation Provided & Reviewed with Pt/Caregiver Including Roles  & Responsibilities: Yes  Barriers to Discharge: Insurance for SNF coverage  Decrease burden of Care through IP rehab admission: n/a  Possible need for SNF placement upon discharge: not anticipated. Pt with good family support.   Patient Condition: I have reviewed medical records from [facility], spoken with CM, and patient and family member. I met with patient at the bedside and  discussed via phone for inpatient rehabilitation assessment.  Patient will benefit from ongoing PT, OT, and SLP, can actively participate in 3 hours of therapy a day 5 days of the week, and can make measurable gains during the admission.  Patient will also benefit from the coordinated team approach during an Inpatient Acute Rehabilitation admission.  The patient will receive intensive therapy as well as Rehabilitation physician, nursing, social worker, and care management interventions.  Due to bladder management, bowel management, safety, skin/wound care, disease management, medication administration, pain management, and patient education the patient requires 24 hour a day rehabilitation nursing.  The patient is currently min to mod assist with mobility and basic ADLs.  Discharge setting and therapy post discharge at home with home health is anticipated.  Patient has agreed to participate in the Acute Inpatient Rehabilitation Program and will admit today.  Preadmission Screen Completed  By:  Michel Santee, PT, DPT 02/21/2021 11:03 AM ______________________________________________________________________   Discussed status with Dr. Naaman Plummer on 02/21/21  at 11:03 AM  and received approval for admission today.  Admission Coordinator:  Michel Santee, PT, DPT time 02/21/21 Sudie Grumbling 11:03 AM    Assessment/Plan: Diagnosis: left MCA infarct Does the need for close, 24 hr/day Medical supervision in concert with the patient's rehab needs make it unreasonable for this patient to be served in a less intensive setting? Yes Co-Morbidities requiring supervision/potential complications: HTN, aphasia Due to bladder management, bowel management, safety, skin/wound care, disease management, medication administration, pain management, and patient education, does the patient require 24 hr/day rehab nursing? Yes Does the patient require coordinated care of a physician, rehab nurse, PT, OT, and SLP to address physical and  functional deficits in the context of the above medical diagnosis(es)? Yes Addressing deficits in the following areas: balance, endurance, locomotion, strength, transferring, bowel/bladder control, bathing, dressing, feeding, grooming, toileting, cognition, speech, language, swallowing, and psychosocial support Can the patient actively participate in an intensive therapy program of at least 3 hrs of therapy 5 days a week? Yes The potential for patient to make measurable gains while on inpatient rehab is excellent Anticipated functional outcomes upon discharge from inpatient rehab: supervision and min assist PT, supervision and min assist OT, min assist SLP Estimated rehab length of stay to reach the above functional goals is: 16-18 days Anticipated discharge destination: Home 10. Overall Rehab/Functional Prognosis: excellent   MD Signature: Meredith Staggers, MD, [place] 02/21/2021

## 2021-02-18 NOTE — Progress Notes (Signed)
Inpatient Rehab Admissions Coordinator:   Met with patient briefly at bedside.  She is sleeping, and does wake initially to voice but falls back to sleep.  I called her mom and spoke to her about rehab goals (likely supervision to min assist) and estimated length of stay (average 2 weeks).  Family is able to provide 24/7 at discharge from potential CIR.  Will need insurance approval for CIR and that process has been initiated.  Will continue to follow.   Shann Medal, PT, DPT Admissions Coordinator [phone] 02/18/21  3:15 PM

## 2021-02-18 NOTE — Progress Notes (Addendum)
STROKE TEAM PROGRESS NOTE   INTERVAL HISTORY Her daughter and mother are  at the bedside.  Patient is lying in the bed in NAD. She remains globally aphasic, Right arm is flaccid, left gaze and spontaneously moves right leg. She is now off cleviprex gtt,  have added home meds back. RN at bedside during rounds.  Therapy evaluation recommends inpatient rehab Vital signs are stable.  Neurological exam is unchanged.  Vitals:   02/18/21 1100 02/18/21 1200 02/18/21 1300 02/18/21 1400  BP: (!) 141/77 132/75 137/83 123/64  Pulse: 63 60 (!) 58 64  Resp: (!) 21 18 17 19   Temp:  99.1 F (37.3 C)    TempSrc:  Axillary    SpO2: 96% 95% 98% 98%  Height:       CBC:  Recent Labs  Lab 02/16/21 0015 02/16/21 0607 02/16/21 0712 02/18/21 0311  WBC 7.0 7.7  --  8.9  NEUTROABS 3.7 5.1  --   --   HGB 8.7* 8.9* 10.5* 9.2*  HCT 30.4* 30.3* 31.0* 32.4*  MCV 70.0* 69.0*  --  67.8*  PLT 339 399  --  373   Basic Metabolic Panel:  Recent Labs  Lab 02/16/21 0607 02/16/21 0712 02/18/21 0311  NA 135 138 130*  K 3.1* 3.0* 3.1*  CL 104  --  97*  CO2 24  --  22  GLUCOSE 151*  --  126*  BUN 16  --  7  CREATININE 1.22*  --  0.90  CALCIUM 8.7*  --  8.9   Lipid Panel:  Recent Labs  Lab 02/16/21 0607 02/17/21 0647  CHOL 155  --   TRIG 325* 216*  HDL 32*  --   CHOLHDL 4.8  --   VLDL 65*  --   LDLCALC 58  --    HgbA1c:  Recent Labs  Lab 02/16/21 0607  HGBA1C 5.7*   Urine Drug Screen: No results for input(s): LABOPIA, COCAINSCRNUR, LABBENZ, AMPHETMU, THCU, LABBARB in the last 168 hours.  Alcohol Level No results for input(s): ETH in the last 168 hours.  IMAGING past 24 hours No results found.  PHYSICAL EXAM  Temp:  [98.2 F (36.8 C)-99.8 F (37.7 C)] 99.1 F (37.3 C) (11/08 1200) Pulse Rate:  [58-78] 64 (11/08 1400) Resp:  [15-28] 19 (11/08 1400) BP: (123-184)/(64-123) 123/64 (11/08 1400) SpO2:  [93 %-99 %] 98 % (11/08 1400)  General - obese middle-aged African-American lady, in  no apparent distress.  Ophthalmologic - fundi not visualized due to noncooperation.  Cardiovascular - Regular rhythm and rate.  Mental Status -  She is drowsy but easily aroused. She is globally aphasic, does not follow commands  Cranial Nerves II - XII - II - unable to assess III, IV, VI - left gaze preference.  Will not follow to the right cross midline.  She blinks to threat on the left more than right V - Facial sensation intact bilaterally. VII - Facial movement intact bilaterally.  But diminished on the right VIII - Hearing & vestibular intact bilaterally. X -unable to assess XI - unable to assess XII - Tongue protrusion intact.  Motor Strength - Right arm is flaccid. LUE, LLE and RLE antigravity and moving spontaneously.  Bulk was normal and fasciculations were absent.   Motor Tone - Muscle tone was assessed at the neck and appendages and was normal.  Sensory - Light touch, temperature/pinprick were assessed and were symmetrical.    Coordination - unable to assess  Gait and Station -  deferred.   ASSESSMENT/PLAN Ms. Rina Adney Hink is a 45 y.o. female with history of multiple strokes, HTN, HLD, obesity who presented to [facility] ED for aphasia.  Stroke:  Acute large left MCA ischemic infarct s/p thrombectomy left M2 reocclusion, TICI 2B revascularization only..  Etiology likely underlying intracranial left MCA atherosclerosis Code Stroke CT head 1. No acute intracranial abnormality. 2. 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 3. ASPECTS is 10. CTA head & neck 1. Occlusion of the proximal M2 inferior division of the left middle cerebral artery. 2. Area of ischemia in the posterior left MCA territory corresponding to the area supplied by the occluded vessel. 3. Other areas of perfusion deficit in the left cerebellum and right temporal lobe are less certain. CT perfusion CBF (<30%) Volume: 21mL. Perfusion (Tmax>6.0s) volume: 74mL.  Mismatch Volume: 84mL Cerebral angio -S/P revascularization of occluded Inf division of Lt MCA with x1 pass with 40 mm solitairex retriever and contact aspiration achieving a TICI 3 reperfusion. 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 Reopened  with sec pass with combination as above.achieving a TICI 2C. Reoccluded ,with unsuccessful x 2 contact aspirations . Final TICI score 2b/2C Post IR CT  Lt basal ganglia blush and mild Lt ant parietal subarachnoid hyperdensity Repeat CT head 11/7: 1. Relatively large acute Left MCA infarct. Confluent cytotoxic edema. 2. Evidence also of acute Left Cerebellar PICA territory infarct. 3. But no malignant hemorrhagic transformation. Only trace rightward midline shift, and mild mass effect on the left 4th ventricle with no ventriculomegaly. 4. Underlying advanced chronic bilateral cerebral and cerebellar hemisphere ischemia. MRI pending 2D Echo EF 60-65%. No shunt LDL 58 HgbA1c 5.7 VTE prophylaxis - SCD's    Diet   DIET DYS 3 Room service appropriate? Yes; Fluid consistency: Thin   aspirin 81 mg daily prior to admission, now on aspirin 325 mg daily.  Therapy recommendations: CLR  disposition:  pending  Hypertension Home meds:  norvasc, lisinopril, HCTZ metoprolol, aldactone UnStable On cleviprex gtt Will start back home meds- amlodipine and lisinopril SBP goal < 180 Long-term BP goal normotensive  Hyperlipidemia Home meds:  atorvastatin 80mg , resumed in hospital at half dose  LDL 58, goal < 70 Continue statin at discharge   Other Stroke Risk Factors  Obesity, Body mass index is 41.42 kg/m., BMI >/= 30 associated with increased stroke risk, recommend weight loss, diet and exercise as appropriate  Hx stroke/TIA  Other Active Problems Dysphagia   - Speech therapy following  AKI  - Cr 1.22. monitor BMP and urine output  Hypokalemia   - K 3.1-replete. Check in am  Anemia   - HGb 8.9. monitor   Hospital day # 2  .Patient presented with  aphasia and right hemiplegia due to left M2 occlusion and and underwent partial revascularization with mechanical thrombectomy and was unable to get angioplasty stenting.  Follow-up CT scan shows a large left MCA cortical infarct with sparing of the basal ganglia and a smaller left cerebellar infarct as well.  Recommend   Mobilize out of bed.  Therapy consults.  Swallow eval.  Resume home medications .  Check MRI later today.  Add Plavix to aspirin for 3 months given intracranial atherosclerosis likely etiology of her  stroke.  Long discussion with patient`s mother and daughter at the bedside and answered questions.  Transfer to neurology floor bed available..This patient is critically ill and at significant risk of neurological worsening, death and care requires constant monitoring of vital signs,  hemodynamics,respiratory and cardiac monitoring, extensive review of multiple databases, frequent neurological assessment, discussion with family, other specialists and medical decision making of high complexity.I have made any additions or clarifications directly to the above note.This critical care time does not reflect procedure time, or teaching time or supervisory time of PA/NP/Med Resident etc but could involve care discussion time.  I spent 30 minutes of neurocritical care time  in the care of  this patient.      Delia Heady, MD Medical Director [facility] Pager: [phone] 02/18/2021 3:46 PM  To contact Stroke Continuity provider, please refer to [email or web address]. After hours, contact General Neurology

## 2021-02-18 NOTE — Progress Notes (Signed)
Speech Language Pathology Treatment: Dysphagia;Cognitive-Linquistic  Patient Details Name: [name] MRN: [id number] DOB: Aug 16, 1975 Today's Date: 02/18/2021 Time: 4098-1191 SLP Time Calculation (min) (ACUTE ONLY): 26 min  Assessment / Plan / Recommendation Clinical Impression  Pt was seen for therapy targeting swallowing, cognitive, and language goals. Overall, pt alertness increased from previous session and pt intermittently answering yes/no questions gesturally. Pt oriented to self but disoriented to place. Receptively, pt followed one-step simple commands with verbal cues and models x2. SLP provided pt with thin liquids, puree and regular solids. Pt exhibited no overt oral or pharyngeal phase impairments across consistencies however did experience delayed throat clearing x2 throughout remainder of session. During feeding, pt exhibited incoordination and confusion with utensils and required hand-over-hand assistance to use spoon and straw. SLP attempted to elicit automatic speech via counting and playing music from pt's gospel group, however pt not stimulable this date despite verbal/visual/tactile cues. Pt still exhibiting significant right inattention. Pt family educated re: aphasia, apraxia and cognitive deficits following stroke and ways to facilitate language use. SLP will continue to follow pt for management of diet tolerance, cognition, and language facilitation. Recommend Dys3/thin liquid diet, administering medications whole in puree.    HPI HPI: 45 yo F with PMHx significant for previous stroke and HTN. CT at [facility] revealed ischemia in the left parietal region and an M2 occlusion and was sent to [facility] for thrombectomy. Intubated for IR surgery 11/6.      SLP Plan  Continue with current plan of care      Recommendations for follow up therapy are one component of a multi-disciplinary discharge planning process, led by the attending physician.  Recommendations may  be updated based on patient status, additional functional criteria and insurance authorization.    Recommendations  Diet recommendations: Dysphagia 3 (mechanical soft);Thin liquid Liquids provided via: Straw Medication Administration: Whole meds with puree Supervision: Full supervision/cueing for compensatory strategies;Staff to assist with self feeding;Trained caregiver to feed patient Compensations: Minimize environmental distractions;Slow rate;Small sips/bites Postural Changes and/or Swallow Maneuvers: Seated upright 90 degrees                General recommendations: Rehab consult Oral Care Recommendations: Oral care BID Follow Up Recommendations: Acute inpatient rehab (3hours/day) Assistance recommended at discharge: PRN SLP Visit Diagnosis: Cognitive communication deficit (R41.841);Apraxia (R48.2);Aphasia (R47.01) Plan: Continue with current plan of care       GO              Jeannie Done, SLP-Student   Jeannie Done  02/18/2021, 12:41 PM

## 2021-02-19 DIAGNOSIS — I6602 Occlusion and stenosis of left middle cerebral artery: Secondary | ICD-10-CM | POA: Diagnosis not present

## 2021-02-19 MED ORDER — POTASSIUM CHLORIDE 10 MEQ/100ML IV SOLN
10.0000 meq | INTRAVENOUS | Status: AC
  Administered 2021-02-19 (×4): 10 meq via INTRAVENOUS
  Filled 2021-02-19 (×4): qty 100

## 2021-02-19 MED ORDER — QUETIAPINE FUMARATE 25 MG PO TABS
25.0000 mg | ORAL_TABLET | Freq: Once | ORAL | Status: AC
  Administered 2021-02-20: 25 mg via ORAL
  Filled 2021-02-19: qty 1

## 2021-02-19 MED ORDER — MELATONIN 3 MG PO TABS
3.0000 mg | ORAL_TABLET | Freq: Every day | ORAL | Status: DC
  Administered 2021-02-20 (×2): 3 mg via ORAL
  Filled 2021-02-19 (×2): qty 1

## 2021-02-19 MED ORDER — POTASSIUM CHLORIDE CRYS ER 20 MEQ PO TBCR
40.0000 meq | EXTENDED_RELEASE_TABLET | Freq: Once | ORAL | Status: AC
  Administered 2021-02-19: 40 meq via ORAL
  Filled 2021-02-19: qty 2

## 2021-02-19 MED ORDER — ASPIRIN EC 81 MG PO TBEC
81.0000 mg | DELAYED_RELEASE_TABLET | Freq: Every day | ORAL | Status: DC
  Administered 2021-02-19 – 2021-02-21 (×3): 81 mg via ORAL
  Filled 2021-02-19 (×3): qty 1

## 2021-02-19 NOTE — Progress Notes (Signed)
Patient attempting to get out of bed. Not redirectable. Will do soft waist belt with side rails up and give her a one time dose of Seroquel 25mg  PO and melatonin 3mg  once. Hopefully she calms down and we can take off the soft waist belt.  [facility] Pager Number 

## 2021-02-19 NOTE — Progress Notes (Signed)
Physical Therapy Treatment Patient Details Name: [name] MRN: [id number] DOB: 12/01/1975 Today's Date: 02/19/2021   History of Present Illness 45 yo female presents to [facility] on 11/5 with aphasia. CTA shows occlusion of proximal M2 inferior division of L MCA with area of ischemia in posterior L MCA, possible areas of ischemia L cerebellum and R temporal lobe. 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 PMH includes HTN, CVA.    PT Comments    Pt received in supine, agreeable to therapy session but non-verbal mostly and responding with head nods/gestures to most questions, at times stating "okay". Pt impulsive but with improved initiation this session and needing up to minA +2 for dynamic standing tasks at bedside. Pt with good tolerance for standing exercises and reciprocal transfer training for BLE strengthening and up in chair with alarm on at end of session. Plan to progress gait in hallway next session with chair follow for safety. Pt continues to benefit from PT services to progress toward functional mobility goals and is an excellent candidate for AIR. Continue to recommend high intensity post-acute rehab.   Recommendations for follow up therapy are one component of a multi-disciplinary discharge planning process, led by the attending physician.  Recommendations may be updated based on patient status, additional functional criteria and insurance authorization.  Follow Up Recommendations  Acute inpatient rehab (3hours/day)     Assistance Recommended at Discharge Frequent or constant Supervision/Assistance  Equipment Recommendations  None recommended by PT    Recommendations for Other Services       Precautions / Restrictions Precautions Precautions: Fall Precaution Comments: R inattention, eyes not crossing midline to R, aphasia, SBP  <180 Restrictions Weight Bearing Restrictions: No     Mobility  Bed Mobility Overal bed mobility: Needs Assistance Bed Mobility: Supine to Sit     Supine to sit: Min assist     General bed mobility comments: cues to exit the bed on R side, pt sits up to long sit with min guard but needs minA to get rotated toward R and to foot flat.    Transfers Overall transfer level: Needs assistance Equipment used: 1 person hand held assist Transfers: Sit to/from Stand;Bed to chair/wheelchair/BSC Sit to Stand: Min assist;+2 safety/equipment     Step pivot transfers: Min assist;+2 safety/equipment     General transfer comment: pt impulsive to stand prior to IV pole/tele lines being untangled, needs frequent cues to sit down and wait, but once prepared standing well with minA and no LOB with pivotal steps; then performed STS x 5 reps with single UE support from chair    Ambulation/Gait     Pre-gait activities: standing hip flexion x10 reps x2 sets with tapping/multimodal cues for sequencing      Modified Rankin (Stroke Patients Only) Modified Rankin (Stroke Patients Only) Pre-Morbid Rankin Score: No symptoms Modified Rankin: Moderately severe disability     Balance Overall balance assessment: Needs assistance Sitting-balance support: No upper extremity supported;Feet supported Sitting balance-Leahy Scale: Fair     Standing balance support: Single extremity supported;During functional activity Standing balance-Leahy Scale: Fair Standing balance comment: reliant on external assist of PTA, some R lean with single leg stance on R         Cognition Arousal/Alertness: Awake/alert Behavior During Therapy: Flat affect Overall Cognitive Status: Difficult to assess Area of Impairment: Following commands;Safety/judgement;Attention  Current Attention Level: Focused   Following Commands: Follows one step commands with increased time Safety/Judgement: Decreased awareness of  safety     General Comments: Pt impulsively standing up when cued to lift feet up or to wait in chair, possibly some receptive aphasia in addition to expressive aphasia. pt responding to most 1 step commands but needs increased time, and has more difficulty following commands when therapist standing toward her R side.        Exercises Other Exercises Other Exercises: facilitating attending to R with LUE finding RUE, manually assisting head turn to R Other Exercises: STS x 5 reps Other Exercises: standing marching activity (see pre-gait above, billed as TE for BLE strengthening) x20 reps        Pertinent Vitals/Pain Pain Assessment: Faces Pain Score: 0-No pain Faces Pain Scale: Hurts a little bit Breathing: normal Negative Vocalization: none Facial Expression: smiling or inexpressive Body Language: relaxed Consolability: no need to console PAINAD Score: 0 Pain Location: pt shakes head no when PTA asking if she has a headache Pain Intervention(s): Monitored during session;Repositioned     PT Goals (current goals can now be found in the care plan section) Acute Rehab PT Goals PT Goal Formulation: With patient Time For Goal Achievement: 03/03/21 Progress towards PT goals: Progressing toward goals    Frequency    Min 4X/week      PT Plan Current plan remains appropriate       AM-PAC PT "6 Clicks" Mobility   Outcome Measure  Help needed turning from your back to your side while in a flat bed without using bedrails?: A Little Help needed moving from lying on your back to sitting on the side of a flat bed without using bedrails?: A Little Help needed moving to and from a bed to a chair (including a wheelchair)?: A Lot (mod cues) Help needed standing up from a chair using your arms (e.g., wheelchair or bedside chair)?: A Lot (mod cues) Help needed to walk in hospital room?: A Lot (max cues) Help needed climbing 3-5 steps with a railing? : Total 6 Click Score: 13     End of Session Equipment Utilized During Treatment: Gait belt Activity Tolerance: Patient tolerated treatment well Patient left: in chair;with call bell/phone within reach;with chair alarm set Nurse Communication: Mobility status;Other (comment) (pt IV beeping and needs new purewick and sheets) PT Visit Diagnosis: Other abnormalities of gait and mobility (R26.89);Muscle weakness (generalized) (M62.81);Hemiplegia and hemiparesis Hemiplegia - Right/Left: Right Hemiplegia - dominant/non-dominant: Dominant Hemiplegia - caused by: Cerebral infarction     Time: 2336-1224 PT Time Calculation (min) (ACUTE ONLY): 28 min  Charges:  $Therapeutic Exercise: 8-22 mins $Therapeutic Activity: 8-22 mins                     Shekina Cordell P., PTA Acute Rehabilitation Services Pager: [phone] Office: [phone]    Angus Palms 02/19/2021, 5:42 PM

## 2021-02-19 NOTE — Progress Notes (Signed)
Speech Language Pathology Treatment: Dysphagia;Cognitive-Linquistic  Patient Details Name: [name] MRN: [id number] DOB: Sep 21, 1975 Today's Date: 02/19/2021 Time: 1350-1406 SLP Time Calculation (min) (ACUTE ONLY): 16 min  Assessment / Plan / Recommendation Clinical Impression  Pt seen at bedside for skilled ST intervention targeting goals for swallow safety and communication. Pt was resting in bed upon arrival of SLP. No family present. Pt vocalized 3x during this session, but no intelligible words were uttered. Pt was more consistent with answering yes/no questions, and followed several 1-step commands today. Pt appears frustrated with current difficulties. She uses gestures, although inaccurately, frequently placing her fingers at her mouth.   Pt accepted trials of graham cracker and water via straw. She was able to self feed, and did not exhibit overt s/s aspiration. Will continue current diet (dys3/thin).  Recommend continued ST intervention acutely to maximize communicative effectiveness. Anticipate pt will continue to benefit from ST intervention after acute hospitalization.    HPI HPI: [name] with PMHx significant for previous stroke and HTN. CT at [facility] revealed ischemia in the left parietal region and an M2 occlusion and was sent to [facility] for thrombectomy. Intubated for IR surgery 11/6.      SLP Plan  Continue with current plan of care      Recommendations for follow up therapy are one component of a multi-disciplinary discharge planning process, led by the attending physician.  Recommendations may be updated based on patient status, additional functional criteria and insurance authorization.    Recommendations  Diet recommendations: Dysphagia 3 (mechanical soft);Thin liquid Liquids provided via: Straw Medication Administration: Whole meds with puree Supervision: Full supervision/cueing for compensatory strategies;Staff to assist with self feeding;Trained  caregiver to feed patient Compensations: Minimize environmental distractions;Slow rate;Small sips/bites Postural Changes and/or Swallow Maneuvers: Seated upright 90 degrees                Oral Care Recommendations: Oral care BID Follow Up Recommendations: Acute inpatient rehab (3hours/day) Assistance recommended at discharge: PRN SLP Visit Diagnosis: Cognitive communication deficit (R41.841);Apraxia (R48.2);Aphasia (R47.01) Plan: Continue with current plan of care       GO              Ugonna Keirsey B. Murvin Natal, [facility], CCC-SLP Speech Language Pathologist Office: [phone]  Leigh Aurora  02/19/2021, 2:10 PM

## 2021-02-19 NOTE — Progress Notes (Signed)
Inpatient Rehab Admissions Coordinator:   Awaiting insurance determination for CIR prior auth request.  I will not have a bed for this patient to admit to CIR today.  Will continue to follow.   Estill Dooms, PT, DPT Admissions Coordinator [phone] 02/19/21  10:20 AM

## 2021-02-19 NOTE — Progress Notes (Signed)
Received pt from [facility]NICU, alert but non-verbal, implemented MD orders, placed on Tele and verified, call light in reach, no personal items accompanied her.

## 2021-02-19 NOTE — Progress Notes (Signed)
STROKE TEAM PROGRESS NOTE   INTERVAL HISTORY Her daughter and sister are  at the bedside.  Patient is lying in the bed in NAD. She remains globally aphasic, Right arm is flaccid, left gaze and spontaneously moves right leg. She is now able to interact a bit and follows simple gestures and midline commands.  She makes a few guttural sounds but not able to speak clear words or sentences.  Therapy evaluation recommends inpatient rehab Vital signs are stable.  Neurological exam is unchanged.  Vitals:   02/19/21 0635 02/19/21 0741 02/19/21 0832 02/19/21 1145  BP: (!) 148/84 139/76 139/76 (!) 152/78  Pulse: 63 62  62  Resp: 20 19  19   Temp:  98.3 F (36.8 C)  98.4 F (36.9 C)  TempSrc:  Oral  Oral  SpO2: 99% 100%  100%  Height:       CBC:  Recent Labs  Lab 02/16/21 0015 02/16/21 0607 02/16/21 0712 02/18/21 0311  WBC 7.0 7.7  --  8.9  NEUTROABS 3.7 5.1  --   --   HGB 8.7* 8.9* 10.5* 9.2*  HCT 30.4* 30.3* 31.0* 32.4*  MCV 70.0* 69.0*  --  67.8*  PLT 339 399  --  373   Basic Metabolic Panel:  Recent Labs  Lab 02/16/21 0607 02/16/21 0712 02/18/21 0311  NA 135 138 130*  K 3.1* 3.0* 3.1*  CL 104  --  97*  CO2 24  --  22  GLUCOSE 151*  --  126*  BUN 16  --  7  CREATININE 1.22*  --  0.90  CALCIUM 8.7*  --  8.9   Lipid Panel:  Recent Labs  Lab 02/16/21 0607 02/17/21 0647  CHOL 155  --   TRIG 325* 216*  HDL 32*  --   CHOLHDL 4.8  --   VLDL 65*  --   LDLCALC 58  --    HgbA1c:  Recent Labs  Lab 02/16/21 0607  HGBA1C 5.7*   Urine Drug Screen: No results for input(s): LABOPIA, COCAINSCRNUR, LABBENZ, AMPHETMU, THCU, LABBARB in the last 168 hours.  Alcohol Level No results for input(s): ETH in the last 168 hours.  IMAGING past 24 hours MR BRAIN WO CONTRAST  Result Date: 02/18/2021 CLINICAL DATA:  Stroke, follow-up. EXAM: MRI HEAD WITHOUT CONTRAST TECHNIQUE: Multiplanar, multiecho pulse sequences of the brain and surrounding structures were obtained without intravenous  contrast. COMPARISON:  Head CT February 17, 2021. FINDINGS: Brain: Large areas of restricted diffusion involving the left frontotemporal parietal and insular region, lateral aspect of the left occipital lobe and left cerebellar hemisphere. Multiple small foci of restricted diffusion are also seen scattered through the bilateral cerebral hemispheres. There is mild mass effect on the fourth ventricle without hydrocephalus. A 3 mm rightward midline shift is also seen supratentorially. Area of encephalomalacia and gliosis are seen in the left frontal lobe, right temporoparietal region and right cerebellar hemisphere. Remote lacunar infarcts are also seen the bilateral basal ganglia. Scattered and confluent foci of T2 hyperintensity are seen within the white matter of the cerebral hemispheres, nonspecific. Vascular: Susceptibility artifact noted along the left MCA M2 and M3 branches, may represent intraluminal thrombus. Skull and upper cervical spine: Diffuse decrease of the T1 signal within the visualized spine, clivus and calvarium, may represent red marrow reconversion. Sinuses/Orbits: Mucosal thickening of the ethmoid cells and sphenoid sinuses with fluid level within the right sphenoid sinus. Other: None. IMPRESSION: 1. Acute large left MCA and left PICA territory infarcts with mild  mass effect. No evidence of hemorrhagic transformation. 2. Additional multiple small acute infarcts in the bilateral cerebral hemispheres, suggestive of embolic events. 3. Advanced chronic white matter disease. Given recent angiogram findings, this may be related to severe intracranial atherosclerotic disease versus vasculitis. Electronically Signed   By: Baldemar Lenis M.D.   On: 02/18/2021 17:00    PHYSICAL EXAM  Temp:  [97.5 F (36.4 C)-98.4 F (36.9 C)] 98.4 F (36.9 C) (11/09 1145) Pulse Rate:  [54-69] 62 (11/09 1145) Resp:  [18-22] 19 (11/09 1145) BP: (117-155)/(64-95) 152/78 (11/09 1145) SpO2:  [97 %-100  %] 100 % (11/09 1145)  General - obese middle-aged African-American lady, in no apparent distress.  Ophthalmologic - fundi not visualized due to noncooperation.  Cardiovascular - Regular rhythm and rate.  Mental Status -  She is drowsy but easily aroused. She is globally aphasic, does not follow commands  Cranial Nerves II - XII - II - unable to assess III, IV, VI - left gaze preference.  Will not follow to the right cross midline.  She blinks to threat on the left more than right V - Facial sensation intact bilaterally. VII - Facial movement intact bilaterally.  But diminished on the right VIII - Hearing & vestibular intact bilaterally. X -unable to assess XI - unable to assess XII - Tongue protrusion intact.  Motor Strength - Right arm is flaccid. LUE, LLE and RLE antigravity and moving spontaneously.  Bulk was normal and fasciculations were absent.   Motor Tone - Muscle tone was assessed at the neck and appendages and was normal.  Sensory - Light touch, temperature/pinprick were assessed and were symmetrical.    Coordination - unable to assess  Gait and Station - deferred.   ASSESSMENT/PLAN [name] is a 45 y.o. female with history of multiple strokes, HTN, HLD, obesity who presented to [facility] ED for aphasia.  Stroke:  Acute large left MCA ischemic infarct s/p thrombectomy left M2 reocclusion, TICI 2B revascularization only..  Etiology likely underlying intracranial left MCA atherosclerosis Code Stroke CT head 1. No acute intracranial abnormality. 2. 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 3. ASPECTS is 10. CTA head & neck 1. Occlusion of the proximal M2 inferior division of the left middle cerebral artery. 2. Area of ischemia in the posterior left MCA territory corresponding to the area supplied by the occluded vessel. 3. Other areas of perfusion deficit in the left cerebellum and right temporal lobe are less  certain. CT perfusion CBF (<30%) Volume: 47mL. Perfusion (Tmax>6.0s) volume: 26mL. Mismatch Volume: 49mL Cerebral angio -S/P revascularization of occluded Inf division of Lt MCA with x1 pass with 40 mm solitairex retriever and contact aspiration achieving a TICI 3 reperfusion. 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 Reopened  with sec pass with combination as above.achieving a TICI 2C. Reoccluded ,with unsuccessful x 2 contact aspirations . Final TICI score 2b/2C Post IR CT  Lt basal ganglia blush and mild Lt ant parietal subarachnoid hyperdensity Repeat CT head 11/7: 1. Relatively large acute Left MCA infarct. Confluent cytotoxic edema. 2. Evidence also of acute Left Cerebellar PICA territory infarct. 3. But no malignant hemorrhagic transformation. Only trace rightward midline shift, and mild mass effect on the left 4th ventricle with no ventriculomegaly. 4. Underlying advanced chronic bilateral cerebral and cerebellar hemisphere ischemia. MRI pending 2D Echo EF 60-65%. No shunt LDL 58 HgbA1c 5.7 VTE prophylaxis - SCD's    Diet   DIET DYS 3 Room  service appropriate? Yes; Fluid consistency: Thin   aspirin 81 mg daily prior to admission, now on aspirin 81 mg and plavix 75 mg daily.  Therapy recommendations: CLR  disposition:  pending  Hypertension Home meds:  norvasc, lisinopril, HCTZ metoprolol, aldactone UnStable On cleviprex gtt Will start back home meds- amlodipine and lisinopril SBP goal < 180 Long-term BP goal normotensive  Hyperlipidemia Home meds:  atorvastatin 80mg , resumed in hospital at half dose  LDL 58, goal < 70 Continue statin at discharge   Other Stroke Risk Factors  Obesity, Body mass index is 41.42 kg/m., BMI >/= 30 associated with increased stroke risk, recommend weight loss, diet and exercise as appropriate  Hx stroke/TIA  Other Active Problems Dysphagia   - Speech therapy following  AKI  - Cr 1.22. monitor BMP and urine output  Hypokalemia   - K  3.1-replete. Check in am  Anemia   - HGb 8.9. monitor   Hospital day # 3  .Patient presented with aphasia and right hemiplegia due to left M2 occlusion and and underwent partial revascularization with mechanical thrombectomy and was unable to get angioplasty stenting.  Follow-up CT scan shows a large left MCA cortical infarct with sparing of the basal ganglia and a smaller left cerebellar infarct as well.  Recommend   Mobilize out of bed.  Continue ongoing therapy consults.  .  Added Plavix to aspirin for 3 months given intracranial atherosclerosis likely etiology of her  stroke.  Long discussion with patient`s sister and daughter at the bedside and answered questions.   Discussed with rehab coordinator.  Greater than 50% time during this 25-minute visit was spent in counseling and coordination of care about her stroke rehab and answering questions.      , MD Medical Director [facility] Pager: [phone] 02/19/2021 1:30 PM  To contact Stroke Continuity provider, please refer to 13/12/2020. After hours, contact General Neurology

## 2021-02-20 DIAGNOSIS — I6602 Occlusion and stenosis of left middle cerebral artery: Secondary | ICD-10-CM | POA: Diagnosis not present

## 2021-02-20 LAB — BASIC METABOLIC PANEL
Anion gap: 8 (ref 5–15)
BUN: 13 mg/dL (ref 6–20)
CO2: 24 mmol/L (ref 22–32)
Calcium: 9.1 mg/dL (ref 8.9–10.3)
Chloride: 99 mmol/L (ref 98–111)
Creatinine, Ser: 1.13 mg/dL — ABNORMAL HIGH (ref 0.44–1.00)
GFR, Estimated: >60 mL/min (ref >60)
Glucose, Bld: 105 mg/dL — ABNORMAL HIGH (ref 70–99)
Potassium: 3.8 mmol/L (ref 3.5–5.1)
Sodium: 131 mmol/L — ABNORMAL LOW (ref 135–145)

## 2021-02-20 LAB — MAGNESIUM: Magnesium: 1.9 mg/dL (ref 1.7–2.4)

## 2021-02-20 MED ORDER — QUETIAPINE FUMARATE 25 MG PO TABS
25.0000 mg | ORAL_TABLET | Freq: Every day | ORAL | Status: DC
  Administered 2021-02-20: 25 mg via ORAL
  Filled 2021-02-20: qty 1

## 2021-02-20 NOTE — Plan of Care (Signed)
  Problem: Education: Goal: Knowledge of General Education information will improve Description: Including pain rating scale, medication(s)/side effects and non-pharmacologic comfort measures Outcome: Progressing   Problem: Health Behavior/Discharge Planning: Goal: Ability to manage health-related needs will improve Outcome: Progressing   Problem: Clinical Measurements: Goal: Ability to maintain clinical measurements within normal limits will improve Outcome: Progressing Goal: Will remain free from infection Outcome: Progressing Goal: Diagnostic test results will improve Outcome: Progressing Goal: Respiratory complications will improve Outcome: Progressing Goal: Cardiovascular complication will be avoided Outcome: Progressing   Problem: Activity: Goal: Risk for activity intolerance will decrease Outcome: Progressing   Problem: Nutrition: Goal: Adequate nutrition will be maintained Outcome: Progressing   Problem: Coping: Goal: Level of anxiety will decrease Outcome: Progressing   Problem: Elimination: Goal: Will not experience complications related to bowel motility Outcome: Progressing Goal: Will not experience complications related to urinary retention Outcome: Progressing   Problem: Pain Managment: Goal: General experience of comfort will improve Outcome: Progressing   Problem: Safety: Goal: Ability to remain free from injury will improve Outcome: Progressing   Problem: Skin Integrity: Goal: Risk for impaired skin integrity will decrease Outcome: Progressing   Problem: Education: Goal: Knowledge of secondary prevention will improve (SELECT ALL) Outcome: Progressing   Problem: Coping: Goal: Will verbalize positive feelings about self Outcome: Progressing Goal: Will identify appropriate support needs Outcome: Progressing   Problem: Health Behavior/Discharge Planning: Goal: Ability to manage health-related needs will improve Outcome: Progressing    Problem: Self-Care: Goal: Ability to participate in self-care as condition permits will improve Outcome: Progressing Goal: Verbalization of feelings and concerns over difficulty with self-care will improve Outcome: Progressing Goal: Ability to communicate needs accurately will improve Outcome: Progressing   Problem: Nutrition: Goal: Risk of aspiration will decrease Outcome: Progressing Goal: Dietary intake will improve Outcome: Progressing   Problem: Safety: Goal: Non-violent Restraint(s) Outcome: Completed/Met

## 2021-02-20 NOTE — Progress Notes (Signed)
STROKE TEAM PROGRESS NOTE   INTERVAL HISTORY Her daughter and sister are  at the bedside.  Patient is lying in the bed in NAD. She remains globally aphasic with right hemiparesis left gaze deviation and spontaneously moves right leg. She  interact sa bit and follows simple gestures and midline commands.  She makes a few guttural sounds but not able to speak clear words or sentences.  Therapy evaluation recommends inpatient rehab but no bed is available today.  She was agitated last night and has been started on Seroquel at bedtime.  She appears calm this morning. Vital signs are stable.  Neurological exam is unchanged.  Vitals:   02/20/21 0416 02/20/21 0421 02/20/21 0801 02/20/21 1124  BP:  (!) 188/96 (!) 157/83 (!) 143/80  Pulse:  69 67 67  Resp:   18 18  Temp: 99 F (37.2 C) 99 F (37.2 C) 97.6 F (36.4 C) 98.3 F (36.8 C)  TempSrc: Oral  Oral Oral  SpO2:   96% 97%  Height:       CBC:  Recent Labs  Lab 02/16/21 0015 02/16/21 0607 02/16/21 0712 02/18/21 0311  WBC 7.0 7.7  --  8.9  NEUTROABS 3.7 5.1  --   --   HGB 8.7* 8.9* 10.5* 9.2*  HCT 30.4* 30.3* 31.0* 32.4*  MCV 70.0* 69.0*  --  67.8*  PLT 339 399  --  373   Basic Metabolic Panel:  Recent Labs  Lab 02/18/21 0311 02/20/21 0414  NA 130* 131*  K 3.1* 3.8  CL 97* 99  CO2 22 24  GLUCOSE 126* 105*  BUN 7 13  CREATININE 0.90 1.13*  CALCIUM 8.9 9.1  MG  --  1.9   Lipid Panel:  Recent Labs  Lab 02/16/21 0607 02/17/21 0647  CHOL 155  --   TRIG 325* 216*  HDL 32*  --   CHOLHDL 4.8  --   VLDL 65*  --   LDLCALC 58  --    HgbA1c:  Recent Labs  Lab 02/16/21 0607  HGBA1C 5.7*   Urine Drug Screen: No results for input(s): LABOPIA, COCAINSCRNUR, LABBENZ, AMPHETMU, THCU, LABBARB in the last 168 hours.  Alcohol Level No results for input(s): ETH in the last 168 hours.  IMAGING past 24 hours No results found.  PHYSICAL EXAM  Temp:  [97.6 F (36.4 C)-99 F (37.2 C)] 98.3 F (36.8 C) (11/10 1124) Pulse  Rate:  [67-69] 67 (11/10 1124) Resp:  [18] 18 (11/10 1124) BP: (143-188)/(79-96) 143/80 (11/10 1124) SpO2:  [96 %-97 %] 97 % (11/10 1124)  General - obese middle-aged African-American lady, in no apparent distress.  Ophthalmologic - fundi not visualized due to noncooperation.  Cardiovascular - Regular rhythm and rate.  Mental Status -  She is drowsy but easily aroused. She is globally aphasic, does not follow commands  Cranial Nerves II - XII - II - unable to assess III, IV, VI - left gaze preference.  Will not follow to the right cross midline.  She blinks to threat on the left more than right V - Facial sensation intact bilaterally. VII - Facial movement intact bilaterally.  But diminished on the right VIII - Hearing & vestibular intact bilaterally. X -unable to assess XI - unable to assess XII - Tongue protrusion intact.  Motor Strength - Right arm is flaccid. LUE, LLE and RLE antigravity and moving spontaneously.  Bulk was normal and fasciculations were absent.   Motor Tone - Muscle tone was assessed at the  neck and appendages and was normal.  Sensory - Light touch, temperature/pinprick were assessed and were symmetrical.    Coordination - unable to assess  Gait and Station - deferred.   ASSESSMENT/PLAN [name] is a 45 y.o. female with history of multiple strokes, HTN, HLD, obesity who presented to [facility] ED for aphasia.  Stroke:  Acute large left MCA ischemic infarct s/p thrombectomy left M2 reocclusion, TICI 2B revascularization only..  Etiology likely underlying intracranial left MCA atherosclerosis Code Stroke CT head 1. No acute intracranial abnormality. 2. 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 3. ASPECTS is 10. CTA head & neck 1. Occlusion of the proximal M2 inferior division of the left middle cerebral artery. 2. Area of ischemia in the posterior left MCA territory corresponding to the area supplied by the  occluded vessel. 3. Other areas of perfusion deficit in the left cerebellum and right temporal lobe are less certain. CT perfusion CBF (<30%) Volume: 40mL. Perfusion (Tmax>6.0s) volume: 44mL. Mismatch Volume: 14mL Cerebral angio -S/P revascularization of occluded Inf division of Lt MCA with x1 pass with 40 mm solitairex retriever and contact aspiration achieving a TICI 3 reperfusion. 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 Reopened  with sec pass with combination as above.achieving a TICI 2C. Reoccluded ,with unsuccessful x 2 contact aspirations . Final TICI score 2b/2C Post IR CT  Lt basal ganglia blush and mild Lt ant parietal subarachnoid hyperdensity Repeat CT head 11/7: 1. Relatively large acute Left MCA infarct. Confluent cytotoxic edema. 2. Evidence also of acute Left Cerebellar PICA territory infarct. 3. But no malignant hemorrhagic transformation. Only trace rightward midline shift, and mild mass effect on the left 4th ventricle with no ventriculomegaly. 4. Underlying advanced chronic bilateral cerebral and cerebellar hemisphere ischemia. MRI pending 2D Echo EF 60-65%. No shunt LDL 58 HgbA1c 5.7 VTE prophylaxis - SCD's    Diet   DIET DYS 3 Room service appropriate? Yes; Fluid consistency: Thin   aspirin 81 mg daily prior to admission, now on aspirin 81 mg and plavix 75 mg daily.  Therapy recommendations: CLR  disposition:  pending  Hypertension Home meds:  norvasc, lisinopril, HCTZ metoprolol, aldactone UnStable On cleviprex gtt Will start back home meds- amlodipine and lisinopril SBP goal < 180 Long-term BP goal normotensive  Hyperlipidemia Home meds:  atorvastatin 80mg , resumed in hospital at half dose  LDL 58, goal < 70 Continue statin at discharge   Other Stroke Risk Factors  Obesity, Body mass index is 41.42 kg/m., BMI >/= 30 associated with increased stroke risk, recommend weight loss, diet and exercise as appropriate  Hx stroke/TIA  Other Active  Problems Dysphagia   - Speech therapy following  AKI  - Cr 1.22. monitor BMP and urine output  Hypokalemia   - K 3.1-replete. Check in am  Anemia   - HGb 8.9. monitor   Hospital day # 4  .Patient presented with aphasia and right hemiplegia due to left M2 occlusion and and underwent partial revascularization with mechanical thrombectomy and was unable to get angioplasty stenting.  Follow-up CT scan shows a large left MCA cortical infarct with sparing of the basal ganglia and a smaller left cerebellar infarct as well.  Recommend   Mobilize out of bed.  Continue ongoing therapy consults.  .  Continue Plavix to aspirin for 3 months given intracranial atherosclerosis likely etiology of her  stroke.  Continue Seroquel at bedtime for agitation.  Long discussion with patient`s sister and daughter at  the bedside and answered questions.   Discussed with patient and family and answered questions greater than 50% time during this 25-minute visit was spent in counseling and coordination of care about her stroke rehab and answering questions.      Delia Heady, MD Medical Director [facility] Pager: [phone] 02/20/2021 3:20 PM  To contact Stroke Continuity provider, please refer to [email or web address]. After hours, contact General Neurology

## 2021-02-20 NOTE — Progress Notes (Signed)
Occupational Therapy Treatment Patient Details Name: [name] MRN: [id number] DOB: 1975-08-13 Today's Date: 02/20/2021   History of present illness 45 yo female presents to [facility] on 11/5 with aphasia. CTA shows occlusion of proximal M2 inferior division of L MCA with area of ischemia in posterior L MCA, possible areas of ischemia L cerebellum and R temporal lobe. 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 PMH includes HTN, CVA.   OT comments  Pt making good progress with OT goals. This session she tolerated standing at the sink working on R sided attention and grooming for 10 mins. Pt with purposeful movements of her RUE, however she is unable to follow commands to move her RUE, most likely due to R sided neglect. Pt continues to demonstrate a L sided gaze and is unable to visually track towards or across midline. When resting in chair, pt was noted to have her head turned to the R side, however with activity pt remains turned to the L side. CIR continues to be pt's best option for maximizing her recovery and reducing caregiver burden once home. OT will continue to follow acutely.    Recommendations for follow up therapy are one component of a multi-disciplinary discharge planning process, led by the attending physician.  Recommendations may be updated based on patient status, additional functional criteria and insurance authorization.    Follow Up Recommendations  Acute inpatient rehab (3hours/day)    Assistance Recommended at Discharge Frequent or constant Supervision/Assistance  Equipment Recommendations  BSC/3in1    Recommendations for Other Services      Precautions / Restrictions Precautions Precautions: Fall Precaution Comments: R neglect, eyes not crossing midline to R, aphasia, SBP <180, apraxia Restrictions Weight Bearing Restrictions: No        Mobility Bed Mobility Overal bed mobility: Needs Assistance Bed Mobility: Supine to Sit     Supine to sit: Mod assist     General bed mobility comments: Up in chair    Transfers Overall transfer level: Needs assistance Equipment used: 1 person hand held assist Transfers: Sit to/from Stand Sit to Stand: Min assist           General transfer comment: Pt powering up with min A for steadying     Balance Overall balance assessment: Needs assistance Sitting-balance support: No upper extremity supported;Feet supported Sitting balance-Leahy Scale: Good     Standing balance support: Single extremity supported;During functional activity Standing balance-Leahy Scale: Fair Standing balance comment: static standing without UE support, for any dynamic activity needs UE support                           ADL either performed or assessed with clinical judgement   ADL Overall ADL's : Needs assistance/impaired     Grooming: Wash/dry hands;Wash/dry face;Maximal assistance;Standing Grooming Details (indicate cue type and reason): Min A for standing balance, Max A due to difficulty turning sink on and off, finding the running water, initially engaging the [employer] Transfer: Moderate assistance;Ambulation Toilet Transfer Details (indicate cue type and reason): Mod A due to difficulty find seat and mild lean towards L side when walking         Functional mobility during ADLs: Moderate assistance General ADL Comments: Pt standing well at  sink, weight bearing through RUE. Pt having difficulty seeing object and potentially remembering how to use some objects.    Extremity/Trunk Assessment              Vision   Vision Assessment?: Yes;Vision impaired- to be further tested in functional context Additional Comments: Pt continueing to not bring her gaze towards midline, difficulty tracking large item from L side towards midline, unable to  see sink and items at sink set up in front of her.   Perception     Praxis      Cognition Arousal/Alertness: Awake/alert Behavior During Therapy: Flat affect Overall Cognitive Status: Impaired/Different from baseline Area of Impairment: Following commands;Safety/judgement;Attention                   Current Attention Level: Focused   Following Commands: Follows one step commands with increased time Safety/Judgement: Decreased awareness of safety;Decreased awareness of deficits     General Comments: respongs to most 1 step commands, Difficulty at the sinkwith all tasks, unsure if it is cog related or vision related          Exercises     Shoulder Instructions       General Comments Pt with difficulty at sink, unsure if it is vision vs. cognitive    Pertinent Vitals/ Pain       Pain Assessment: No/denies pain Faces Pain Scale: No hurt  Home Living                                          Prior Functioning/Environment              Frequency  Min 2X/week        Progress Toward Goals  OT Goals(current goals can now be found in the care plan section)  Progress towards OT goals: Progressing toward goals  Acute Rehab OT Goals Patient Stated Goal: None stated OT Goal Formulation: Patient unable to participate in goal setting Time For Goal Achievement: 03/03/21 Potential to Achieve Goals: Good ADL Goals Pt Will Perform Grooming: with min assist;sitting Pt Will Transfer to Toilet: with mod assist;bedside commode Additional ADL Goal #1: pt will track to midline 50% of session of attempted task Additional ADL Goal #2: Pt will self feed 25% of meal  Plan Discharge plan remains appropriate    Co-evaluation                 AM-PAC OT "6 Clicks" Daily Activity     Outcome Measure   Help from another person eating meals?: A Lot Help from another person taking care of personal grooming?: A Lot Help from another person toileting,  which includes using toliet, bedpan, or urinal?: A Lot Help from another person bathing (including washing, rinsing, drying)?: A Lot Help from another person to put on and taking off regular upper body clothing?: A Lot Help from another person to put on and taking off regular lower body clothing?: A Lot 6 Click Score: 12    End of Session Equipment Utilized During Treatment: Gait belt  OT Visit Diagnosis: Unsteadiness on feet (R26.81);Muscle weakness (generalized) (M62.81)   Activity Tolerance Patient tolerated treatment well   Patient Left in chair;with call bell/phone within reach;with chair alarm set   Nurse Communication Mobility status        Time: 1450-1510 OT Time Calculation (min): 20 min  Charges: OT General  Charges $OT Visit: 1 Visit OT Treatments $Therapeutic Activity: 8-22 mins  Aradhana Gin H., OTR/L Acute Rehabilitation  Karla Pavone Elane Bing Plume 02/20/2021, 6:21 PM

## 2021-02-20 NOTE — Progress Notes (Signed)
Physical Therapy Treatment Patient Details Name: [name] MRN: [id number] DOB: Aug 12, 1975 Today's Date: 02/20/2021   History of Present Illness 45 yo female presents to [facility] on 11/5 with aphasia. CTA shows occlusion of proximal M2 inferior division of L MCA with area of ischemia in posterior L MCA, possible areas of ischemia L cerebellum and R temporal lobe. 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 PMH includes HTN, CVA.    PT Comments    Patient able to walk in hallway with chair following.  She is unable to locate anything to the R of midline including her room and the bathroom in her room.  She could not figure out how to use toilet paper in her L hand demonstrates apraxia with functional tasks.  She remains appropriate for inpatient rehab prior to d/c home.     Recommendations for follow up therapy are one component of a multi-disciplinary discharge planning process, led by the attending physician.  Recommendations may be updated based on patient status, additional functional criteria and insurance authorization.  Follow Up Recommendations  Acute inpatient rehab (3hours/day)     Assistance Recommended at Discharge Frequent or constant Supervision/Assistance  Equipment Recommendations  None recommended by PT    Recommendations for Other Services       Precautions / Restrictions Precautions Precautions: Fall Precaution Comments: R neglect, eyes not crossing midline to R, aphasia, SBP <180, apraxia     Mobility  Bed Mobility Overal bed mobility: Needs Assistance Bed Mobility: Supine to Sit     Supine to sit: Mod assist     General bed mobility comments: rather asleep initially, needed help for R LE and R UE    Transfers Overall transfer level: Needs assistance Equipment used: 1 person hand held assist Transfers: Sit to/from  Stand Sit to Stand: Min assist           General transfer comment: holding L hand initially, then held R hand to stand prior to walking; later PT entered room to assist pt as she was attempting to get up from recliner with legrest up    Ambulation/Gait Ambulation/Gait assistance: Min assist;+2 safety/equipment;Mod assist Gait Distance (Feet): 70 Feet (x 3) Assistive device: 1 person hand held assist Gait Pattern/deviations: Step-through pattern;Decreased stride length;Wide base of support       General Gait Details: initially holding L UE and pt stable, sat to rest, then assisted with R UE and pt reaching with L to hold wall, furniture etc as not aware I was on her R side despite cues and my arm encircling her waist. sat to rest again then walked back to room with approximation of R arm to her body, she could not find her room number on the R until I turned her around so she was facing the opposite direction.  Same thing when trying to help her find the bathroom in her room.   Stairs             Wheelchair Mobility    Modified Rankin (Stroke Patients Only) Modified Rankin (Stroke Patients Only) Pre-Morbid Rankin Score: No symptoms Modified Rankin: Moderately severe disability     Balance Overall balance assessment: Needs assistance   Sitting balance-Leahy Scale: Good       Standing balance-Leahy Scale: Fair Standing balance comment: static standing without UE support, for any dynamic activity needs UE support  Cognition Arousal/Alertness: Awake/alert Behavior During Therapy: Flat affect Overall Cognitive Status: Impaired/Different from baseline Area of Impairment: Following commands;Safety/judgement;Attention                   Current Attention Level: Focused   Following Commands: Follows one step commands with increased time Safety/Judgement: Decreased awareness of safety     General Comments: respongs to most  1 step commands, cannot figure out how to use toilet paper, could not find the bathroom without max physical assist though obviously needing to go        Exercises      General Comments General comments (skin integrity, edema, etc.): unable to coordinate to pull up her gown, doff her briefs or use toilet paper for toileting; max A provided      Pertinent Vitals/Pain Pain Assessment: Faces Faces Pain Scale: No hurt    Home Living                          Prior Function            PT Goals (current goals can now be found in the care plan section) Progress towards PT goals: Progressing toward goals    Frequency    Min 4X/week      PT Plan Current plan remains appropriate    Co-evaluation              AM-PAC PT "6 Clicks" Mobility   Outcome Measure  Help needed turning from your back to your side while in a flat bed without using bedrails?: A Little Help needed moving from lying on your back to sitting on the side of a flat bed without using bedrails?: A Little Help needed moving to and from a bed to a chair (including a wheelchair)?: A Lot Help needed standing up from a chair using your arms (e.g., wheelchair or bedside chair)?: A Lot Help needed to walk in hospital room?: A Lot Help needed climbing 3-5 steps with a railing? : Total 6 Click Score: 13    End of Session Equipment Utilized During Treatment: Gait belt Activity Tolerance: Patient tolerated treatment well Patient left: with call bell/phone within reach;in bed;with bed alarm set   PT Visit Diagnosis: Other abnormalities of gait and mobility (R26.89);Muscle weakness (generalized) (M62.81);Hemiplegia and hemiparesis;Apraxia (R48.2) Hemiplegia - Right/Left: Right Hemiplegia - dominant/non-dominant: Dominant Hemiplegia - caused by: Cerebral infarction     Time: 1425-1455 PT Time Calculation (min) (ACUTE ONLY): 30 min  Charges:  $Gait Training: 8-22 mins $Therapeutic Activity: 8-22  mins                     Sheran Lawless, PT Acute Rehabilitation Services Pager:[phone] Office:[phone] 02/20/2021    Elray Mcgregor 02/20/2021, 5:18 PM

## 2021-02-20 NOTE — Progress Notes (Signed)
Inpatient Rehab Admissions Coordinator:   Awaiting insurance determination for CIR prior auth request.  I will not have a bed for this patient to admit to CIR today.  Will continue to follow.   Estill Dooms, PT, DPT Admissions Coordinator [phone] 02/20/21  10:49 AM

## 2021-02-21 ENCOUNTER — Encounter (HOSPITAL_COMMUNITY): Payer: Self-pay | Admitting: Neurology

## 2021-02-21 ENCOUNTER — Inpatient Hospital Stay (HOSPITAL_COMMUNITY)
Admission: RE | Admit: 2021-02-21 | Discharge: 2021-03-05 | DRG: 057 | Disposition: A | Discharge status: Home Health | Payer: Medicaid Other | Source: Intra-hospital | Attending: Physical Medicine & Rehabilitation | Admitting: Physical Medicine & Rehabilitation

## 2021-02-21 ENCOUNTER — Other Ambulatory Visit: Payer: Self-pay

## 2021-02-21 ENCOUNTER — Encounter (HOSPITAL_COMMUNITY): Payer: Self-pay | Admitting: Physical Medicine & Rehabilitation

## 2021-02-21 DIAGNOSIS — I69352 Hemiplegia and hemiparesis following cerebral infarction affecting left dominant side: Secondary | ICD-10-CM | POA: Diagnosis not present

## 2021-02-21 DIAGNOSIS — I10 Essential (primary) hypertension: Secondary | ICD-10-CM | POA: Diagnosis present

## 2021-02-21 DIAGNOSIS — I69990 Apraxia following unspecified cerebrovascular disease: Secondary | ICD-10-CM

## 2021-02-21 DIAGNOSIS — I69390 Apraxia following cerebral infarction: Secondary | ICD-10-CM

## 2021-02-21 DIAGNOSIS — R4701 Aphasia: Secondary | ICD-10-CM

## 2021-02-21 DIAGNOSIS — G4700 Insomnia, unspecified: Secondary | ICD-10-CM | POA: Diagnosis present

## 2021-02-21 DIAGNOSIS — Z6841 Body Mass Index (BMI) 40.0 and over, adult: Secondary | ICD-10-CM | POA: Diagnosis not present

## 2021-02-21 DIAGNOSIS — I69312 Visuospatial deficit and spatial neglect following cerebral infarction: Secondary | ICD-10-CM

## 2021-02-21 DIAGNOSIS — I69320 Aphasia following cerebral infarction: Secondary | ICD-10-CM | POA: Diagnosis not present

## 2021-02-21 DIAGNOSIS — D509 Iron deficiency anemia, unspecified: Secondary | ICD-10-CM

## 2021-02-21 DIAGNOSIS — N179 Acute kidney failure, unspecified: Secondary | ICD-10-CM | POA: Diagnosis present

## 2021-02-21 DIAGNOSIS — I69351 Hemiplegia and hemiparesis following cerebral infarction affecting right dominant side: Secondary | ICD-10-CM | POA: Diagnosis present

## 2021-02-21 DIAGNOSIS — E785 Hyperlipidemia, unspecified: Secondary | ICD-10-CM | POA: Diagnosis present

## 2021-02-21 DIAGNOSIS — I6602 Occlusion and stenosis of left middle cerebral artery: Secondary | ICD-10-CM | POA: Diagnosis not present

## 2021-02-21 DIAGNOSIS — E782 Mixed hyperlipidemia: Secondary | ICD-10-CM | POA: Diagnosis present

## 2021-02-21 DIAGNOSIS — K5901 Slow transit constipation: Secondary | ICD-10-CM | POA: Diagnosis present

## 2021-02-21 DIAGNOSIS — I69920 Aphasia following unspecified cerebrovascular disease: Secondary | ICD-10-CM

## 2021-02-21 DIAGNOSIS — G479 Sleep disorder, unspecified: Secondary | ICD-10-CM

## 2021-02-21 DIAGNOSIS — Z7982 Long term (current) use of aspirin: Secondary | ICD-10-CM | POA: Diagnosis not present

## 2021-02-21 DIAGNOSIS — I63512 Cerebral infarction due to unspecified occlusion or stenosis of left middle cerebral artery: Secondary | ICD-10-CM | POA: Diagnosis not present

## 2021-02-21 DIAGNOSIS — Z79899 Other long term (current) drug therapy: Secondary | ICD-10-CM

## 2021-02-21 DIAGNOSIS — N189 Chronic kidney disease, unspecified: Secondary | ICD-10-CM

## 2021-02-21 DIAGNOSIS — I639 Cerebral infarction, unspecified: Secondary | ICD-10-CM

## 2021-02-21 MED ORDER — POLYETHYLENE GLYCOL 3350 17 G PO PACK
17.0000 g | PACK | Freq: Every day | ORAL | Status: DC | PRN
  Administered 2021-02-26 – 2021-02-27 (×2): 17 g via ORAL
  Filled 2021-02-21 (×3): qty 1

## 2021-02-21 MED ORDER — PROCHLORPERAZINE MALEATE 5 MG PO TABS: 5.0000 mg | ORAL_TABLET | Freq: Four times a day (QID) | ORAL | Status: DC | PRN

## 2021-02-21 MED ORDER — ASPIRIN EC 81 MG PO TBEC
81.0000 mg | DELAYED_RELEASE_TABLET | Freq: Every day | ORAL | Status: DC
  Administered 2021-02-22 – 2021-03-05 (×12): 81 mg via ORAL
  Filled 2021-02-21 (×13): qty 1

## 2021-02-21 MED ORDER — CLOPIDOGREL BISULFATE 75 MG PO TABS
75.0000 mg | ORAL_TABLET | Freq: Every day | ORAL | Status: DC
  Administered 2021-02-22 – 2021-03-05 (×12): 75 mg via ORAL
  Filled 2021-02-21 (×13): qty 1

## 2021-02-21 MED ORDER — EXERCISE FOR HEART AND HEALTH BOOK
Freq: Once | Status: AC
Start: 2021-02-21 — End: 2021-02-21
  Filled 2021-02-21: qty 1

## 2021-02-21 MED ORDER — DIPHENHYDRAMINE HCL 12.5 MG/5ML PO ELIX
12.5000 mg | ORAL_SOLUTION | Freq: Four times a day (QID) | ORAL | Status: DC | PRN
Start: 2021-02-21 — End: 2021-03-05

## 2021-02-21 MED ORDER — ASPIRIN 81 MG PO TBEC: 81.0000 mg | DELAYED_RELEASE_TABLET | Freq: Every day | ORAL | 11 refills | Status: DC

## 2021-02-21 MED ORDER — ATORVASTATIN CALCIUM 40 MG PO TABS
40.0000 mg | ORAL_TABLET | Freq: Every day | ORAL | Status: DC
  Administered 2021-02-22 – 2021-03-05 (×12): 40 mg via ORAL
  Filled 2021-02-21 (×14): qty 1

## 2021-02-21 MED ORDER — LISINOPRIL 20 MG PO TABS
20.0000 mg | ORAL_TABLET | Freq: Every day | ORAL | Status: DC
  Administered 2021-02-22 – 2021-03-05 (×12): 20 mg via ORAL
  Filled 2021-02-21 (×14): qty 1

## 2021-02-21 MED ORDER — ALUM & MAG HYDROXIDE-SIMETH 200-200-20 MG/5ML PO SUSP: 30.0000 mL | ORAL | Status: DC | PRN

## 2021-02-21 MED ORDER — LISINOPRIL 20 MG PO TABS: 20.0000 mg | ORAL_TABLET | Freq: Every day | ORAL | 1 refills | Status: DC

## 2021-02-21 MED ORDER — FERROUS GLUCONATE 324 (38 FE) MG PO TABS
324.0000 mg | ORAL_TABLET | Freq: Three times a day (TID) | ORAL | Status: DC
  Administered 2021-02-21 – 2021-03-05 (×36): 324 mg via ORAL
  Filled 2021-02-21 (×40): qty 1

## 2021-02-21 MED ORDER — TRAZODONE HCL 50 MG PO TABS
25.0000 mg | ORAL_TABLET | Freq: Every evening | ORAL | Status: DC | PRN
  Administered 2021-02-21: 50 mg via ORAL
  Filled 2021-02-21: qty 1

## 2021-02-21 MED ORDER — BLOOD PRESSURE CONTROL BOOK
Freq: Once | Status: AC
  Filled 2021-02-21: qty 1

## 2021-02-21 MED ORDER — ATORVASTATIN CALCIUM 40 MG PO TABS
40.0000 mg | ORAL_TABLET | Freq: Every day | ORAL | 1 refills | Status: DC
Start: 2021-02-22 — End: 2021-03-05

## 2021-02-21 MED ORDER — MELATONIN 3 MG PO TABS
3.0000 mg | ORAL_TABLET | Freq: Every day | ORAL | Status: DC
  Administered 2021-02-21 – 2021-03-04 (×12): 3 mg via ORAL
  Filled 2021-02-21 (×12): qty 1

## 2021-02-21 MED ORDER — GUAIFENESIN-DM 100-10 MG/5ML PO SYRP: 5.0000 mL | ORAL_SOLUTION | Freq: Four times a day (QID) | ORAL | Status: DC | PRN

## 2021-02-21 MED ORDER — PANTOPRAZOLE SODIUM 40 MG PO TBEC
40.0000 mg | DELAYED_RELEASE_TABLET | Freq: Every day | ORAL | Status: DC
  Administered 2021-02-21 – 2021-03-04 (×12): 40 mg via ORAL
  Filled 2021-02-21 (×12): qty 1

## 2021-02-21 MED ORDER — QUETIAPINE FUMARATE 25 MG PO TABS
25.0000 mg | ORAL_TABLET | Freq: Every day | ORAL | Status: DC
  Administered 2021-02-21 – 2021-03-04 (×12): 25 mg via ORAL
  Filled 2021-02-21 (×12): qty 1

## 2021-02-21 MED ORDER — AMLODIPINE BESYLATE 10 MG PO TABS
10.0000 mg | ORAL_TABLET | Freq: Every day | ORAL | Status: DC
  Administered 2021-02-22 – 2021-03-05 (×12): 10 mg via ORAL
  Filled 2021-02-21 (×13): qty 1

## 2021-02-21 MED ORDER — FLEET ENEMA 7-19 GM/118ML RE ENEM: 1.0000 | ENEMA | Freq: Once | RECTAL | Status: DC | PRN

## 2021-02-21 MED ORDER — METOPROLOL TARTRATE 50 MG PO TABS: 50.0000 mg | ORAL_TABLET | Freq: Two times a day (BID) | ORAL | 1 refills | Status: DC

## 2021-02-21 MED ORDER — ACETAMINOPHEN 325 MG PO TABS
650.0000 mg | ORAL_TABLET | ORAL | 1 refills | Status: DC | PRN
Start: 2021-02-21 — End: 2021-03-05

## 2021-02-21 MED ORDER — ORAL CARE MOUTH RINSE
15.0000 mL | Freq: Two times a day (BID) | OROMUCOSAL | Status: DC
  Administered 2021-02-21 – 2021-03-05 (×20): 15 mL via OROMUCOSAL

## 2021-02-21 MED ORDER — PANTOPRAZOLE SODIUM 40 MG PO TBEC: 40.0000 mg | DELAYED_RELEASE_TABLET | Freq: Every day | ORAL | 1 refills | Status: DC

## 2021-02-21 MED ORDER — CLOPIDOGREL BISULFATE 75 MG PO TABS: 75.0000 mg | ORAL_TABLET | Freq: Every day | ORAL | 0 refills | Status: DC

## 2021-02-21 MED ORDER — PROCHLORPERAZINE EDISYLATE 10 MG/2ML IJ SOLN: 5.0000 mg | Freq: Four times a day (QID) | INTRAMUSCULAR | Status: DC | PRN

## 2021-02-21 MED ORDER — FERROUS GLUCONATE 324 (38 FE) MG PO TABS: 324.0000 mg | ORAL_TABLET | Freq: Three times a day (TID) | ORAL | 3 refills | Status: DC

## 2021-02-21 MED ORDER — METOPROLOL TARTRATE 50 MG PO TABS
50.0000 mg | ORAL_TABLET | Freq: Two times a day (BID) | ORAL | Status: DC
  Administered 2021-02-21 – 2021-03-05 (×24): 50 mg via ORAL
  Filled 2021-02-21 (×24): qty 1

## 2021-02-21 MED ORDER — BISACODYL 10 MG RE SUPP
10.0000 mg | Freq: Every day | RECTAL | Status: DC | PRN
  Filled 2021-02-21: qty 1

## 2021-02-21 MED ORDER — PROCHLORPERAZINE 25 MG RE SUPP: 12.5000 mg | Freq: Four times a day (QID) | RECTAL | Status: DC | PRN

## 2021-02-21 MED ORDER — QUETIAPINE FUMARATE 25 MG PO TABS: 25.0000 mg | ORAL_TABLET | Freq: Every day | ORAL | 1 refills | Status: DC

## 2021-02-21 MED ORDER — ENOXAPARIN SODIUM 40 MG/0.4ML IJ SOSY
40.0000 mg | PREFILLED_SYRINGE | INTRAMUSCULAR | Status: DC
  Administered 2021-02-22 – 2021-03-05 (×12): 40 mg via SUBCUTANEOUS
  Filled 2021-02-21 (×13): qty 0.4

## 2021-02-21 MED ORDER — ENOXAPARIN SODIUM 40 MG/0.4ML IJ SOSY: 40.0000 mg | PREFILLED_SYRINGE | INTRAMUSCULAR | Status: DC

## 2021-02-21 MED ORDER — ACETAMINOPHEN 325 MG PO TABS
325.0000 mg | ORAL_TABLET | ORAL | Status: DC | PRN
  Administered 2021-02-22 – 2021-02-24 (×3): 650 mg via ORAL
  Filled 2021-02-21 (×3): qty 2

## 2021-02-21 MED ORDER — MELATONIN 3 MG PO TABS: 3.0000 mg | ORAL_TABLET | Freq: Every day | ORAL | 0 refills | Status: DC

## 2021-02-21 NOTE — H&P (Signed)
Physical Medicine and Rehabilitation Admission H&P    CC: Functional deficits due to stroke   HPI: [name] " [address]" Randall Mega is a 45 year old female with history of HTN, multiple prior CVA who was admitted via [facility] on 02/16/21 with right sided weakness and inability to speak. CTA/P  head was done showing perfusion mismatch w/ ischemia in left parietal region and M2 occlusion. She was transferred to [facility] and underwent cerebral angio with thrombectomy of Left M2 reoccusion and 2B revascularization only.  MRI brain done showing acute large MCA and left PICA territory infarct with mild mass-effect, additional multiple small acute infarcts in bilateral cerebral hemispheres suggestive of embolic event and advanced chronic white matter disease.  2D echo done showing EF of 60 to 65% with no wall abnormality and severely dilated LA and mild dilatation of ascending aorta -37 mm.  Dr. Pearlean Brownie felt the stroke was embolic secondary to intracranial left MCA atherosclerosis and DAPT x3 months recommended. She has had issues with hypokalemia requiring runs of K.  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  Therapy ongoing and patient continues to have patient scheduled right-sided weakness with aphasia and apraxia, left gaze deviation, visual deficits, sleep-wake disruption as well as question of dizziness with activity.  CIR recommended due to functional decline   Review of Systems  Unable to perform ROS: Patient nonverbal    Past Medical History:  Diagnosis Date   Anemia    Hypertension    Low back pain    Stroke [facility])    Thyroid goiter     Past Surgical History:  Procedure Laterality Date   CESAREAN SECTION     IR CT HEAD LTD  02/16/2021   IR CT HEAD LTD  02/16/2021   IR CT HEAD LTD  02/16/2021   IR CT HEAD LTD  02/16/2021   IR PERCUTANEOUS ART THROMBECTOMY/INFUSION INTRACRANIAL INC DIAG ANGIO  02/16/2021   RADIOLOGY  WITH ANESTHESIA N/A 02/16/2021   Procedure: IR WITH ANESTHESIA;  Surgeon: Julieanne Cotton, MD;  Location: [facility] OR;  Service: Radiology;  Laterality: N/A;   THYROIDECTOMY      Social History:  Lives alone. Worked as a Lawyer for [employer]. Per  reports that she has never smoked. She has been exposed to tobacco smoke. She has never used smokeless tobacco. Per reports current alcohol use. Per reports current drug use. Drug: Marijuana.   Allergies  Allergen Reactions   Labetalol Hcl     Medications Prior to Admission  Medication Sig Dispense Refill   acetaminophen (TYLENOL) 500 MG tablet Take 500-1,000 mg by mouth daily as needed.     amLODipine (NORVASC) 10 MG tablet Take 10 mg by mouth daily.     aspirin 81 MG EC tablet Take 81 mg by mouth daily.     atorvastatin (LIPITOR) 80 MG tablet Take 80 mg by mouth daily.     cetirizine (ZYRTEC ALLERGY) 10 MG tablet Take 1 tablet (10 mg total) by mouth daily. (Patient taking differently: Take 10 mg by mouth daily as needed.) 30 tablet 0   ergocalciferol (VITAMIN D2) 1.25 MG (50000 UT) capsule Take 50,000 Units by mouth once a week. On wednesdays     hydrochlorothiazide (HYDRODIURIL) 25 MG tablet Take 25 mg by mouth daily.     lisinopril (ZESTRIL) 40 MG tablet Take 40 mg by mouth daily.     metoprolol succinate (TOPROL-XL) 50 MG  24 hr tablet Take 50 mg by mouth 2 (two) times daily.     omeprazole (PRILOSEC) 40 MG capsule Take 40 mg by mouth daily.     spironolactone (ALDACTONE) 50 MG tablet Take 50 mg by mouth daily.      Drug Regimen Review  Drug regimen was reviewed and remains appropriate with no significant issues identified  Home: Home Living Family/patient expects to be discharged to:: Private residence Living Arrangements: Alone Available Help at Discharge: Available PRN/intermittently Type of Home: House Home Access: Stairs to enter [employer] of Steps: 2 Entrance Stairs-Rails: None Home Layout: One level Bathroom  Shower/Tub: Health visitor: Standard Home Equipment: None Additional Comments: has 3 children (19 yr 43 yo 29 yo) kids have been living with grandmother after passing of grandfather. 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) Mobility Comments: none ADLs Comments: working as Lawyer and driving per family  Functional Status:  Mobility: Bed Mobility Overal bed mobility: Needs Assistance Bed Mobility: Supine to Sit Supine to sit: Mod assist General bed mobility comments: Received in chair and in chair at end of session Transfers Overall transfer level: Needs assistance Equipment used: 1 person hand held assist Transfers: Sit to/from Stand, Bed to chair/wheelchair/BSC Sit to Stand: Min guard (No physical assist given) Bed to/from chair/wheelchair/BSC transfer type:: Step pivot Step pivot transfers: Min assist General transfer comment: pt is impulsive with standing, needs frequent cues to sit down and wait, but once prepared standing well with minA and no LOB with pivotal steps; then performed STS x 5 reps with single UE support from chair Ambulation/Gait Ambulation/Gait assistance: Min guard, +2 safety/equipment (Chair follow) Gait Distance (Feet): 40 Feet (40 ft, seated break 65 ft) Assistive device: None (Therapist with hand out for HHA if needed, pt did not utilize) Gait Pattern/deviations: Step-through pattern, Decreased stride length, Wide base of support General Gait Details: Pt stable with ambulation without HHA and up to minG physical assist. Pt was able to look towards R side when heavily cued, but not able to hold attention long or read any of the room numbers. Pt took a seated rest break and nodded "yes" when asked if dizzy, but later nodded "no" when asked if she was dizzy during ambulation.  Unable to read room numbers, even when directly facing wall Gait velocity: decreased Pre-gait activities: standing hip flexion x10 reps x2 sets with tapping/multimodal cues for sequencing    ADL: ADL Overall ADL's : Needs assistance/impaired Eating/Feeding: NPO Grooming: Wash/dry hands, Wash/dry face, Maximal assistance, Standing Grooming Details (indicate cue type and reason): Min A for standing balance, Max A due to difficulty turning sink on and off, finding the running water, initially engaging the RUE Upper Body Dressing : Moderate assistance Lower Body Dressing: Maximal assistance Lower Body Dressing Details (indicate cue type and reason): attempting to place sock on foot and demonstrates R LE figure 4 supine. pt sitting for L LE and attempting to bend over with L hand only use. Toilet Transfer: Moderate assistance, Ambulation Toilet Transfer Details (indicate cue type and reason): Mod A due to difficulty find seat and mild lean towards L side when walking Toileting- Clothing Manipulation and Hygiene: Total assistance Toileting - Clothing Manipulation Details (indicate cue type and reason): given toilet paper and does not initiate at all Functional mobility during ADLs: Moderate assistance  General ADL Comments: Pt standing well at sink, weight bearing through RUE. Pt having difficulty seeing object and potentially remembering how to use some objects.  Cognition: Cognition Overall Cognitive Status: Difficult to assess Arousal/Alertness: Awake/alert Orientation Level: Oriented to person Attention: Sustained Sustained Attention: Impaired Sustained Attention Impairment: Functional basic Memory:  (unable to assess) Awareness: Impaired Awareness Impairment: Intellectual impairment, Emergent impairment, Anticipatory impairment Problem Solving: Impaired Problem Solving Impairment: Functional basic, Verbal basic Safety/Judgment: Impaired Cognition Arousal/Alertness:  Awake/alert Behavior During Therapy: Flat affect Overall Cognitive Status: Difficult to assess Area of Impairment: Following commands, Safety/judgement, Attention Current Attention Level: Focused Following Commands: Follows one step commands with increased time, Follows one step commands inconsistently Safety/Judgement: Decreased awareness of safety, Decreased awareness of deficits General Comments: Pt impulsively standing up or sitting down when cued to lift feet up or to wait in chair, possibly some receptive aphasia in addition to expressive aphasia. pt has more difficulty following commands when therapist on her R side. At end of session pt looked at daughter on R side and smiled. Difficult to assess due to: Impaired communication   Blood pressure (!) 147/84, pulse 67, temperature 98.6 F (37 C), temperature source Oral, resp. rate 18, height 5\' 5"  (1.651 m), SpO2 100 %. Physical Exam Vitals reviewed.  Constitutional:      Appearance: Normal appearance. She is obese.  HENT:     Head: Normocephalic and atraumatic.     Right Ear: External ear normal.     Left Ear: External ear normal.     Nose: Nose normal.     Mouth/Throat:     Mouth: Mucous membranes are moist.     Pharynx: Oropharynx is clear.  Eyes:     Extraocular Movements: Extraocular movements intact.     Conjunctiva/sclera: Conjunctivae normal.  Cardiovascular:     Rate and Rhythm: Normal rate and regular rhythm.     Heart sounds: No murmur heard.   No gallop.  Pulmonary:     Effort: Pulmonary effort is normal. No respiratory distress.     Breath sounds: No wheezing.  Abdominal:     General: There is no distension.     Palpations: Abdomen is soft.     Tenderness: There is no abdominal tenderness.  Musculoskeletal:        General: No swelling. Normal range of motion.     Cervical back: Normal range of motion.  Skin:    General: Skin is warm and dry.  Neurological:     Mental Status: She is alert.     Comments:  Alert, makes eye contact. Expressive and receptive aphasia/apraxia.  Able to occasionally follow a simple command. Appears to have right HH and/or neglect. Right central 7 and tongue deviation. RUE ?2-3/5 with apraxia and inconsistent effort. RLE grossly 4/5. Decreased sense of pain and LT RUE and RLE. No abnl tone appreciated  Psychiatric:     Comments: Appears to be pleasant and up beat.     Results for orders placed or performed during the hospital encounter of 02/16/21 (from the past 48 hour(s))  Magnesium     Status: None   Collection Time: 02/20/21  4:14 AM  Result Value Ref Range   Magnesium 1.9 1.7 - 2.4 mg/dL    Comment: Performed at [facility], [address] [place], [place]  Basic metabolic panel     Status: Abnormal   Collection Time: 02/20/21  4:14 AM  Result Value Ref Range   Sodium 131 (L)  135 - 145 mmol/L   Potassium 3.8 3.5 - 5.1 mmol/L   Chloride 99 98 - 111 mmol/L   CO2 24 22 - 32 mmol/L   Glucose, Bld 105 (H) 70 - 99 mg/dL    Comment: Glucose reference range applies only to samples taken after fasting for at least 8 hours.   BUN 13 6 - 20 mg/dL   Creatinine, Ser 6.06 (H) 0.44 - 1.00 mg/dL   Calcium 9.1 8.9 - 77.0 mg/dL   GFR, Estimated >34 >03 mL/min    Comment: (NOTE) Calculated using the CKD-EPI Creatinine Equation (2021)    Anion gap 8 5 - 15    Comment: Performed at [facility], [address] [place], [place] [address]   No results found.     Medical Problem List and Plan: 1.  Functional deficits secondary to left MCA infarct with associated global aphasia/apraxia, right hemiparesis, and right visual-spatial deficits  -patient may shower  -ELOS/Goals: 16-18 days, supervision to min assist goals with PT, OT, SLP 2.  Antithrombotics: -DVT/anticoagulation:  Pharmaceutical: Lovenox  -antiplatelet therapy: DAPT x3 months 3. Pain Management: N/A 4. Mood: LCSW to follow for evaluation and support  -antipsychotic agents: N/A 5.  Neuropsych: This patient is not capable of making decisions on her own behalf. 6. Skin/Wound Care: Routine pressure-relief measures. 7. Fluids/Electrolytes/Nutrition: Monitor intake/output.  Check  CMet in AM. 8.  HTN: Monitor blood pressures TID.   -- Continue metoprolol, lisinopril 9:  Dyslipidemia: HDL- 32.  LDL 58-- > on Lipitor 80 mg 10.  Morbid obesity: BMI 41.  Educated on diet and exercise as well as weight loss to help promote overall health and mobility. 11.  Hypokalemia: Resolved with supplementation. 12.  Acute on chronic renal failure: BUN/serum 25/1.5 admission.    --Was 1.18  in 09/2020.  Monitor with serial checks. 13.  Iron deficiency anemia: Continue iron supplement.   14.  Insomnia: Continue melatonin as well as low-dose Seroquel at bedtime    Jacquelynn Cree, PA-C 02/21/2021

## 2021-02-21 NOTE — Progress Notes (Signed)
Inpatient Rehab Admissions Coordinator:    I have insurance approval and a bed available for pt to admit to CIR today. Dr. Pearlean Brownie in agreement.  Will let pt/family and TOC team know.   Estill Dooms, PT, DPT Admissions Coordinator [phone] 02/21/21  11:15 AM

## 2021-02-21 NOTE — Evaluation (Addendum)
Occupational Therapy Assessment and Plan  Patient Details  Name: [name] MRN: [id number] Date of Birth: 04/11/1976  OT Diagnosis: abnormal posture, apraxia, cognitive deficits, disturbance of vision, muscle weakness (generalized), and hemiplegia of unknown dominance Rehab Potential: Rehab Potential (ACUTE ONLY): Good ELOS: 10-12 days   Today's Date: 02/22/2021 OT Individual Time: 9244-6286 OT Individual Time Calculation (min): 57 min     Hospital Problem: Principal Problem:   Acute ischemic left middle cerebral artery (MCA) stroke (HCC)   Past Medical History:  Past Medical History:  Diagnosis Date   Anemia    history of goiter    Hypertension    Low back pain    Stroke [facility])    Past Surgical History:  Past Surgical History:  Procedure Laterality Date   CESAREAN SECTION     IR CT HEAD LTD  02/16/2021   IR CT HEAD LTD  02/16/2021   IR CT HEAD LTD  02/16/2021   IR CT HEAD LTD  02/16/2021   IR PERCUTANEOUS ART THROMBECTOMY/INFUSION INTRACRANIAL INC DIAG ANGIO  02/16/2021   RADIOLOGY WITH ANESTHESIA N/A 02/16/2021   Procedure: IR WITH ANESTHESIA;  Surgeon: Luanne Bras, MD;  Location: [place];  Service: Radiology;  Laterality: N/A;   THYROIDECTOMY      Assessment & Plan Clinical Impression: [name] is a 45 year old female with history of HTN, multiple prior CVA who was admitted via [facility] on 02/16/21 with right sided weakness and inability to speak. CTA/P  head was done showing perfusion mismatch w/ ischemia in left parietal region and M2 occlusion. She was transferred to [facility] and underwent cerebral angio with thrombectomy of Left M2 reoccusion and 2B revascularization only.  MRI brain done showing acute large MCA and left PICA territory infarct with mild mass-effect, additional multiple small acute infarcts in bilateral cerebral hemispheres suggestive of embolic event and advanced chronic white matter disease.  2D echo done showing EF of 60 to  65% with no wall abnormality and severely dilated LA and mild dilatation of ascending aorta -37 mm.  Dr. Leonie Man felt the stroke was embolic secondary to intracranial left MCA atherosclerosis and DAPT x3 months recommended. She has had issues with hypokalemia requiring runs of K.   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  Therapy ongoing and patient continues to have patient scheduled right-sided weakness with aphasia and apraxia, left gaze deviation, visual deficits, sleep-wake disruption as well as question of dizziness with activity.  CIR recommended due to functional decline  Patient currently requires Min-max with basic self-care skills secondary to muscle weakness, decreased cardiorespiratoy endurance, unbalanced muscle activation, motor apraxia, decreased coordination, and decreased motor planning, decreased visual motor skills, decreased attention to right and ideational apraxia, decreased awareness, decreased problem solving, decreased safety awareness, and delayed processing, and decreased standing balance, decreased postural control, hemiplegia, and decreased balance strategies.  Prior to hospitalization, patient could complete BADLs with independent .  Patient will benefit from skilled intervention to increase independence with basic self-care skills prior to discharge  home with family .  Anticipate patient will require 24 hour supervision and follow up home health.  OT - End of Session Endurance Deficit: Yes Endurance Deficit Description: Pt initiating returning to bed at end of session OT Assessment Rehab Potential (ACUTE ONLY): Good OT Barriers to Discharge: Decreased caregiver support;Behavior OT Barriers to Discharge Comments: Pt will need 24/7 support, ?family can provide  this, pt also impulsive with functional movement OT Patient demonstrates impairments in the following area(s):  Balance;Behavior;Cognition;Vision;Endurance;Motor;Safety OT Basic ADL's Functional Problem(s): Eating;Grooming;Bathing;Dressing;Toileting OT Advanced ADL's Functional Problem(s): Simple Meal Preparation OT Transfers Functional Problem(s): Toilet;Tub/Shower OT Additional Impairment(s): Fuctional Use of Upper Extremity OT Plan OT Intensity: Minimum of 1-2 x/day, 45 to 90 minutes OT Frequency: 5 out of 7 days OT Duration/Estimated Length of Stay: 10-12 days OT Treatment/Interventions: Balance/vestibular training;Disease mangement/prevention;Neuromuscular re-education;Self Care/advanced ADL retraining;Therapeutic Exercise;Wheelchair propulsion/positioning;UE/LE Strength taining/ROM;Pain management;DME/adaptive equipment instruction;Cognitive remediation/compensation;Community reintegration;Functional electrical stimulation;Patient/family education;UE/LE Coordination activities;Visual/perceptual remediation/compensation;Therapeutic Activities;Psychosocial support;Functional mobility training;Discharge planning OT Self Feeding Anticipated Outcome(s): Supervision OT Basic Self-Care Anticipated Outcome(s): Supervision OT Toileting Anticipated Outcome(s): Supervision OT Bathroom Transfers Anticipated Outcome(s): Supervision OT Recommendation Recommendations for Other Services: Other (comment) (none) Patient destination: Home Follow Up Recommendations: Home health OT Equipment Recommended: To be determined   OT Evaluation Precautions/Restrictions  Precautions Precautions: Fall Precaution Comments: Rt inattention, Rt eye blindness, apraxia, global aphasia Pain: no s/s pain during tx   Home Living/Prior [place] expects to be discharged to:: Private residence Living Arrangements: Children Available Help at Discharge: Available PRN/intermittently, Available 24 hours/day (inconsistent information from admission documentation) Type of Home: House Home Access: Stairs  to enter [employer] of Steps: 2 Home Layout: One level Bathroom Shower/Tub: Multimedia programmer: Standard Additional Comments: All information taken from admission notes due to pts global aphasia. Per notes, pt has 3 children ages 84, 68, and 5 y/o. Children have been living with grandmother. Pts oldest daughter can provide needed assistance at time of pts d/c  Lives With: Alone (per chart) IADL History Homemaking Responsibilities:  (unknown) Type of Occupation: Per chart, pt was a Quarry manager for [employer] and Hobbies: unknown Prior Function Level of Independence: Independent with basic ADLs (per chart) Vision Baseline Vision/History:  (unknown) Ability to See in Adequate Light:  moderately impaired (per clinical judgement) Patient Visual Report:  (pt unable to provide visual report due to aphasia) Vision Assessment?: Yes;Vision impaired- to be further tested in functional context Eye Alignment: Impaired (comment) Ocular Range of Motion: Restricted on the right Alignment/Gaze Preference: Gaze left Tracking/Visual Pursuits: Right eye does not track medially;Left eye does not track medially;Left eye does not track laterally;Right eye does not track laterally Additional Comments: Pt compensating with head turns to locate ADL items Rt of midline, very poor ability to scan and reach out for items presented at midline, needed items handed to her Perception  Perception: Impaired Inattention/Neglect: Does not attend to right visual field;Does not attend to right side of body Praxis Praxis: Impaired Praxis Impairment Details: Ideation;Motor planning;Ideomotor;Initiation Cognition Overall Cognitive Status: Difficult to assess Arousal/Alertness: Awake/alert Orientation Level: Nonverbal/unable to assess Year: Other (Comment) (unable to assess, pt nodding when provided with options of 2) Month:  (unable to assess, pt nodding when given options of 2) Day of Week: Other  (Comment) (unable to assess, pt nodding when given options of 2) Immediate Memory Recall:  (unable to assess due to global aphasia) Problem Solving: Impaired Problem Solving Impairment: Functional basic Safety/Judgment: Impaired Comments: due to apraxia, global aphasia, Rt inattention, and Rt proprioceptive deficits Sensation Sensation Light Touch: Not tested (unable to assess due to aphasia) Coordination Gross Motor Movements are Fluid and Coordinated: No Fine Motor Movements are Fluid and Coordinated: No Coordination and Movement Description: Rt hemiparesis Finger Nose Finger Test: Pt unable to follow instruction when given multimodal cues for test technique Motor  Motor Motor: Hemiplegia;Abnormal postural alignment and control;Motor apraxia  Trunk/Postural  Assessment  Cervical Assessment Cervical Assessment: Exceptions to [facility] (forward head) Thoracic Assessment Thoracic Assessment: Exceptions to [facility] (rounded shoulders) Lumbar Assessment Lumbar Assessment: Exceptions to [facility] (posterior pelvic tilt) Postural Control Postural Control: Deficits on evaluation (limited in standing)  Balance Balance Balance Assessed: Yes Dynamic Sitting Balance Dynamic Sitting - Balance Support: During functional activity Dynamic Sitting - Level of Assistance: 5: Stand by assistance (threading LEs into pants EOB) Dynamic Standing Balance Dynamic Standing - Balance Support: During functional activity Dynamic Standing - Level of Assistance: 4: Min assist Dynamic Standing - Balance Activities: Lateral lean/weight shifting;Forward lean/weight shifting (shower transfer) Extremity/Trunk Assessment RUE Assessment RUE Assessment: Within Functional Limits General Strength Comments: pt unable to follow instruction of ROM/strength testing due to aphasia LUE Assessment LUE Assessment: Within Functional Limits General Strength Comments: pt unable to follow instruction of ROM/strength testing due to  aphasia  Care Tool Care Tool Self Care Eating    Mod A per NT report    Oral Care    Oral Care Assist Level: Minimal Assistance - Patient > 75% (standing at the sink)    Bathing   Body parts bathed by patient: Left arm;Chest;Abdomen;Front perineal area;Buttocks;Right upper leg;Left upper leg;Face Body parts bathed by helper: Right arm;Right lower leg;Left lower leg   Assist Level: Minimal Assistance - Patient > 75%    Upper Body Dressing(including orthotics)   What is the patient wearing?: Pull over shirt   Assist Level: Maximal Assistance - Patient 25 - 49%    Lower Body Dressing (excluding footwear)   What is the patient wearing?: Pants Assist for lower body dressing: Moderate Assistance - Patient 50 - 74%    Putting on/Taking off footwear   What is the patient wearing?: Non-skid slipper socks Assist for footwear: Maximal Assistance - Patient 25 - 49%       Care Tool Toileting Toileting activity Toileting Activity did not occur (Clothing management and hygiene only): N/A (no void or bm)         Toilet transfer   Assist Level: Contact Guard/Touching assist      Refer to Care Plan for Long Term Goals  SHORT TERM GOAL WEEK 1 OT Short Term Goal 1 (Week 1): Pt will complete LB dressing with no more than Min A OT Short Term Goal 2 (Week 1): Pt will complete UB dressing with no more than Mod A OT Short Term Goal 3 (Week 1): Pt will complete oral care while standing at the sink with supervision balance assistance  Recommendations for other services: None    Skilled Therapeutic Intervention Skilled OT session completed with focus on initial evaluation, education on OT role/POC, and establishment of patient-centered goals.   Pt greeted in bed with no s/s pain. She engaged in bathing (at shower level, sit<stand), dressing (EOB at sit<stand level without AD), and oral care/grooming tasks (standing at the sink) during session. All functional transfers completed with CGA  without AD. Note that pt has profound Rt inattention, Rt UE proprioception deficits, apraxia, and global aphasia. Able to understand verbal instruction ~50% of the time, did better with presentation of ADL item on her Lt side. Restricted ability to visually track, pt unable to track Rt of midline and compensated with head turns when given cues to look for something. Even during ambulation, pt would turn in circles and proceed in leftmost direction to go to bathroom, sink, or EOB. She wasn't able to follow instruction during formal assessments of eval when given verbal, visual, and demonstrational cues.  Pt responded with "Um" or "yes" or indistinct sounds throughout session. During orientation questioning, she nodded to all questions. Note that with presentation of clothing items, pt placed her Lt hand inside of the shirt pocket and stopped and also stood up before threading her Rt UE into pants, pausing afterwards for ~30 seconds then responding quickly to tactile cues to sit down. Pt returned to bed at close of session, all needs within reach and bed alarm set.   ADL ADL Eating: Moderate assistance (per NT) Where Assessed-Eating: Bed level Grooming: Minimal assistance Where Assessed-Grooming: Standing at sink Upper Body Bathing: Minimal assistance Where Assessed-Upper Body Bathing: Shower Lower Body Bathing: Minimal assistance Where Assessed-Lower Body Bathing: Shower Upper Body Dressing: Maximal assistance Where Assessed-Upper Body Dressing: Edge of bed Lower Body Dressing: Moderate assistance Where Assessed-Lower Body Dressing: Edge of bed Toileting: Not assessed Toilet Transfer: Not assessed Social research officer, government: Curator Method: Ambulating (without AD) Youth worker: Transfer tub bench   Discharge Criteria: Patient will be discharged from OT if patient refuses treatment 3 consecutive times without medical reason, if treatment goals not met, if  there is a change in medical status, if patient makes no progress towards goals or if patient is discharged from hospital.  The above assessment, treatment plan, treatment alternatives and goals were discussed and mutually agreed upon: by patient  Skeet Simmer 02/22/2021, 1:03 PM

## 2021-02-21 NOTE — Discharge Summary (Addendum)
Stroke Discharge Summary  Patient ID: [name]   MRN: [id number]      DOB: [birth year]  Date of Admission: 02/16/2021 Date of Discharge: 02/21/2021  Attending Physician:  Stroke, Md, MD, Stroke MD Consultant(s):    pulmonary/intensive care Mariea Clonts, MD  Patient's PCP:  Fanny Dance, FNP  DISCHARGE DIAGNOSIS:   Acute large left MCA ischemic infarct s/p thrombectomy left M2 reocclusion, TICI 2B revascularization only..  Etiology likely underlying intracranial left MCA atherosclerosis Active Problems:   Malignant hypertension   Stroke (cerebrum) (HCC)   Middle cerebral artery embolism, left   Endotracheally intubated Global aphasia Right hemiplegia     LABORATORY STUDIES CBC    Component Value Date/Time   WBC 8.9 02/18/2021 0311   RBC 4.78 02/18/2021 0311   HGB 9.2 (L) 02/18/2021 0311   HCT 32.4 (L) 02/18/2021 0311   PLT 373 02/18/2021 0311   MCV 67.8 (L) 02/18/2021 0311   MCH 19.2 (L) 02/18/2021 0311   MCHC 28.4 (L) 02/18/2021 0311   RDW 19.2 (H) 02/18/2021 0311   LYMPHSABS 2.2 02/16/2021 0607   MONOABS 0.4 02/16/2021 0607   EOSABS 0.1 02/16/2021 0607   BASOSABS 0.0 02/16/2021 0607   CMP    Component Value Date/Time   NA 131 (L) 02/20/2021 0414   K 3.8 02/20/2021 0414   CL 99 02/20/2021 0414   CO2 24 02/20/2021 0414   GLUCOSE 105 (H) 02/20/2021 0414   BUN 13 02/20/2021 0414   CREATININE 1.13 (H) 02/20/2021 0414   CALCIUM 9.1 02/20/2021 0414   PROT 8.1 02/16/2021 0015   ALBUMIN 3.6 02/16/2021 0015   AST 15 02/16/2021 0015   ALT 9 02/16/2021 0015   ALKPHOS 56 02/16/2021 0015   BILITOT 0.4 02/16/2021 0015   GFRNONAA >60 02/20/2021 0414   COAGS Lab Results  Component Value Date   INR 1.0 02/16/2021   INR 0.9 09/29/2020   Lipid Panel    Component Value Date/Time   CHOL 155 02/16/2021 0607   TRIG 216 (H) 02/17/2021 0647   HDL 32 (L) 02/16/2021 0607   CHOLHDL 4.8 02/16/2021 0607   VLDL 65 (H) 02/16/2021 0607   LDLCALC 58  02/16/2021 0607   HgbA1C  Lab Results  Component Value Date   HGBA1C 5.7 (H) 02/16/2021   Urinalysis No results found for: COLORURINE, APPEARANCEUR, LABSPEC, PHURINE, GLUCOSEU, HGBUR, BILIRUBINUR, KETONESUR, PROTEINUR, UROBILINOGEN, NITRITE, LEUKOCYTESUR Urine Drug Screen No results found for: LABOPIA, COCAINSCRNUR, LABBENZ, AMPHETMU, THCU, LABBARB  Alcohol Level No results found for: [facility]   SIGNIFICANT DIAGNOSTIC STUDIES CT HEAD WO CONTRAST ( )  Addendum Date: 02/17/2021   ADDENDUM REPORT: 02/17/2021 10:49 ADDENDUM: Study discussed by telephone with NP DENISE WOLFE on 02/17/2021 at 1040 hours. Electronically Signed   By: Odessa Fleming M.D.   On: 02/17/2021 10:49   Result Date: 02/17/2021 CLINICAL DATA:  45 year old female code stroke presentation with left MCA M2 ELVO. Status post endovascular reperfusion yesterday. EXAM: CT HEAD WITHOUT CONTRAST TECHNIQUE: Contiguous axial images were obtained from the base of the skull through the vertex without intravenous contrast. COMPARISON:  Presentation head CT 02/15/2021 at 2354 hours. FINDINGS: Brain: Confluent cytotoxic edema now throughout the majority of the left MCA territory. The middle MCA division is relatively spared as seen on series 3, image 24. underlying chronic encephalomalacia in the left anterior MCA territory, left inferior frontal gyrus, left caudate. Mild intracranial mass effect with only trace rightward midline shift on series 3, image 21. Mildly effaced  left lateral ventricle. No ventriculomegaly. Contralateral right hemisphere chronic encephalomalacia primarily in the posterior right MCA territory or right MCA/PCA watershed is stable. Chronic right cerebellar PICA territory infarct. However, new/increasing hypodensity in the left cerebellum PICA territory (series 3, image 11). And mild mass effect on the left 4th ventricle. Basilar cisterns remain patent. No intracranial hemorrhagic transformation identified. Vascular: No suspicious  intracranial vascular hyperdensity. Skull: Motion artifact at the skull base. No acute osseous abnormality identified. Sinuses/Orbits: Mild bilateral ethmoid and sphenoid sinus fluid and mucosal thickening now. Tympanic cavities and mastoids remain clear. Other: No acute orbit or scalp soft tissue finding. IMPRESSION: 1. Relatively large acute Left MCA infarct. Confluent cytotoxic edema. 2. Evidence also of acute Left Cerebellar PICA territory infarct. 3. But no malignant hemorrhagic transformation. Only trace rightward midline shift, and mild mass effect on the left 4th ventricle with no ventriculomegaly. 4. Underlying advanced chronic bilateral cerebral and cerebellar hemisphere ischemia. Electronically Signed: By: Odessa Fleming M.D. On: 02/17/2021 10:35   MR BRAIN WO CONTRAST  Result Date: 02/18/2021 CLINICAL DATA:  Stroke, follow-up. EXAM: MRI HEAD WITHOUT CONTRAST TECHNIQUE: Multiplanar, multiecho pulse sequences of the brain and surrounding structures were obtained without intravenous contrast. COMPARISON:  Head CT February 17, 2021. FINDINGS: Brain: Large areas of restricted diffusion involving the left frontotemporal parietal and insular region, lateral aspect of the left occipital lobe and left cerebellar hemisphere. Multiple small foci of restricted diffusion are also seen scattered through the bilateral cerebral hemispheres. There is mild mass effect on the fourth ventricle without hydrocephalus. A 3 mm rightward midline shift is also seen supratentorially. Area of encephalomalacia and gliosis are seen in the left frontal lobe, right temporoparietal region and right cerebellar hemisphere. Remote lacunar infarcts are also seen the bilateral basal ganglia. Scattered and confluent foci of T2 hyperintensity are seen within the white matter of the cerebral hemispheres, nonspecific. Vascular: Susceptibility artifact noted along the left MCA M2 and M3 branches, may represent intraluminal thrombus. Skull and upper  cervical spine: Diffuse decrease of the T1 signal within the visualized spine, clivus and calvarium, may represent red marrow reconversion. Sinuses/Orbits: Mucosal thickening of the ethmoid cells and sphenoid sinuses with fluid level within the right sphenoid sinus. Other: None. IMPRESSION: 1. Acute large left MCA and left PICA territory infarcts with mild mass effect. No evidence of hemorrhagic transformation. 2. Additional multiple small acute infarcts in the bilateral cerebral hemispheres, suggestive of embolic events. 3. Advanced chronic white matter disease. Given recent angiogram findings, this may be related to severe intracranial atherosclerotic disease versus vasculitis. Electronically Signed   By: Baldemar Lenis M.D.   On: 02/18/2021 17:00   IR CT Head Ltd  Result Date: 02/19/2021 INDICATION: New onset of global aphasia, and right-sided hemiparesis. Occluded inferior division of the left middle cerebral artery on CT angiogram the head and neck. EXAM: 1. EMERGENT LARGE VESSEL OCCLUSION THROMBOLYSIS (anterior CIRCULATION) COMPARISON:  CT angiogram of the head and neck of February 16, 2021. MEDICATIONS: Ancef 2 g IV antibiotic was administered within 1 hour of the procedure. ANESTHESIA/SEDATION: General anesthesia. CONTRAST:  Omnipaque 350 approximately 180 mL. FLUOROSCOPY TIME:  Fluoroscopy Time: 75 minutes 54 seconds (3829 mGy). COMPLICATIONS: None immediate. TECHNIQUE: Following a full explanation of the procedure along with the potential associated complications, an informed witnessed consent was obtained. The risks of intracranial hemorrhage of 10%, worsening neurological deficit, ventilator dependency, death and inability to revascularize were all reviewed in detail with the patient's mother. The patient was then put  under general anesthesia by the Department of Anesthesiology at [facility]. The right groin was prepped and draped in the usual sterile fashion. Thereafter using  modified Seldinger technique, transfemoral access into the right common femoral artery was obtained without difficulty. Over a 0.035 inch guidewire an 8 [place] Pinnacle 257 sheath was inserted. Through this, and also over a 0.035 inch guidewire a 5.5 French Simmons 2 catheter inside of an 087 97 balloon guide catheter combination was advanced to the aortic arch region. Access was obtained into the left common carotid artery over an 035 inch glidewire and support catheter, followed by the advancement of an 087 balloon guide catheter into the mid 1/3 of the left ICA. The guidewire and the support catheter were removed. Good aspiration obtained from the hub of the 087 balloon guide catheter. A control arteriogram performed through this demonstrated patency at the bulb to the cranial skull base. The petrous, the cavernous and the supraclinoid segments demonstrate wide patency. The left anterior cerebral artery opacifies into the capillary and venous phases. Seen are multiple focal areas of moderate to moderately severe stenosis of the pericallosal and callosal marginal branches most consistent with intracranial arteriosclerosis. The left middle cerebral artery demonstrates patency of the left M1 segment. Patency is seen of the superior division and the mid branch of the trifurcation which demonstrates 2 branches supplying the more superior anterior portions of the frontal parietal regions. There is complete occlusion of the far distal inferior division in its mid M2 region. A prominent area of hypoperfusion is noted in the left posterior parietal and angular regions. The superior division demonstrates also focal areas of caliber irregularity as does the more superior branch of the inferior division in the M2 M3 region most consistent with intracranial arteriosclerosis. FINDINGS: As above. PROCEDURE: Through the combination of an 055 132 cm Zoom aspiration catheter inside of which was a 162 cm 021 microcatheter advanced  over a 0.014 inch standard Synchro micro guidewire with a moderate J configuration to the supraclinoid left ICA. Using a torque device, access was obtained into the occluded inferior division with the micro guidewire which was advanced to the distal M2 M3 region followed by the microcatheter. The guidewire was removed. Good aspiration obtained from the hub of the microcatheter. A gentle control arteriogram performed through the microcatheter demonstrated safe position of the tip of the microcatheter which was then connected to continuous heparinized saline infusion. A 3 mm x 40 mm Solitaire X retrieval device was then advanced to the distal end of microcatheter. The O ring on the delivery microcatheter was loosened. With slight forward gentle traction with the right hand on the delivery micro guidewire with the left hand the delivery microcatheter was retrieved deploying the distal and the proximal portion of the retrieval device. The proximal portion of the retrieval device was just proximal to the occluded segment of the inferior division. The Zoom aspiration catheter was then advanced to the origin of the occluded inferior division. With proximal flow arrest in the left internal carotid artery, and aspiration through the 55 Zoom aspiration catheter with a Penumbra aspiration device for approximately 3 minutes, the combination of the retrieval device, the microcatheter, and Zoom aspiration catheter was retrieved and removed. Control arteriogram performed following reversal of flow arrest in the left internal carotid artery demonstrated now complete revascularization of the occluded inferior division. A TICI 3 revascularization was achieved. This also revealed focal areas of moderate to severe smooth stenosis of the proximal portion of the  previously occluded inferior division. Heterogeneous clot was noted in the retrieval device. Patient was treated with 3 aliquots of nitroglycerin anteriorly with modest relief of  the previously noted vasospasm. However, there persisted severe stenosis at the site of the previously noted occlusion probably related to severe intracranial arteriosclerosis given the patient had global focal areas suggestive of intracranial arteriosclerosis in the anterior and the middle cerebral artery circulation as mentioned above. A control arteriogram performed 5 minutes post revascularization demonstrated complete reocclusion of the previously revascularized branch. This prompted a second pass with a 3 mm x 40 mm retrieval device as mentioned above. After having safely catheterized the inferior division M2 M3 region, a 3 mm x 40 mm Solitaire retrieval device was deployed for approximately 3 minutes. Thereafter with proximal flow arrest in the left internal carotid artery and aspiration with the Zoom aspiration catheter just inside the origin of the inferior division with a Penumbra aspiration device, and with a 20 mL syringe at the hub of the balloon aspiration catheter, the combination of the retrieval device, the microcatheter and the Zoom aspiration catheter were retrieved and removed. Following reversal of flow arrest, a control arteriogram performed through the balloon guide in the left internal carotid artery now demonstrated revascularization of the occluded left middle cerebral inferior division. A TICI 2C revascularization was now noted. Moderate spasm in the revascularized inferior division was treated with 2 aliquots of 25 mcg of nitroglycerin intra-arterially. Control arteriogram performed 5 minutes later, demonstrated reocclusion of the inferior division. Measurements were made of the revascularized inferior division just proximal and distal to the site of the occlusion from the previous arteriograms. A rescue stent was concerted at this time. Following further discussions with the neuro hospitalist, it was decided to initially see a response to a 3 mm 40 mm Solitaire X retrieval device in  terms of radial force. A 3 x 40 mm Solitaire device was again deployed again as mentioned above at the site of the re-stenosis. This demonstrated significant continued narrowing in the retrieval suggestive of a firm atherosclerotic plaque. This vessel was deemed too small for a balloon mountable stent or a balloon angioplasty, with increased risk of rupture. A third pass was then considered with contact aspiration using a 055 Zoom aspiration catheter which was advanced into the occluded proximal segment for 3 minutes followed by retrieval with aspiration with a Penumbra aspiration device. A control arteriogram performed following retrieval and reversal of flow arrest now demonstrated filling defects at the distal end of the left M1 segment with a nonocclusive clot noted in the proximal superior division. A fourth pass was then advanced using a 055 Zoom aspiration catheter, inside of which was again an 021 162 cm Trevo Trak microcatheter advanced over a 0.014 inch standard Synchro micro guidewire at this time through the prominent superior branch of the inferior division. Contact aspiration was now performed upon removal of the microcatheter using a Penumbra aspiration device for approximately 3 minutes. Following this the Zoom aspiration catheter was retrieved and removed. A control arteriogram performed following flow arrest reversal in the left internal carotid artery now demonstrated patency of the superior division, the branches of the inferior division as noted previously. The occluded inferior division proximally as before. Overall, the patient's cerebrovascular circulation in the left MCA distribution was returned to the pre treatment level. No further attempts were made for revascularization of the inferior division given the reocclusion due to the severely stenotic proximal aspect of this branch due to intracranial arteriosclerosis.  The balloon guide catheter was retrieved and removed. An 8 French  Angio-Seal closure device was then deployed for hemostasis following removal of the 25 cm 8 [place] sheath. Distal pulses remained Dopplerable in both feet unchanged. A CT of the brain performed immediately at the end of the procedure continued to demonstrate no gross evidence of intracranial hemorrhage. There was a mild amount of hyperattenuation noted along the posterior insular region with early contrast stain in the left putamen. No gross mass effect or midline shift was noted. Areas of hypoattenuation noted prior to the procedure remained stable. The ventricles remain stable as well. Patient was then transferred intubated as the COVID test results were not available. IMPRESSION: Status post endovascular revascularization of occluded prominent branches of the inferior division of the left middle cerebral artery with 2 passes with a 3 mm x 40 mm Solitaire X retrieval device achieving a TICI 3, and a TICI 2C revascularizations with reocclusion. Unsuccessful attempt at revascularization of the occluded inferior division branch with 2 passes with contact aspiration subsequent to reocclusion. PLAN: As per referring MD. Electronically Signed   By: Julieanne Cotton M.D.   On: 02/19/2021 08:15   IR CT Head Ltd  Result Date: 02/19/2021 INDICATION: New onset of global aphasia, and right-sided hemiparesis. Occluded inferior division of the left middle cerebral artery on CT angiogram the head and neck. EXAM: 1. EMERGENT LARGE VESSEL OCCLUSION THROMBOLYSIS (anterior CIRCULATION) COMPARISON:  CT angiogram of the head and neck of February 16, 2021. MEDICATIONS: Ancef 2 g IV antibiotic was administered within 1 hour of the procedure. ANESTHESIA/SEDATION: General anesthesia. CONTRAST:  Omnipaque 350 approximately 180 mL. FLUOROSCOPY TIME:  Fluoroscopy Time: 75 minutes 54 seconds (3829 mGy). COMPLICATIONS: None immediate. TECHNIQUE: Following a full explanation of the procedure along with the potential associated  complications, an informed witnessed consent was obtained. The risks of intracranial hemorrhage of 10%, worsening neurological deficit, ventilator dependency, death and inability to revascularize were all reviewed in detail with the patient's mother. The patient was then put under general anesthesia by the Department of Anesthesiology at [facility]. The right groin was prepped and draped in the usual sterile fashion. Thereafter using modified Seldinger technique, transfemoral access into the right common femoral artery was obtained without difficulty. Over a 0.035 inch guidewire an 8 [place] Pinnacle 257 sheath was inserted. Through this, and also over a 0.035 inch guidewire a 5.5 French Simmons 2 catheter inside of an 087 97 balloon guide catheter combination was advanced to the aortic arch region. Access was obtained into the left common carotid artery over an 035 inch glidewire and support catheter, followed by the advancement of an 087 balloon guide catheter into the mid 1/3 of the left ICA. The guidewire and the support catheter were removed. Good aspiration obtained from the hub of the 087 balloon guide catheter. A control arteriogram performed through this demonstrated patency at the bulb to the cranial skull base. The petrous, the cavernous and the supraclinoid segments demonstrate wide patency. The left anterior cerebral artery opacifies into the capillary and venous phases. Seen are multiple focal areas of moderate to moderately severe stenosis of the pericallosal and callosal marginal branches most consistent with intracranial arteriosclerosis. The left middle cerebral artery demonstrates patency of the left M1 segment. Patency is seen of the superior division and the mid branch of the trifurcation which demonstrates 2 branches supplying the more superior anterior portions of the frontal parietal regions. There is complete occlusion of the far distal inferior division  in its mid M2 region. A  prominent area of hypoperfusion is noted in the left posterior parietal and angular regions. The superior division demonstrates also focal areas of caliber irregularity as does the more superior branch of the inferior division in the M2 M3 region most consistent with intracranial arteriosclerosis. FINDINGS: As above. PROCEDURE: Through the combination of an 055 132 cm Zoom aspiration catheter inside of which was a 162 cm 021 microcatheter advanced over a 0.014 inch standard Synchro micro guidewire with a moderate J configuration to the supraclinoid left ICA. Using a torque device, access was obtained into the occluded inferior division with the micro guidewire which was advanced to the distal M2 M3 region followed by the microcatheter. The guidewire was removed. Good aspiration obtained from the hub of the microcatheter. A gentle control arteriogram performed through the microcatheter demonstrated safe position of the tip of the microcatheter which was then connected to continuous heparinized saline infusion. A 3 mm x 40 mm Solitaire X retrieval device was then advanced to the distal end of microcatheter. The O ring on the delivery microcatheter was loosened. With slight forward gentle traction with the right hand on the delivery micro guidewire with the left hand the delivery microcatheter was retrieved deploying the distal and the proximal portion of the retrieval device. The proximal portion of the retrieval device was just proximal to the occluded segment of the inferior division. The Zoom aspiration catheter was then advanced to the origin of the occluded inferior division. With proximal flow arrest in the left internal carotid artery, and aspiration through the 55 Zoom aspiration catheter with a Penumbra aspiration device for approximately 3 minutes, the combination of the retrieval device, the microcatheter, and Zoom aspiration catheter was retrieved and removed. Control arteriogram performed following  reversal of flow arrest in the left internal carotid artery demonstrated now complete revascularization of the occluded inferior division. A TICI 3 revascularization was achieved. This also revealed focal areas of moderate to severe smooth stenosis of the proximal portion of the previously occluded inferior division. Heterogeneous clot was noted in the retrieval device. Patient was treated with 3 aliquots of nitroglycerin anteriorly with modest relief of the previously noted vasospasm. However, there persisted severe stenosis at the site of the previously noted occlusion probably related to severe intracranial arteriosclerosis given the patient had global focal areas suggestive of intracranial arteriosclerosis in the anterior and the middle cerebral artery circulation as mentioned above. A control arteriogram performed 5 minutes post revascularization demonstrated complete reocclusion of the previously revascularized branch. This prompted a second pass with a 3 mm x 40 mm retrieval device as mentioned above. After having safely catheterized the inferior division M2 M3 region, a 3 mm x 40 mm Solitaire retrieval device was deployed for approximately 3 minutes. Thereafter with proximal flow arrest in the left internal carotid artery and aspiration with the Zoom aspiration catheter just inside the origin of the inferior division with a Penumbra aspiration device, and with a 20 mL syringe at the hub of the balloon aspiration catheter, the combination of the retrieval device, the microcatheter and the Zoom aspiration catheter were retrieved and removed. Following reversal of flow arrest, a control arteriogram performed through the balloon guide in the left internal carotid artery now demonstrated revascularization of the occluded left middle cerebral inferior division. A TICI 2C revascularization was now noted. Moderate spasm in the revascularized inferior division was treated with 2 aliquots of 25 mcg of nitroglycerin  intra-arterially. Control arteriogram performed 5 minutes later,  demonstrated reocclusion of the inferior division. Measurements were made of the revascularized inferior division just proximal and distal to the site of the occlusion from the previous arteriograms. A rescue stent was concerted at this time. Following further discussions with the neuro hospitalist, it was decided to initially see a response to a 3 mm 40 mm Solitaire X retrieval device in terms of radial force. A 3 x 40 mm Solitaire device was again deployed again as mentioned above at the site of the re-stenosis. This demonstrated significant continued narrowing in the retrieval suggestive of a firm atherosclerotic plaque. This vessel was deemed too small for a balloon mountable stent or a balloon angioplasty, with increased risk of rupture. A third pass was then considered with contact aspiration using a 055 Zoom aspiration catheter which was advanced into the occluded proximal segment for 3 minutes followed by retrieval with aspiration with a Penumbra aspiration device. A control arteriogram performed following retrieval and reversal of flow arrest now demonstrated filling defects at the distal end of the left M1 segment with a nonocclusive clot noted in the proximal superior division. A fourth pass was then advanced using a 055 Zoom aspiration catheter, inside of which was again an 021 162 cm Trevo Trak microcatheter advanced over a 0.014 inch standard Synchro micro guidewire at this time through the prominent superior branch of the inferior division. Contact aspiration was now performed upon removal of the microcatheter using a Penumbra aspiration device for approximately 3 minutes. Following this the Zoom aspiration catheter was retrieved and removed. A control arteriogram performed following flow arrest reversal in the left internal carotid artery now demonstrated patency of the superior division, the branches of the inferior division as noted  previously. The occluded inferior division proximally as before. Overall, the patient's cerebrovascular circulation in the left MCA distribution was returned to the pre treatment level. No further attempts were made for revascularization of the inferior division given the reocclusion due to the severely stenotic proximal aspect of this branch due to intracranial arteriosclerosis. The balloon guide catheter was retrieved and removed. An 8 French Angio-Seal closure device was then deployed for hemostasis following removal of the 25 cm 8 [place] sheath. Distal pulses remained Dopplerable in both feet unchanged. A CT of the brain performed immediately at the end of the procedure continued to demonstrate no gross evidence of intracranial hemorrhage. There was a mild amount of hyperattenuation noted along the posterior insular region with early contrast stain in the left putamen. No gross mass effect or midline shift was noted. Areas of hypoattenuation noted prior to the procedure remained stable. The ventricles remain stable as well. Patient was then transferred intubated as the COVID test results were not available. IMPRESSION: Status post endovascular revascularization of occluded prominent branches of the inferior division of the left middle cerebral artery with 2 passes with a 3 mm x 40 mm Solitaire X retrieval device achieving a TICI 3, and a TICI 2C revascularizations with reocclusion. Unsuccessful attempt at revascularization of the occluded inferior division branch with 2 passes with contact aspiration subsequent to reocclusion. PLAN: As per referring MD. Electronically Signed   By: Julieanne Cotton M.D.   On: 02/19/2021 08:15   IR CT Head Ltd  Result Date: 02/19/2021 INDICATION: New onset of global aphasia, and right-sided hemiparesis. Occluded inferior division of the left middle cerebral artery on CT angiogram the head and neck. EXAM: 1. EMERGENT LARGE VESSEL OCCLUSION THROMBOLYSIS (anterior CIRCULATION)  COMPARISON:  CT angiogram of the head and neck  of February 16, 2021. MEDICATIONS: Ancef 2 g IV antibiotic was administered within 1 hour of the procedure. ANESTHESIA/SEDATION: General anesthesia. CONTRAST:  Omnipaque 350 approximately 180 mL. FLUOROSCOPY TIME:  Fluoroscopy Time: 75 minutes 54 seconds (3829 mGy). COMPLICATIONS: None immediate. TECHNIQUE: Following a full explanation of the procedure along with the potential associated complications, an informed witnessed consent was obtained. The risks of intracranial hemorrhage of 10%, worsening neurological deficit, ventilator dependency, death and inability to revascularize were all reviewed in detail with the patient's mother. The patient was then put under general anesthesia by the Department of Anesthesiology at [facility]. The right groin was prepped and draped in the usual sterile fashion. Thereafter using modified Seldinger technique, transfemoral access into the right common femoral artery was obtained without difficulty. Over a 0.035 inch guidewire an 8 [place] Pinnacle 257 sheath was inserted. Through this, and also over a 0.035 inch guidewire a 5.5 French Simmons 2 catheter inside of an 087 97 balloon guide catheter combination was advanced to the aortic arch region. Access was obtained into the left common carotid artery over an 035 inch glidewire and support catheter, followed by the advancement of an 087 balloon guide catheter into the mid 1/3 of the left ICA. The guidewire and the support catheter were removed. Good aspiration obtained from the hub of the 087 balloon guide catheter. A control arteriogram performed through this demonstrated patency at the bulb to the cranial skull base. The petrous, the cavernous and the supraclinoid segments demonstrate wide patency. The left anterior cerebral artery opacifies into the capillary and venous phases. Seen are multiple focal areas of moderate to moderately severe stenosis of the pericallosal and  callosal marginal branches most consistent with intracranial arteriosclerosis. The left middle cerebral artery demonstrates patency of the left M1 segment. Patency is seen of the superior division and the mid branch of the trifurcation which demonstrates 2 branches supplying the more superior anterior portions of the frontal parietal regions. There is complete occlusion of the far distal inferior division in its mid M2 region. A prominent area of hypoperfusion is noted in the left posterior parietal and angular regions. The superior division demonstrates also focal areas of caliber irregularity as does the more superior branch of the inferior division in the M2 M3 region most consistent with intracranial arteriosclerosis. FINDINGS: As above. PROCEDURE: Through the combination of an 055 132 cm Zoom aspiration catheter inside of which was a 162 cm 021 microcatheter advanced over a 0.014 inch standard Synchro micro guidewire with a moderate J configuration to the supraclinoid left ICA. Using a torque device, access was obtained into the occluded inferior division with the micro guidewire which was advanced to the distal M2 M3 region followed by the microcatheter. The guidewire was removed. Good aspiration obtained from the hub of the microcatheter. A gentle control arteriogram performed through the microcatheter demonstrated safe position of the tip of the microcatheter which was then connected to continuous heparinized saline infusion. A 3 mm x 40 mm Solitaire X retrieval device was then advanced to the distal end of microcatheter. The O ring on the delivery microcatheter was loosened. With slight forward gentle traction with the right hand on the delivery micro guidewire with the left hand the delivery microcatheter was retrieved deploying the distal and the proximal portion of the retrieval device. The proximal portion of the retrieval device was just proximal to the occluded segment of the inferior division. The  Zoom aspiration catheter was then advanced to the origin of  the occluded inferior division. With proximal flow arrest in the left internal carotid artery, and aspiration through the 55 Zoom aspiration catheter with a Penumbra aspiration device for approximately 3 minutes, the combination of the retrieval device, the microcatheter, and Zoom aspiration catheter was retrieved and removed. Control arteriogram performed following reversal of flow arrest in the left internal carotid artery demonstrated now complete revascularization of the occluded inferior division. A TICI 3 revascularization was achieved. This also revealed focal areas of moderate to severe smooth stenosis of the proximal portion of the previously occluded inferior division. Heterogeneous clot was noted in the retrieval device. Patient was treated with 3 aliquots of nitroglycerin anteriorly with modest relief of the previously noted vasospasm. However, there persisted severe stenosis at the site of the previously noted occlusion probably related to severe intracranial arteriosclerosis given the patient had global focal areas suggestive of intracranial arteriosclerosis in the anterior and the middle cerebral artery circulation as mentioned above. A control arteriogram performed 5 minutes post revascularization demonstrated complete reocclusion of the previously revascularized branch. This prompted a second pass with a 3 mm x 40 mm retrieval device as mentioned above. After having safely catheterized the inferior division M2 M3 region, a 3 mm x 40 mm Solitaire retrieval device was deployed for approximately 3 minutes. Thereafter with proximal flow arrest in the left internal carotid artery and aspiration with the Zoom aspiration catheter just inside the origin of the inferior division with a Penumbra aspiration device, and with a 20 mL syringe at the hub of the balloon aspiration catheter, the combination of the retrieval device, the microcatheter and the  Zoom aspiration catheter were retrieved and removed. Following reversal of flow arrest, a control arteriogram performed through the balloon guide in the left internal carotid artery now demonstrated revascularization of the occluded left middle cerebral inferior division. A TICI 2C revascularization was now noted. Moderate spasm in the revascularized inferior division was treated with 2 aliquots of 25 mcg of nitroglycerin intra-arterially. Control arteriogram performed 5 minutes later, demonstrated reocclusion of the inferior division. Measurements were made of the revascularized inferior division just proximal and distal to the site of the occlusion from the previous arteriograms. A rescue stent was concerted at this time. Following further discussions with the neuro hospitalist, it was decided to initially see a response to a 3 mm 40 mm Solitaire X retrieval device in terms of radial force. A 3 x 40 mm Solitaire device was again deployed again as mentioned above at the site of the re-stenosis. This demonstrated significant continued narrowing in the retrieval suggestive of a firm atherosclerotic plaque. This vessel was deemed too small for a balloon mountable stent or a balloon angioplasty, with increased risk of rupture. A third pass was then considered with contact aspiration using a 055 Zoom aspiration catheter which was advanced into the occluded proximal segment for 3 minutes followed by retrieval with aspiration with a Penumbra aspiration device. A control arteriogram performed following retrieval and reversal of flow arrest now demonstrated filling defects at the distal end of the left M1 segment with a nonocclusive clot noted in the proximal superior division. A fourth pass was then advanced using a 055 Zoom aspiration catheter, inside of which was again an 021 162 cm Trevo Trak microcatheter advanced over a 0.014 inch standard Synchro micro guidewire at this time through the prominent superior branch of  the inferior division. Contact aspiration was now performed upon removal of the microcatheter using a Penumbra aspiration device for approximately 3  minutes. Following this the Zoom aspiration catheter was retrieved and removed. A control arteriogram performed following flow arrest reversal in the left internal carotid artery now demonstrated patency of the superior division, the branches of the inferior division as noted previously. The occluded inferior division proximally as before. Overall, the patient's cerebrovascular circulation in the left MCA distribution was returned to the pre treatment level. No further attempts were made for revascularization of the inferior division given the reocclusion due to the severely stenotic proximal aspect of this branch due to intracranial arteriosclerosis. The balloon guide catheter was retrieved and removed. An 8 French Angio-Seal closure device was then deployed for hemostasis following removal of the 25 cm 8 [place] sheath. Distal pulses remained Dopplerable in both feet unchanged. A CT of the brain performed immediately at the end of the procedure continued to demonstrate no gross evidence of intracranial hemorrhage. There was a mild amount of hyperattenuation noted along the posterior insular region with early contrast stain in the left putamen. No gross mass effect or midline shift was noted. Areas of hypoattenuation noted prior to the procedure remained stable. The ventricles remain stable as well. Patient was then transferred intubated as the COVID test results were not available. IMPRESSION: Status post endovascular revascularization of occluded prominent branches of the inferior division of the left middle cerebral artery with 2 passes with a 3 mm x 40 mm Solitaire X retrieval device achieving a TICI 3, and a TICI 2C revascularizations with reocclusion. Unsuccessful attempt at revascularization of the occluded inferior division branch with 2 passes with contact  aspiration subsequent to reocclusion. PLAN: As per referring MD. Electronically Signed   By: Julieanne Cotton M.D.   On: 02/19/2021 08:15   IR CT Head Ltd  Result Date: 02/19/2021 INDICATION: New onset of global aphasia, and right-sided hemiparesis. Occluded inferior division of the left middle cerebral artery on CT angiogram the head and neck. EXAM: 1. EMERGENT LARGE VESSEL OCCLUSION THROMBOLYSIS (anterior CIRCULATION) COMPARISON:  CT angiogram of the head and neck of February 16, 2021. MEDICATIONS: Ancef 2 g IV antibiotic was administered within 1 hour of the procedure. ANESTHESIA/SEDATION: General anesthesia. CONTRAST:  Omnipaque 350 approximately 180 mL. FLUOROSCOPY TIME:  Fluoroscopy Time: 75 minutes 54 seconds (3829 mGy). COMPLICATIONS: None immediate. TECHNIQUE: Following a full explanation of the procedure along with the potential associated complications, an informed witnessed consent was obtained. The risks of intracranial hemorrhage of 10%, worsening neurological deficit, ventilator dependency, death and inability to revascularize were all reviewed in detail with the patient's mother. The patient was then put under general anesthesia by the Department of Anesthesiology at [facility]. The right groin was prepped and draped in the usual sterile fashion. Thereafter using modified Seldinger technique, transfemoral access into the right common femoral artery was obtained without difficulty. Over a 0.035 inch guidewire an 8 [place] Pinnacle 257 sheath was inserted. Through this, and also over a 0.035 inch guidewire a 5.5 French Simmons 2 catheter inside of an 087 97 balloon guide catheter combination was advanced to the aortic arch region. Access was obtained into the left common carotid artery over an 035 inch glidewire and support catheter, followed by the advancement of an 087 balloon guide catheter into the mid 1/3 of the left ICA. The guidewire and the support catheter were removed. Good  aspiration obtained from the hub of the 087 balloon guide catheter. A control arteriogram performed through this demonstrated patency at the bulb to the cranial skull base. The petrous, the cavernous  and the supraclinoid segments demonstrate wide patency. The left anterior cerebral artery opacifies into the capillary and venous phases. Seen are multiple focal areas of moderate to moderately severe stenosis of the pericallosal and callosal marginal branches most consistent with intracranial arteriosclerosis. The left middle cerebral artery demonstrates patency of the left M1 segment. Patency is seen of the superior division and the mid branch of the trifurcation which demonstrates 2 branches supplying the more superior anterior portions of the frontal parietal regions. There is complete occlusion of the far distal inferior division in its mid M2 region. A prominent area of hypoperfusion is noted in the left posterior parietal and angular regions. The superior division demonstrates also focal areas of caliber irregularity as does the more superior branch of the inferior division in the M2 M3 region most consistent with intracranial arteriosclerosis. FINDINGS: As above. PROCEDURE: Through the combination of an 055 132 cm Zoom aspiration catheter inside of which was a 162 cm 021 microcatheter advanced over a 0.014 inch standard Synchro micro guidewire with a moderate J configuration to the supraclinoid left ICA. Using a torque device, access was obtained into the occluded inferior division with the micro guidewire which was advanced to the distal M2 M3 region followed by the microcatheter. The guidewire was removed. Good aspiration obtained from the hub of the microcatheter. A gentle control arteriogram performed through the microcatheter demonstrated safe position of the tip of the microcatheter which was then connected to continuous heparinized saline infusion. A 3 mm x 40 mm Solitaire X retrieval device was then  advanced to the distal end of microcatheter. The O ring on the delivery microcatheter was loosened. With slight forward gentle traction with the right hand on the delivery micro guidewire with the left hand the delivery microcatheter was retrieved deploying the distal and the proximal portion of the retrieval device. The proximal portion of the retrieval device was just proximal to the occluded segment of the inferior division. The Zoom aspiration catheter was then advanced to the origin of the occluded inferior division. With proximal flow arrest in the left internal carotid artery, and aspiration through the 55 Zoom aspiration catheter with a Penumbra aspiration device for approximately 3 minutes, the combination of the retrieval device, the microcatheter, and Zoom aspiration catheter was retrieved and removed. Control arteriogram performed following reversal of flow arrest in the left internal carotid artery demonstrated now complete revascularization of the occluded inferior division. A TICI 3 revascularization was achieved. This also revealed focal areas of moderate to severe smooth stenosis of the proximal portion of the previously occluded inferior division. Heterogeneous clot was noted in the retrieval device. Patient was treated with 3 aliquots of nitroglycerin anteriorly with modest relief of the previously noted vasospasm. However, there persisted severe stenosis at the site of the previously noted occlusion probably related to severe intracranial arteriosclerosis given the patient had global focal areas suggestive of intracranial arteriosclerosis in the anterior and the middle cerebral artery circulation as mentioned above. A control arteriogram performed 5 minutes post revascularization demonstrated complete reocclusion of the previously revascularized branch. This prompted a second pass with a 3 mm x 40 mm retrieval device as mentioned above. After having safely catheterized the inferior division M2 M3  region, a 3 mm x 40 mm Solitaire retrieval device was deployed for approximately 3 minutes. Thereafter with proximal flow arrest in the left internal carotid artery and aspiration with the Zoom aspiration catheter just inside the origin of the inferior division with a Penumbra aspiration device,  and with a 20 mL syringe at the hub of the balloon aspiration catheter, the combination of the retrieval device, the microcatheter and the Zoom aspiration catheter were retrieved and removed. Following reversal of flow arrest, a control arteriogram performed through the balloon guide in the left internal carotid artery now demonstrated revascularization of the occluded left middle cerebral inferior division. A TICI 2C revascularization was now noted. Moderate spasm in the revascularized inferior division was treated with 2 aliquots of 25 mcg of nitroglycerin intra-arterially. Control arteriogram performed 5 minutes later, demonstrated reocclusion of the inferior division. Measurements were made of the revascularized inferior division just proximal and distal to the site of the occlusion from the previous arteriograms. A rescue stent was concerted at this time. Following further discussions with the neuro hospitalist, it was decided to initially see a response to a 3 mm 40 mm Solitaire X retrieval device in terms of radial force. A 3 x 40 mm Solitaire device was again deployed again as mentioned above at the site of the re-stenosis. This demonstrated significant continued narrowing in the retrieval suggestive of a firm atherosclerotic plaque. This vessel was deemed too small for a balloon mountable stent or a balloon angioplasty, with increased risk of rupture. A third pass was then considered with contact aspiration using a 055 Zoom aspiration catheter which was advanced into the occluded proximal segment for 3 minutes followed by retrieval with aspiration with a Penumbra aspiration device. A control arteriogram performed  following retrieval and reversal of flow arrest now demonstrated filling defects at the distal end of the left M1 segment with a nonocclusive clot noted in the proximal superior division. A fourth pass was then advanced using a 055 Zoom aspiration catheter, inside of which was again an 021 162 cm Trevo Trak microcatheter advanced over a 0.014 inch standard Synchro micro guidewire at this time through the prominent superior branch of the inferior division. Contact aspiration was now performed upon removal of the microcatheter using a Penumbra aspiration device for approximately 3 minutes. Following this the Zoom aspiration catheter was retrieved and removed. A control arteriogram performed following flow arrest reversal in the left internal carotid artery now demonstrated patency of the superior division, the branches of the inferior division as noted previously. The occluded inferior division proximally as before. Overall, the patient's cerebrovascular circulation in the left MCA distribution was returned to the pre treatment level. No further attempts were made for revascularization of the inferior division given the reocclusion due to the severely stenotic proximal aspect of this branch due to intracranial arteriosclerosis. The balloon guide catheter was retrieved and removed. An 8 French Angio-Seal closure device was then deployed for hemostasis following removal of the 25 cm 8 [place] sheath. Distal pulses remained Dopplerable in both feet unchanged. A CT of the brain performed immediately at the end of the procedure continued to demonstrate no gross evidence of intracranial hemorrhage. There was a mild amount of hyperattenuation noted along the posterior insular region with early contrast stain in the left putamen. No gross mass effect or midline shift was noted. Areas of hypoattenuation noted prior to the procedure remained stable. The ventricles remain stable as well. Patient was then transferred intubated as  the COVID test results were not available. IMPRESSION: Status post endovascular revascularization of occluded prominent branches of the inferior division of the left middle cerebral artery with 2 passes with a 3 mm x 40 mm Solitaire X retrieval device achieving a TICI 3, and a TICI 2C revascularizations with  reocclusion. Unsuccessful attempt at revascularization of the occluded inferior division branch with 2 passes with contact aspiration subsequent to reocclusion. PLAN: As per referring MD. Electronically Signed   By: Julieanne Cotton M.D.   On: 02/19/2021 08:15   DG CHEST PORT 1 VIEW  Result Date: 02/16/2021 CLINICAL DATA:  Intubated EXAM: PORTABLE CHEST 1 VIEW COMPARISON:  08/01/2020 chest radiograph. FINDINGS: Endotracheal tube tip is 3.7 cm above the carina. Enteric tube terminates in mediastinum just below the level the level of the carina to the left of midline. Stable cardiomediastinal silhouette with mild cardiomegaly. No pneumothorax. No pleural effusion. Diffuse prominence of the perihilar interstitial markings. IMPRESSION: 1. Enteric tube terminates in the mediastinum just below the level of the carina to the left of midline, recommend advancing 15 cm. 2. Well-positioned endotracheal tube. 3. Stable mild cardiomegaly with diffuse prominence of the perihilar interstitial markings, favor pulmonary edema and/or atelectasis. These results will be called to the ordering clinician or representative by the Radiologist Assistant, and communication documented in the PACS or [employer]. Electronically Signed   By: Delbert Phenix M.D.   On: 02/16/2021 06:28   ECHOCARDIOGRAM COMPLETE  Result Date: 02/16/2021    ECHOCARDIOGRAM REPORT   Patient Name:   [name] Date of Exam: 02/16/2021 Medical Rec #:  [id number]              Height:       65.0 in Accession #:    [id number]             Weight:       248.9 lb Date of Birth:  25-May-1975              BSA:          2.171 m Patient Age:    45  years               BP:           160/64 mmHg Patient Gender: F                      HR:           84 bpm. Exam Location:  Inpatient Procedure: 2D Echo, Color Doppler and Cardiac Doppler Indications:    Stroke  History:        Patient has no prior history of Echocardiogram examinations.                 Signs/Symptoms:Shortness of Breath; Risk Factors:Morbid obesity                 and Hypertension.  Sonographer:    Roosvelt Maser RDCS Referring Phys: [phone] MCNEILL P KIRKPATRICK IMPRESSIONS  1. There is mild SAM of the anterior MV leaflet in the setting of severe symmetric LVH. Difficult to assess dynamic gradient from Dopplers, appears to be mild. . Left ventricular ejection fraction, by estimation, is 60 to 65%. The left ventricle has normal function. The left ventricle has no regional wall motion abnormalities. There is severe left ventricular hypertrophy. Left ventricular diastolic parameters were normal.  2. Right ventricular systolic function is normal. The right ventricular size is normal. Tricuspid regurgitation signal is inadequate for assessing PA pressure.  3. Left atrial size was severely dilated.  4. The mitral valve is normal in structure. Trivial mitral valve regurgitation. No evidence of mitral stenosis.  5. The aortic valve is tricuspid. Aortic valve regurgitation is not visualized. No aortic stenosis is present.  6. Aortic dilatation noted. There is mild dilatation of the ascending aorta, measuring 37 mm.  7. The inferior vena cava is normal in size with greater than 50% respiratory variability, suggesting right atrial pressure of 3 mmHg. FINDINGS  Left Ventricle: There is mild SAM of the anterior MV leaflet in the setting of severe symmetric LVH. Difficult to assess dynamic gradient from Dopplers, appears to be mild. Left ventricular ejection fraction, by estimation, is 60 to 65%. The left ventricle has normal function. The left ventricle has no regional wall motion abnormalities. The left ventricular  internal cavity size was normal in size. There is severe left ventricular hypertrophy. Left ventricular diastolic parameters were normal. Right Ventricle: The right ventricular size is normal. No increase in right ventricular wall thickness. Right ventricular systolic function is normal. Tricuspid regurgitation signal is inadequate for assessing PA pressure. Left Atrium: Left atrial size was severely dilated. Right Atrium: Right atrial size was normal in size. Pericardium: There is no evidence of pericardial effusion. Mitral Valve: The mitral valve is normal in structure. Trivial mitral valve regurgitation. No evidence of mitral valve stenosis. Tricuspid Valve: The tricuspid valve is normal in structure. Tricuspid valve regurgitation is trivial. No evidence of tricuspid stenosis. Aortic Valve: The aortic valve is tricuspid. Aortic valve regurgitation is not visualized. No aortic stenosis is present. Aortic valve mean gradient measures 12.0 mmHg. Aortic valve peak gradient measures 23.2 mmHg. Pulmonic Valve: The pulmonic valve was not well visualized. Pulmonic valve regurgitation is not visualized. No evidence of pulmonic stenosis. Aorta: The aortic root is normal in size and structure and aortic dilatation noted. There is mild dilatation of the ascending aorta, measuring 37 mm. Venous: The inferior vena cava is normal in size with greater than 50% respiratory variability, suggesting right atrial pressure of 3 mmHg. IAS/Shunts: No atrial level shunt detected by color flow Doppler.  LEFT VENTRICLE PLAX 2D LVIDd:         5.00 cm   Diastology LVIDs:         2.90 cm   LV e' medial:    8.49 cm/s LV PW:         1.70 cm   LV E/e' medial:  18.1 LV IVS:        1.70 cm   LV e' lateral:   10.10 cm/s LVOT diam:     2.30 cm   LV E/e' lateral: 15.2 LVOT Area:     4.15 cm  RIGHT VENTRICLE          IVC RV Basal diam:  3.20 cm  IVC diam: 1.90 cm LEFT ATRIUM              Index        RIGHT ATRIUM           Index LA diam:        4.00  cm  1.84 cm/m   RA Area:     23.70 cm LA Vol (A2C):   122.0 ml 56.20 ml/m  RA Volume:   69.70 ml  32.11 ml/m LA Vol (A4C):   136.0 ml 62.65 ml/m LA Biplane Vol: 130.0 ml 59.88 ml/m  AORTIC VALVE AV Vmax:      241.00 cm/s AV Vmean:     163.000 cm/s AV VTI:       0.404 m AV Peak Grad: 23.2 mmHg AV Mean Grad: 12.0 mmHg  AORTA Ao Root diam: 3.60 cm Ao Asc diam:  3.70 cm MITRAL VALVE MV Area (PHT): 4.49 cm  SHUNTS MV Decel Time: 169 msec     Systemic Diam: 2.30 cm MV E velocity: 154.00 cm/s MV A velocity: 84.60 cm/s MV E/A ratio:  1.82 Dina Rich MD Electronically signed by Dina Rich MD Signature Date/Time: 02/16/2021/4:06:35 PM    Final    IR PERCUTANEOUS ART THROMBECTOMY/INFUSION INTRACRANIAL INC DIAG ANGIO  Result Date: 02/19/2021 INDICATION: New onset of global aphasia, and right-sided hemiparesis. Occluded inferior division of the left middle cerebral artery on CT angiogram the head and neck. EXAM: 1. EMERGENT LARGE VESSEL OCCLUSION THROMBOLYSIS (anterior CIRCULATION) COMPARISON:  CT angiogram of the head and neck of February 16, 2021. MEDICATIONS: Ancef 2 g IV antibiotic was administered within 1 hour of the procedure. ANESTHESIA/SEDATION: General anesthesia. CONTRAST:  Omnipaque 350 approximately 180 mL. FLUOROSCOPY TIME:  Fluoroscopy Time: 75 minutes 54 seconds (3829 mGy). COMPLICATIONS: None immediate. TECHNIQUE: Following a full explanation of the procedure along with the potential associated complications, an informed witnessed consent was obtained. The risks of intracranial hemorrhage of 10%, worsening neurological deficit, ventilator dependency, death and inability to revascularize were all reviewed in detail with the patient's mother. The patient was then put under general anesthesia by the Department of Anesthesiology at [facility]. The right groin was prepped and draped in the usual sterile fashion. Thereafter using modified Seldinger technique, transfemoral access into the  right common femoral artery was obtained without difficulty. Over a 0.035 inch guidewire an 8 [place] Pinnacle 257 sheath was inserted. Through this, and also over a 0.035 inch guidewire a 5.5 French Simmons 2 catheter inside of an 087 97 balloon guide catheter combination was advanced to the aortic arch region. Access was obtained into the left common carotid artery over an 035 inch glidewire and support catheter, followed by the advancement of an 087 balloon guide catheter into the mid 1/3 of the left ICA. The guidewire and the support catheter were removed. Good aspiration obtained from the hub of the 087 balloon guide catheter. A control arteriogram performed through this demonstrated patency at the bulb to the cranial skull base. The petrous, the cavernous and the supraclinoid segments demonstrate wide patency. The left anterior cerebral artery opacifies into the capillary and venous phases. Seen are multiple focal areas of moderate to moderately severe stenosis of the pericallosal and callosal marginal branches most consistent with intracranial arteriosclerosis. The left middle cerebral artery demonstrates patency of the left M1 segment. Patency is seen of the superior division and the mid branch of the trifurcation which demonstrates 2 branches supplying the more superior anterior portions of the frontal parietal regions. There is complete occlusion of the far distal inferior division in its mid M2 region. A prominent area of hypoperfusion is noted in the left posterior parietal and angular regions. The superior division demonstrates also focal areas of caliber irregularity as does the more superior branch of the inferior division in the M2 M3 region most consistent with intracranial arteriosclerosis. FINDINGS: As above. PROCEDURE: Through the combination of an 055 132 cm Zoom aspiration catheter inside of which was a 162 cm 021 microcatheter advanced over a 0.014 inch standard Synchro micro guidewire with a  moderate J configuration to the supraclinoid left ICA. Using a torque device, access was obtained into the occluded inferior division with the micro guidewire which was advanced to the distal M2 M3 region followed by the microcatheter. The guidewire was removed. Good aspiration obtained from the hub of the microcatheter. A gentle control arteriogram performed through the microcatheter demonstrated safe position of the  tip of the microcatheter which was then connected to continuous heparinized saline infusion. A 3 mm x 40 mm Solitaire X retrieval device was then advanced to the distal end of microcatheter. The O ring on the delivery microcatheter was loosened. With slight forward gentle traction with the right hand on the delivery micro guidewire with the left hand the delivery microcatheter was retrieved deploying the distal and the proximal portion of the retrieval device. The proximal portion of the retrieval device was just proximal to the occluded segment of the inferior division. The Zoom aspiration catheter was then advanced to the origin of the occluded inferior division. With proximal flow arrest in the left internal carotid artery, and aspiration through the 55 Zoom aspiration catheter with a Penumbra aspiration device for approximately 3 minutes, the combination of the retrieval device, the microcatheter, and Zoom aspiration catheter was retrieved and removed. Control arteriogram performed following reversal of flow arrest in the left internal carotid artery demonstrated now complete revascularization of the occluded inferior division. A TICI 3 revascularization was achieved. This also revealed focal areas of moderate to severe smooth stenosis of the proximal portion of the previously occluded inferior division. Heterogeneous clot was noted in the retrieval device. Patient was treated with 3 aliquots of nitroglycerin anteriorly with modest relief of the previously noted vasospasm. However, there persisted  severe stenosis at the site of the previously noted occlusion probably related to severe intracranial arteriosclerosis given the patient had global focal areas suggestive of intracranial arteriosclerosis in the anterior and the middle cerebral artery circulation as mentioned above. A control arteriogram performed 5 minutes post revascularization demonstrated complete reocclusion of the previously revascularized branch. This prompted a second pass with a 3 mm x 40 mm retrieval device as mentioned above. After having safely catheterized the inferior division M2 M3 region, a 3 mm x 40 mm Solitaire retrieval device was deployed for approximately 3 minutes. Thereafter with proximal flow arrest in the left internal carotid artery and aspiration with the Zoom aspiration catheter just inside the origin of the inferior division with a Penumbra aspiration device, and with a 20 mL syringe at the hub of the balloon aspiration catheter, the combination of the retrieval device, the microcatheter and the Zoom aspiration catheter were retrieved and removed. Following reversal of flow arrest, a control arteriogram performed through the balloon guide in the left internal carotid artery now demonstrated revascularization of the occluded left middle cerebral inferior division. A TICI 2C revascularization was now noted. Moderate spasm in the revascularized inferior division was treated with 2 aliquots of 25 mcg of nitroglycerin intra-arterially. Control arteriogram performed 5 minutes later, demonstrated reocclusion of the inferior division. Measurements were made of the revascularized inferior division just proximal and distal to the site of the occlusion from the previous arteriograms. A rescue stent was concerted at this time. Following further discussions with the neuro hospitalist, it was decided to initially see a response to a 3 mm 40 mm Solitaire X retrieval device in terms of radial force. A 3 x 40 mm Solitaire device was again  deployed again as mentioned above at the site of the re-stenosis. This demonstrated significant continued narrowing in the retrieval suggestive of a firm atherosclerotic plaque. This vessel was deemed too small for a balloon mountable stent or a balloon angioplasty, with increased risk of rupture. A third pass was then considered with contact aspiration using a 055 Zoom aspiration catheter which was advanced into the occluded proximal segment for 3 minutes followed by retrieval  with aspiration with a Penumbra aspiration device. A control arteriogram performed following retrieval and reversal of flow arrest now demonstrated filling defects at the distal end of the left M1 segment with a nonocclusive clot noted in the proximal superior division. A fourth pass was then advanced using a 055 Zoom aspiration catheter, inside of which was again an 021 162 cm Trevo Trak microcatheter advanced over a 0.014 inch standard Synchro micro guidewire at this time through the prominent superior branch of the inferior division. Contact aspiration was now performed upon removal of the microcatheter using a Penumbra aspiration device for approximately 3 minutes. Following this the Zoom aspiration catheter was retrieved and removed. A control arteriogram performed following flow arrest reversal in the left internal carotid artery now demonstrated patency of the superior division, the branches of the inferior division as noted previously. The occluded inferior division proximally as before. Overall, the patient's cerebrovascular circulation in the left MCA distribution was returned to the pre treatment level. No further attempts were made for revascularization of the inferior division given the reocclusion due to the severely stenotic proximal aspect of this branch due to intracranial arteriosclerosis. The balloon guide catheter was retrieved and removed. An 8 French Angio-Seal closure device was then deployed for hemostasis following  removal of the 25 cm 8 [place] sheath. Distal pulses remained Dopplerable in both feet unchanged. A CT of the brain performed immediately at the end of the procedure continued to demonstrate no gross evidence of intracranial hemorrhage. There was a mild amount of hyperattenuation noted along the posterior insular region with early contrast stain in the left putamen. No gross mass effect or midline shift was noted. Areas of hypoattenuation noted prior to the procedure remained stable. The ventricles remain stable as well. Patient was then transferred intubated as the COVID test results were not available. IMPRESSION: Status post endovascular revascularization of occluded prominent branches of the inferior division of the left middle cerebral artery with 2 passes with a 3 mm x 40 mm Solitaire X retrieval device achieving a TICI 3, and a TICI 2C revascularizations with reocclusion. Unsuccessful attempt at revascularization of the occluded inferior division branch with 2 passes with contact aspiration subsequent to reocclusion. PLAN: As per referring MD. Electronically Signed   By: Julieanne Cotton M.D.   On: 02/19/2021 08:15   CT HEAD CODE STROKE WO CONTRAST  Result Date: 02/16/2021 CLINICAL DATA:  Code stroke.  Aphasia EXAM: CT HEAD WITHOUT CONTRAST TECHNIQUE: Contiguous axial images were obtained from the base of the skull through the vertex without intravenous contrast. COMPARISON:  None. FINDINGS: Brain: There is no mass, hemorrhage or extra-axial collection. The size and configuration of the ventricles and extra-axial CSF spaces are normal. There are old infarcts of the cerebellum, left frontal operculum and right parietal lobe. There is periventricular hypoattenuation compatible with chronic microvascular disease. Vascular: No abnormal hyperdensity of the major intracranial arteries or dural venous sinuses. No intracranial atherosclerosis. Skull: The visualized skull base, calvarium and extracranial soft  tissues are normal. Sinuses/Orbits: No fluid levels or advanced mucosal thickening of the visualized paranasal sinuses. No mastoid or middle ear effusion. The orbits are normal. ASPECTS [facility] Stroke Program Early CT Score) - Ganglionic level infarction (caudate, lentiform nuclei, internal capsule, insula, M1-M3 cortex): 7 - Supraganglionic infarction (M4-M6 cortex): 3 Total score (0-10 with 10 being normal): 10 IMPRESSION: 1. No acute intracranial abnormality. 2. 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 3. ASPECTS is 10. These results were  called by telephone at the time of interpretation on 02/16/2021 at 12:08 am to provider [facility] , who verbally acknowledged these results. Electronically Signed   By: Deatra Robinson M.D.   On: 02/16/2021 00:09   CT ANGIO HEAD NECK W WO CM W PERF (CODE STROKE)  Result Date: 02/16/2021 CLINICAL DATA:  Aphasia.  History of multiple strokes. EXAM: CT ANGIOGRAPHY HEAD AND NECK CT PERFUSION BRAIN TECHNIQUE: Multidetector CT imaging of the head and neck was performed using the standard protocol during bolus administration of intravenous contrast. Multiplanar CT image reconstructions and MIPs were obtained to evaluate the vascular anatomy. Carotid stenosis measurements (when applicable) are obtained utilizing NASCET criteria, using the distal internal carotid diameter as the denominator. Multiphase CT imaging of the brain was performed following IV bolus contrast injection. Subsequent parametric perfusion maps were calculated using RAPID software. CONTRAST:  75mL OMNIPAQUE IOHEXOL 350 MG/ML SOLN COMPARISON:  None. FINDINGS: CTA NECK FINDINGS SKELETON: There is no bony spinal canal stenosis. No lytic or blastic lesion. OTHER NECK: Normal pharynx, larynx and major salivary glands. No cervical lymphadenopathy. Unremarkable thyroid gland. UPPER CHEST: No pneumothorax or pleural effusion. No nodules or masses. AORTIC ARCH: There is no  calcific atherosclerosis of the aortic arch. There is no aneurysm, dissection or hemodynamically significant stenosis of the visualized portion of the aorta. Conventional 3 vessel aortic branching pattern. The visualized proximal subclavian arteries are widely patent. RIGHT CAROTID SYSTEM: Normal without aneurysm, dissection or stenosis. LEFT CAROTID SYSTEM: Normal without aneurysm, dissection or stenosis. VERTEBRAL ARTERIES: Left dominant configuration. Both origins are clearly patent. There is no dissection, occlusion or flow-limiting stenosis to the skull base (V1-V3 segments). CTA HEAD FINDINGS POSTERIOR CIRCULATION: --Vertebral arteries: Normal V4 segments. --Inferior cerebellar arteries: Normal. --Basilar artery: Normal. --Superior cerebellar arteries: Normal. --Posterior cerebral arteries (PCA): Normal. ANTERIOR CIRCULATION: --Intracranial internal carotid arteries: Normal. --Anterior cerebral arteries (ACA): Normal. Both A1 segments are present. Patent anterior communicating artery (a-comm). --Middle cerebral arteries (MCA): There is occlusion of the proximal M2 inferior division of the left middle cerebral artery. The right MCA is unremarkable. VENOUS SINUSES: As permitted by contrast timing, patent. ANATOMIC VARIANTS: None Review of the MIP images confirms the above findings. CT Brain Perfusion Findings: ASPECTS: 10 CBF (<30%) Volume: 0mL Perfusion (Tmax>6.0s) volume: 74mL Mismatch Volume: 74mL Infarction Location:Posterior left MCA territory deficit corresponds to the area supplied by the occluded M2 branch of the left MCA. The other deficit areas in the left cerebellum and right temporal lobe may be artifactual, as there is no visualized treatable occlusion. IMPRESSION: 1. Occlusion of the proximal M2 inferior division of the left middle cerebral artery. 2. Area of ischemia in the posterior left MCA territory corresponding to the area supplied by the occluded vessel. 3. Other areas of perfusion deficit  in the left cerebellum and right temporal lobe are less certain. Critical Value/emergent results were called by telephone at the time of interpretation on 02/16/2021 at 1:33 am to provider Dr. Don Perking, who verbally acknowledged these results. Electronically Signed   By: Deatra Robinson M.D.   On: 02/16/2021 01:34      HISTORY OF PRESENT ILLNESS   [name] is a 45 y.o. female with a history of previous stroke who was last known to be normal around 530.  Around nine, she was having difficulty speaking, but it is unclear exactly when this started.  She was brought into the emergency department at [facility] where she was evaluated by teleneurology.  She was outside the  window for tenecteplase, but a CTA/CTP was performed.  This demonstrated an area of ischemia in the left parietal region and an M2 occlusion.  Aspects was 10.   Due to this, she was brought to [facility] emergently for thrombectomy.      HOSPITAL COURSE  She underwent a left common carotid arteriogram 02/16/21   S/P revascularization of occluded Inf division of Lt MCA with x1 pass with 40 mm solitairex retriever and contact aspiration achieving a TICI 3 reperfusion. 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 Reopened  with sec pass with combination as above.achieving a TICI 2C .  Reccluded ,with unsuccessful x 2 contact aspirations . Final TICI score 2b/2C. Post CT brain Lt basal ganglia blush and mild Lt ant parietal subarachnoid hyperdensity.  No mass effect .  Throughout hospitalization she remained globally aphasic with right hemiparesis, left gaze deviation, and spontaneous movements of right leg.     Seen by PT who recommend acute inpatient rehab.      DISCHARGE EXAM Blood pressure (!) 147/84, pulse 67, temperature 98.6 F (37 C), temperature source Oral, resp. rate 18, height 5\' 5"  (1.651 m), SpO2 100 %.  General - obese middle-aged African-American lady, in no apparent distress.   Ophthalmologic -  fundi not visualized due to noncooperation.   Cardiovascular - Regular rhythm and rate.   Mental Status -  She is drowsy but easily aroused. She is globally aphasic, does not follow commands.  She ma kes a few guttural sounds but not able to speak clear words or sentences.    Cranial Nerves II - XII - II - unable to assess III, IV, VI - left gaze preference.  Will not follow to the right cross midline.  She blinks to threat on the left more than right V - Facial sensation intact bilaterally. VII - Facial movement intact bilaterally.  But diminished on the right VIII - Hearing & vestibular intact bilaterally. X -unable to assess XI - unable to assess XII - Tongue protrusion intact.   Motor Strength - Right arm is flaccid. LUE, LLE and RLE antigravity and moving spontaneously.  Bulk was normal and fasciculations were absent.   Motor Tone - Muscle tone was assessed at the neck and appendages and was normal.   Sensory - Light touch, temperature/pinprick were assessed and were symmetrical.     Coordination - unable to assess   Gait and Station - deferred.   Discharge Diet       Diet   DIET DYS 3 Room service appropriate? Yes; Fluid consistency: Thin   liquids  DISCHARGE PLAN Disposition:  CIR aspirin 81 mg daily and clopidogrel 75 mg daily for secondary stroke prevention for 3 months given intracranial atherosclerosis likely etiology of her stroke then   plavix alone. Ongoing stroke risk factor control by Primary Care Physician at time of discharge Follow-up PCP Cobb, , FNP in 2 weeks. Follow-up in [facility] in 4 weeks, office to schedule an appointment.   45 minutes were spent preparing discharge.    I have personally obtained history,examined this patient, reviewed notes, independently viewed imaging studies, participated in medical decision making and plan of care.ROS completed by me personally and pertinent positives fully  documented  I have made any additions or clarifications directly to the above note. Agree with note above.    Glenice Laine, MD Medical Director [facility] Pager: [phone] 02/21/2021 12:04 PM

## 2021-02-21 NOTE — H&P (Signed)
Physical Medicine and Rehabilitation Admission H&P     CC: Functional deficits due to stroke     HPI: [name] is a 45 year old female with history of HTN, multiple prior CVA who was admitted via [facility] on 02/16/21 with right sided weakness and inability to speak. CTA/P  head was done showing perfusion mismatch w/ ischemia in left parietal region and M2 occlusion. She was transferred to [facility] and underwent cerebral angio with thrombectomy of Left M2 reoccusion and 2B revascularization only.  MRI brain done showing acute large MCA and left PICA territory infarct with mild mass-effect, additional multiple small acute infarcts in bilateral cerebral hemispheres suggestive of embolic event and advanced chronic white matter disease.  2D echo done showing EF of 60 to 65% with no wall abnormality and severely dilated LA and mild dilatation of ascending aorta -37 mm.  Dr. Pearlean Brownie felt the stroke was embolic secondary to intracranial left MCA atherosclerosis and DAPT x3 months recommended. She has had issues with hypokalemia requiring runs of K.   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  Therapy ongoing and patient continues to have patient scheduled right-sided weakness with aphasia and apraxia, left gaze deviation, visual deficits, sleep-wake disruption as well as question of dizziness with activity.  CIR recommended due to functional decline     Review of Systems  Unable to perform ROS: Patient nonverbal          Past Medical History:  Diagnosis Date   Anemia     Hypertension     Low back pain     Stroke [facility])     Thyroid goiter             Past Surgical History:  Procedure Laterality Date   CESAREAN SECTION       IR CT HEAD LTD   02/16/2021   IR CT HEAD LTD   02/16/2021   IR CT HEAD LTD   02/16/2021   IR CT HEAD LTD   02/16/2021   IR PERCUTANEOUS ART THROMBECTOMY/INFUSION INTRACRANIAL INC DIAG  ANGIO   02/16/2021   RADIOLOGY WITH ANESTHESIA N/A 02/16/2021    Procedure: IR WITH ANESTHESIA;  Surgeon: Julieanne Cotton, MD;  Location: [facility] OR;  Service: Radiology;  Laterality: N/A;   THYROIDECTOMY          Social History:  Lives alone. Worked as a Lawyer for [employer]. Per  reports that she has never smoked. She has been exposed to tobacco smoke. She has never used smokeless tobacco. Per reports current alcohol use. Per reports current drug use. Drug: Marijuana.         Allergies  Allergen Reactions   Labetalol Hcl              Medications Prior to Admission  Medication Sig Dispense Refill   acetaminophen (TYLENOL) 500 MG tablet Take 500-1,000 mg by mouth daily as needed.       amLODipine (NORVASC) 10 MG tablet Take 10 mg by mouth daily.       aspirin 81 MG EC tablet Take 81 mg by mouth daily.       atorvastatin (LIPITOR) 80 MG tablet Take 80 mg by mouth daily.       cetirizine (ZYRTEC ALLERGY) 10 MG tablet Take 1 tablet (10 mg total) by mouth daily. (Patient taking differently: Take 10 mg by mouth daily as needed.) 30 tablet 0  ergocalciferol (VITAMIN D2) 1.25 MG (50000 UT) capsule Take 50,000 Units by mouth once a week. On wednesdays       hydrochlorothiazide (HYDRODIURIL) 25 MG tablet Take 25 mg by mouth daily.       lisinopril (ZESTRIL) 40 MG tablet Take 40 mg by mouth daily.       metoprolol succinate (TOPROL-XL) 50 MG 24 hr tablet Take 50 mg by mouth 2 (two) times daily.       omeprazole (PRILOSEC) 40 MG capsule Take 40 mg by mouth daily.       spironolactone (ALDACTONE) 50 MG tablet Take 50 mg by mouth daily.          Drug Regimen Review  Drug regimen was reviewed and remains appropriate with no significant issues identified   Home: Home Living Family/patient expects to be discharged to:: Private residence Living Arrangements: Alone Available Help at Discharge: Available PRN/intermittently Type of Home: House Home Access: Stairs to enter [employer] of Steps:  2 Entrance Stairs-Rails: None Home Layout: One level Bathroom Shower/Tub: Health visitor: Standard Home Equipment: None Additional Comments: has 3 children (19 yr 24 yo 53 yo) kids have been living with grandmother after passing of grandfather. 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) Mobility Comments: none ADLs Comments: working as Lawyer and driving per family   Functional Status:  Mobility: Bed Mobility Overal bed mobility: Needs Assistance Bed Mobility: Supine to Sit Supine to sit: Mod assist General bed mobility comments: Received in chair and in chair at end of session Transfers Overall transfer level: Needs assistance Equipment used: 1 person hand held assist Transfers: Sit to/from Stand, Bed to chair/wheelchair/BSC Sit to Stand: Min guard (No physical assist given) Bed to/from chair/wheelchair/BSC transfer type:: Step pivot Step pivot transfers: Min assist General transfer comment: pt is impulsive with standing, needs frequent cues to sit down and wait, but once prepared standing well with minA and no LOB with pivotal steps; then performed STS x 5 reps with single UE support from chair Ambulation/Gait Ambulation/Gait assistance: Min guard, +2 safety/equipment (Chair follow) Gait Distance (Feet): 40 Feet (40 ft, seated break 65 ft) Assistive device: None (Therapist with hand out for HHA if needed, pt did not utilize) Gait Pattern/deviations: Step-through pattern, Decreased stride length, Wide base of support General Gait Details: Pt stable with ambulation without HHA and up to minG physical assist. Pt was able to look towards R side when heavily cued, but not able to hold attention long or read any of the room numbers. Pt took a seated rest break and nodded "yes" when asked if dizzy, but  later nodded "no" when asked if she was dizzy during ambulation. Unable to read room numbers, even when directly facing wall Gait velocity: decreased Pre-gait activities: standing hip flexion x10 reps x2 sets with tapping/multimodal cues for sequencing   ADL: ADL Overall ADL's : Needs assistance/impaired Eating/Feeding: NPO Grooming: Wash/dry hands, Wash/dry face, Maximal assistance, Standing Grooming Details (indicate cue type and reason): Min A for standing balance, Max A due to difficulty turning sink on and off, finding the running water, initially engaging the RUE Upper Body Dressing : Moderate assistance Lower Body Dressing: Maximal assistance Lower Body Dressing Details (indicate cue type and reason): attempting to place sock on foot and demonstrates R LE figure 4 supine. pt sitting for  L LE and attempting to bend over with L hand only use. Toilet Transfer: Moderate assistance, Ambulation Toilet Transfer Details (indicate cue type and reason): Mod A due to difficulty find seat and mild lean towards L side when walking Toileting- Clothing Manipulation and Hygiene: Total assistance Toileting - Clothing Manipulation Details (indicate cue type and reason): given toilet paper and does not initiate at all Functional mobility during ADLs: Moderate assistance General ADL Comments: Pt standing well at sink, weight bearing through RUE. Pt having difficulty seeing object and potentially remembering how to use some objects.   Cognition: Cognition Overall Cognitive Status: Difficult to assess Arousal/Alertness: Awake/alert Orientation Level: Oriented to person Attention: Sustained Sustained Attention: Impaired Sustained Attention Impairment: Functional basic Memory:  (unable to assess) Awareness: Impaired Awareness Impairment: Intellectual impairment, Emergent impairment, Anticipatory impairment Problem Solving: Impaired Problem Solving Impairment: Functional basic, Verbal  basic Safety/Judgment: Impaired Cognition Arousal/Alertness: Awake/alert Behavior During Therapy: Flat affect Overall Cognitive Status: Difficult to assess Area of Impairment: Following commands, Safety/judgement, Attention Current Attention Level: Focused Following Commands: Follows one step commands with increased time, Follows one step commands inconsistently Safety/Judgement: Decreased awareness of safety, Decreased awareness of deficits General Comments: Pt impulsively standing up or sitting down when cued to lift feet up or to wait in chair, possibly some receptive aphasia in addition to expressive aphasia. pt has more difficulty following commands when therapist on her R side. At end of session pt looked at daughter on R side and smiled. Difficult to assess due to: Impaired communication     Blood pressure (!) 147/84, pulse 67, temperature 98.6 F (37 C), temperature source Oral, resp. rate 18, height 5\' 5"  (1.651 m), SpO2 100 %. Physical Exam Vitals reviewed.  Constitutional:      Appearance: Normal appearance. She is obese.  HENT:     Head: Normocephalic and atraumatic.     Right Ear: External ear normal.     Left Ear: External ear normal.     Nose: Nose normal.     Mouth/Throat:     Mouth: Mucous membranes are moist.     Pharynx: Oropharynx is clear.  Eyes:     Extraocular Movements: Extraocular movements intact.     Conjunctiva/sclera: Conjunctivae normal.  Cardiovascular:     Rate and Rhythm: Normal rate and regular rhythm.     Heart sounds: No murmur heard.   No gallop.  Pulmonary:     Effort: Pulmonary effort is normal. No respiratory distress.     Breath sounds: No wheezing.  Abdominal:     General: There is no distension.     Palpations: Abdomen is soft.     Tenderness: There is no abdominal tenderness.  Musculoskeletal:        General: No swelling. Normal range of motion.     Cervical back: Normal range of motion.  Skin:    General: Skin is warm and dry.   Neurological:     Mental Status: She is alert.     Comments: Alert, makes eye contact. Expressive and receptive aphasia/apraxia.  Able to occasionally follow a simple command. Appears to have right HH and/or neglect. Right central 7 and tongue deviation. RUE ?2-3/5 with apraxia and inconsistent effort. RLE grossly 4/5. Decreased sense of pain and LT RUE and RLE. No abnl tone appreciated  Psychiatric:     Comments: Appears to be pleasant and up beat.       Lab Results Last 48 Hours        Results for  orders placed or performed during the hospital encounter of 02/16/21 (from the past 48 hour(s))  Magnesium     Status: None    Collection Time: 02/20/21  4:14 AM  Result Value Ref Range    Magnesium 1.9 1.7 - 2.4 mg/dL      Comment: Performed at [facility], [address], [place], [place] [address]  Basic metabolic panel     Status: Abnormal    Collection Time: 02/20/21  4:14 AM  Result Value Ref Range    Sodium 131 (L) 135 - 145 mmol/L    Potassium 3.8 3.5 - 5.1 mmol/L    Chloride 99 98 - 111 mmol/L    CO2 24 22 - 32 mmol/L    Glucose, Bld 105 (H) 70 - 99 mg/dL      Comment: Glucose reference range applies only to samples taken after fasting for at least 8 hours.    BUN 13 6 - 20 mg/dL    Creatinine, Ser 6.04 (H) 0.44 - 1.00 mg/dL    Calcium 9.1 8.9 - 54.0 mg/dL    GFR, Estimated >98 >11 mL/min      Comment: (NOTE) Calculated using the CKD-EPI Creatinine Equation (2021)      Anion gap 8 5 - 15      Comment: Performed at [facility], [address] [place], [place] [address]      Imaging Results (Last 48 hours)  No results found.           Medical Problem List and Plan: 1.  Functional deficits secondary to left MCA infarct with associated expressive and receptive aphasia/apraxia, right hemiparesis, and right visual-spatial deficits             -patient may shower             -ELOS/Goals: 16-18 days, supervision to min assist goals with PT, OT, SLP 2.   Antithrombotics: -DVT/anticoagulation:  Pharmaceutical: Lovenox             -antiplatelet therapy: DAPT x3 months 3. Pain Management: N/A 4. Mood: LCSW to follow for evaluation and support             -antipsychotic agents: N/A 5. Neuropsych: This patient is not capable of making decisions on her own behalf. 6. Skin/Wound Care: Routine pressure-relief measures. 7. Fluids/Electrolytes/Nutrition: Monitor intake/output.  Check  CMet in AM. 8.  HTN: Monitor blood pressures TID.              -- Continue metoprolol, lisinopril 9:  Dyslipidemia: HDL- 32.  LDL 58-- > on Lipitor 80 mg 10.  Morbid obesity: BMI 41.  Educated on diet and exercise as well as weight loss to help promote overall health and mobility. 11.  Hypokalemia: Resolved with supplementation. 12.  Acute on chronic renal failure: BUN/serum 25/1.5 admission.    --Was 1.18  in 09/2020.  Monitor with serial checks. 13.  Iron deficiency anemia: Continue iron supplement.   14.  Insomnia: Continue melatonin as well as low-dose Seroquel at bedtime       Jacquelynn Cree, PA-C 02/21/2021   I have personally performed a face to face diagnostic evaluation of this patient and formulated the key components of the plan.  Additionally, I have personally reviewed laboratory data, imaging studies, as well as relevant notes and concur with the physician assistant's documentation above.  The patient's status has not changed from the original H&P.  Any changes in documentation from the acute care  chart have been noted above.  Ranelle Oyster, MD, [place] [facility]

## 2021-02-21 NOTE — Progress Notes (Signed)
PMR Admission Coordinator Pre-Admission Assessment  Patient: [name] is an 45 y.o., female MRN: [id number] DOB: 05/08/1975 Height: 5' 5" (165.1 cm) Weight:    Insurance Information HMO:     PPO:      PCP:      IPA:      80/20:      OTHER:  PRIMARY: Medicaid [employer]      Policy#: [id number]      Subscriber: pt CM Name: Rachel      Phone#: [phone]     Fax#: [phone] Pre-Cert#: [id number] auth for CIR given by Rachel with [employer] with updates due to fax listed above on 11/18.      Employer:  Benefits:  Phone #: [phone]     Name:  Eff. Date: 10/12/19     Deduct:       Out of Pocket Max:       Life Max:  CIR: 100%      SNF:  Outpatient:      Co-Pay:  Home Health:       Co-Pay:  DME:      Co-Pay:  Providers:  SECONDARY:       Policy#:      Phone#:   Financial Counselor:       Phone#:   The "Data Collection Information Summary" for patients in Inpatient Rehabilitation Facilities with attached "Privacy Act Statement-Health Care Records" was provided and verbally reviewed with: N/A  Emergency Contact Information Contact Information     Name Relation Home Work Mobile   noble,gail Mother   [phone]   smith,Damiah Daughter   [phone]       Current Medical History  Patient Admitting Diagnosis: L MCA stroke s/p revascularlization  History of Present Illness: Pt is a 45 y/o female with c/o difficulty speaking.  She was evaluated at [facility] by teleneurology before transfer to [facility].  CTA/CTP performed which demonstrated an area of ischemia in the L parietal region and an M2 occlusion.  She was transferred to [facility] on 11/6 for thrombectomy, which reoccluded.  CT revealed old infarcts. Post IR CT revealed Left basal ganglia blush and mild left anterior parietal subarachnoid hyperdensity.  CT on 11/7 revealed large acute L MCA infarct with confluent cytotoxic edema as well as evidence of left cerebellar PICA territory with no hemorrhagic transformation.  Trace  rightward midline shift and mild mass effect on the 4th ventricle.  Echo normal, LDL 58, HgbA1c 5.7.  Initially requiring cleviprex for BP control. Stroke team recommending asa and plavix.  Seroquel at bedtime for agitation.  Therapy evaluations were completed and pt was recommended for CIR.   Complete NIHSS TOTAL: 11  Patient's medical record from [facility] has been reviewed by the rehabilitation admission coordinator and physician.  Past Medical History  Past Medical History:  Diagnosis Date   Hypertension    Stroke (HCC)     Has the patient had major surgery during 100 days prior to admission? Yes  Family History   family history includes Healthy in her mother.  Current Medications  Current Facility-Administered Medications:     stroke: mapping our early stages of recovery book, , Does not apply, Once, Kirkpatrick, McNeill P, MD   acetaminophen (TYLENOL) tablet 650 mg, 650 mg, Oral, Q4H PRN, 650 mg at 02/20/21 0906 **OR** acetaminophen (TYLENOL) 160 MG/5ML solution 650 mg, 650 mg, Per Tube, Q4H PRN **OR** acetaminophen (TYLENOL) suppository 650 mg, 650 mg, Rectal, Q4H PRN, Kirkpatrick, McNeill P, MD, 650 mg at 02/17/21 0449     amLODipine (NORVASC) tablet 10 mg, 10 mg, Oral, Daily, Bowser, Grace E, NP, 10 mg at 02/21/21 0800   aspirin EC tablet 81 mg, 81 mg, Oral, Daily, Sethi, Pramod S, MD, 81 mg at 02/21/21 0759   atorvastatin (LIPITOR) tablet 40 mg, 40 mg, Oral, Daily, Wolfe, Denise A, NP, 40 mg at 02/21/21 0800   Chlorhexidine Gluconate Cloth 2 % PADS 6 each, 6 each, Topical, Daily, Kirkpatrick, McNeill P, MD, 6 each at 02/18/21 1653   clopidogrel (PLAVIX) tablet 75 mg, 75 mg, Oral, Daily, Sethi, Pramod S, MD, 75 mg at 02/21/21 0800   ferrous gluconate (FERGON) tablet 324 mg, 324 mg, Oral, TID WC, Chand, Sudham, MD, 324 mg at 02/21/21 0800   hydrALAZINE (APRESOLINE) injection 10 mg, 10 mg, Intravenous, Q4H PRN, Chand, Sudham, MD, 10 mg at 02/18/21 0642   lisinopril (ZESTRIL)  tablet 20 mg, 20 mg, Oral, Daily, Sethi, Pramod S, MD, 20 mg at 02/21/21 0800   LORazepam (ATIVAN) injection 0.5 mg, 0.5 mg, Intravenous, Once, Wolfe, Denise A, NP   MEDLINE mouth rinse, 15 mL, Mouth Rinse, BID, Sethi, Pramod S, MD, 15 mL at 02/21/21 1059   melatonin tablet 3 mg, 3 mg, Oral, QHS, Khaliqdina, Salman, MD, 3 mg at 02/20/21 2123   metoprolol tartrate (LOPRESSOR) tablet 50 mg, 50 mg, Oral, BID, Bowser, Grace E, NP, 50 mg at 02/21/21 0535   pantoprazole (PROTONIX) EC tablet 40 mg, 40 mg, Oral, QHS, Sethi, Pramod S, MD, 40 mg at 02/20/21 2123   QUEtiapine (SEROQUEL) tablet 25 mg, 25 mg, Oral, QHS, Sethi, Pramod S, MD, 25 mg at 02/20/21 2123  Patients Current Diet:  Diet Order             DIET DYS 3 Room service appropriate? Yes; Fluid consistency: Thin  Diet effective now                   Precautions / Restrictions Precautions Precautions: Fall Precaution Comments: R neglect, eyes not crossing midline to R, aphasia, SBP <180, apraxia Restrictions Weight Bearing Restrictions: No   Has the patient had 2 or more falls or a fall with injury in the past year? 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 Independent  Indoor Mobility: Did the patient need assistance with walking from room to room (with or without device)? Independent  Stairs: Did the patient need assistance with internal or external stairs (with or without device)? Independent  Functional Cognition: Did the patient need help planning regular tasks such as shopping or remembering to take medications? Independent  Patient Information Are you of Hispanic, Latino/a,or Spanish origin?: A. No, not of Hispanic, Latino/a, or Spanish origin What is your race?: B. Black or African American Do you need or want an interpreter to communicate with a  doctor or health care staff?: 0. No  Patient's Response To:  Health Literacy and Transportation Is the patient able to respond to health literacy and transportation needs?: No Health Literacy - How often do you need to have someone help you when you read instructions, pamphlets, or other written material from your doctor or pharmacy?: Patient unable to respond In the past 12 months, has lack of transportation kept you from medical appointments or from getting medications?: No In the past 12 months, has lack of transportation kept you from meetings, work, or from getting   things needed for daily living?: No  Home Assistive Devices / Equipment Home Equipment: None  Prior Device Use: Indicate devices/aids used by the patient prior to current illness, exacerbation or injury? None of the above  Current Functional Level Cognition  Arousal/Alertness: Awake/alert Overall Cognitive Status: Impaired/Different from baseline Difficult to assess due to: Impaired communication Current Attention Level: Focused Orientation Level: Oriented to person Following Commands: Follows one step commands with increased time Safety/Judgement: Decreased awareness of safety, Decreased awareness of deficits General Comments: respongs to most 1 step commands, Difficulty at the sinkwith all tasks, unsure if it is cog related or vision related Attention: Sustained Sustained Attention: Impaired Sustained Attention Impairment: Functional basic Memory:  (unable to assess) Awareness: Impaired Awareness Impairment: Intellectual impairment, Emergent impairment, Anticipatory impairment Problem Solving: Impaired Problem Solving Impairment: Functional basic, Verbal basic Safety/Judgment: Impaired    Extremity Assessment (includes Sensation/Coordination)  Upper Extremity Assessment: RUE deficits/detail RUE Deficits / Details: not following commands. pt demonstrates some shoulder flexion when touching with L UE RUE  Sensation: decreased light touch RUE Coordination: decreased fine motor, decreased gross motor  Lower Extremity Assessment: Defer to PT evaluation RLE Deficits / Details: difficult to assess given inattention; observed performing hip and knee flexion to attempt to don sock, otherwise does not move RLE on commands. Full PROM RLE RLE Sensation: decreased proprioception, decreased light touch (withdrawals to pain RLE)    ADLs  Overall ADL's : Needs assistance/impaired Eating/Feeding: NPO Grooming: Wash/dry hands, Wash/dry face, Maximal assistance, Standing Grooming Details (indicate cue type and reason): Min A for standing balance, Max A due to difficulty turning sink on and off, finding the running water, initially engaging the RUE Upper Body Dressing : Moderate assistance Lower Body Dressing: Maximal assistance Lower Body Dressing Details (indicate cue type and reason): attempting to place sock on foot and demonstrates R LE figure 4 supine. pt sitting for L LE and attempting to bend over with L hand only use. Toilet Transfer: Moderate assistance, Ambulation Toilet Transfer Details (indicate cue type and reason): Mod A due to difficulty find seat and mild lean towards L side when walking Toileting- Clothing Manipulation and Hygiene: Total assistance Toileting - Clothing Manipulation Details (indicate cue type and reason): given toilet paper and does not initiate at all Functional mobility during ADLs: Moderate assistance General ADL Comments: Pt standing well at sink, weight bearing through RUE. Pt having difficulty seeing object and potentially remembering how to use some objects.    Mobility  Overal bed mobility: Needs Assistance Bed Mobility: Supine to Sit Supine to sit: Mod assist General bed mobility comments: Up in chair    Transfers  Overall transfer level: Needs assistance Equipment used: 1 person hand held assist Transfers: Sit to/from Stand Sit to Stand: Min assist Bed to/from  chair/wheelchair/BSC transfer type:: Step pivot Step pivot transfers: Min assist, +2 safety/equipment General transfer comment: Pt powering up with min A for steadying    Ambulation / Gait / Stairs / Wheelchair Mobility  Ambulation/Gait Ambulation/Gait assistance: Min assist, +2 safety/equipment, Mod assist Gait Distance (Feet): 70 Feet (x 3) Assistive device: 1 person hand held assist Gait Pattern/deviations: Step-through pattern, Decreased stride length, Wide base of support General Gait Details: initially holding L UE and pt stable, sat to rest, then assisted with R UE and pt reaching with L to hold wall, furniture etc as not aware I was on her R side despite cues and my arm encircling her waist. sat to rest again then walked back to room with   approximation of R arm to her body, she could not find her room number on the R until I turned her around so she was facing the opposite direction.  Same thing when trying to help her find the bathroom in her room. Gait velocity: decr Pre-gait activities: standing hip flexion x10 reps x2 sets with tapping/multimodal cues for sequencing    Posture / Balance Balance Overall balance assessment: Needs assistance Sitting-balance support: No upper extremity supported, Feet supported Sitting balance-Leahy Scale: Good Standing balance support: Single extremity supported, During functional activity Standing balance-Leahy Scale: Fair Standing balance comment: static standing without UE support, for any dynamic activity needs UE support    Special needs/care consideration none   Previous Home Environment (from acute therapy documentation) Living Arrangements: Alone  Lives With:  (unable to determine) Available Help at Discharge: Available PRN/intermittently Type of Home: House Home Layout: One level Home Access: Stairs to enter Entrance Stairs-Rails: None Entrance Stairs-Number of Steps: 2 Bathroom Shower/Tub: Walk-in shower Bathroom Toilet:  Standard Additional Comments: has 3 children (19 yr 16 yo 14 yo) kids have been living with grandmother after passing of grandfather. 19 yo dgter taking online college classes and could help provide care  Discharge Living Setting Plans for Discharge Living Setting: Lives with (comment) (3 teenaged children) Type of Home at Discharge: House Discharge Home Layout: One level Discharge Home Access: Stairs to enter Entrance Stairs-Rails: None Entrance Stairs-Number of Steps: 2 Discharge Bathroom Shower/Tub: Walk-in shower Discharge Bathroom Toilet: Standard Discharge Bathroom Accessibility: Yes How Accessible: Accessible via walker Does the patient have any problems obtaining your medications?: No  Social/Family/Support Systems Patient Roles: Parent Contact Information: 3 teenaged children, oldest is 19 and taking online college courses Anticipated Caregiver: pt's daughter (Damiah) and mother (Gail, main contact) Anticipated Caregiver's Contact Information: Gail [phone]; Damiah [phone] Ability/Limitations of Caregiver: min assist Caregiver Availability: 24/7 Discharge Plan Discussed with Primary Caregiver: Yes Is Caregiver In Agreement with Plan?: Yes Does Caregiver/Family have Issues with Lodging/Transportation while Pt is in Rehab?: No  Goals Patient/Family Goal for Rehab: PT/OT supervision to min assist, SLP min assist Expected length of stay: 16-18 days Pt/Family Agrees to Admission and willing to participate: Yes Program Orientation Provided & Reviewed with Pt/Caregiver Including Roles  & Responsibilities: Yes  Barriers to Discharge: Insurance for SNF coverage  Decrease burden of Care through IP rehab admission: n/a  Possible need for SNF placement upon discharge: not anticipated. Pt with good family support.   Patient Condition: I have reviewed medical records from [facility], spoken with CM, and patient and family member. I met with patient at the bedside and  discussed via phone for inpatient rehabilitation assessment.  Patient will benefit from ongoing PT, OT, and SLP, can actively participate in 3 hours of therapy a day 5 days of the week, and can make measurable gains during the admission.  Patient will also benefit from the coordinated team approach during an Inpatient Acute Rehabilitation admission.  The patient will receive intensive therapy as well as Rehabilitation physician, nursing, social worker, and care management interventions.  Due to bladder management, bowel management, safety, skin/wound care, disease management, medication administration, pain management, and patient education the patient requires 24 hour a day rehabilitation nursing.  The patient is currently min to mod assist with mobility and basic ADLs.  Discharge setting and therapy post discharge at home with home health is anticipated.  Patient has agreed to participate in the Acute Inpatient Rehabilitation Program and will admit today.  Preadmission Screen Completed   By:  Khyron Garno E Malvika Tung, PT, DPT 02/21/2021 11:03 AM ______________________________________________________________________   Discussed status with Dr. Swartz on 02/21/21  at 11:03 AM  and received approval for admission today.  Admission Coordinator:  Roshaun Pound E Lavaya Defreitas, PT, DPT time 02/21/21 /Date 11:03 AM    Assessment/Plan: Diagnosis: left MCA infarct Does the need for close, 24 hr/day Medical supervision in concert with the patient's rehab needs make it unreasonable for this patient to be served in a less intensive setting? Yes Co-Morbidities requiring supervision/potential complications: HTN, aphasia Due to bladder management, bowel management, safety, skin/wound care, disease management, medication administration, pain management, and patient education, does the patient require 24 hr/day rehab nursing? Yes Does the patient require coordinated care of a physician, rehab nurse, PT, OT, and SLP to address physical and  functional deficits in the context of the above medical diagnosis(es)? Yes Addressing deficits in the following areas: balance, endurance, locomotion, strength, transferring, bowel/bladder control, bathing, dressing, feeding, grooming, toileting, cognition, speech, language, swallowing, and psychosocial support Can the patient actively participate in an intensive therapy program of at least 3 hrs of therapy 5 days a week? Yes The potential for patient to make measurable gains while on inpatient rehab is excellent Anticipated functional outcomes upon discharge from inpatient rehab: supervision and min assist PT, supervision and min assist OT, min assist SLP Estimated rehab length of stay to reach the above functional goals is: 16-18 days Anticipated discharge destination: Home 10. Overall Rehab/Functional Prognosis: excellent   MD Signature: Zachary T. Swartz, MD, FAAPMR [facility] 02/21/2021  

## 2021-02-21 NOTE — Progress Notes (Signed)
Pt wheeled off unit by This RN. Her daughter and mother by her side.

## 2021-02-21 NOTE — Progress Notes (Signed)
Inpatient Rehabilitation Admission Medication Review by a Pharmacist  A complete drug regimen review was completed for this patient to identify any potential clinically significant medication issues.  High Risk Drug Classes Is patient taking? Indication by Medication  Antipsychotic Yes Seroquel for mood/agitation, Compazine for N/V  Anticoagulant Yes Lovenox for VTE ppx  Antibiotic No   Opioid No   Antiplatelet Yes Plavix and Aspirin for CVA  Hypoglycemics/insulin No   Vasoactive Medication Yes Amlodipine, Metoprolol, lisinopril for BP  Chemotherapy No   Other Yes Lipitor for HLD Protonix for GERD     Type of Medication Issue Identified Description of Issue Recommendation(s)  Drug Interaction(s) (clinically significant)     Duplicate Therapy     Allergy     No Medication Administration End Date     Incorrect Dose     Additional Drug Therapy Needed     Significant med changes from prior encounter (inform family/care partners about these prior to discharge).    Other       Clinically significant medication issues were identified that warrant physician communication and completion of prescribed/recommended actions by midnight of the next day:  No  Pharmacist comments:   Time spent performing this drug regimen review (minutes):  20 minutes   Elwin Sleight 02/21/2021 3:14 PM

## 2021-02-21 NOTE — Progress Notes (Addendum)
Physical Therapy Treatment Patient Details Name: [name] MRN: [id number] DOB: 04-Jun-1975 Today's Date: 02/21/2021   History of Present Illness 45 yo female presents to [facility] on 11/5 with aphasia. CTA shows occlusion of proximal M2 inferior division of L MCA with area of ischemia in posterior L MCA, possible areas of ischemia L cerebellum and R temporal lobe. 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 PMH includes HTN, CVA.    PT Comments    Pt received upright in chair and agreeable to therapy session with daughters Alden Benjamin and Anne Hahn present. Pt remains impulsive, beginning to stand or sit before cued. Pt initially nods head "yes" to all questions but will nod "no" when question is rephrased or specifics are asked, so unclear as to how much pt understands due to aphasia. Emphasized attention to R side with cues to turn head to R, pt able to occasionally follow these cues but tends to turn only to L when cued to turn or look to R side. Pt remains a good candidate for CIR to address her remaining deficits and return to prior level of function. Pt continues to benefit from PT services to progress toward functional mobility goals.      Recommendations for follow up therapy are one component of a multi-disciplinary discharge planning process, led by the attending physician.  Recommendations may be updated based on patient status, additional functional criteria and insurance authorization.  Follow Up Recommendations  Acute inpatient rehab (3hours/day)     Assistance Recommended at Discharge Frequent or constant Supervision/Assistance  Equipment Recommendations  None recommended by PT    Recommendations for Other Services       Precautions / Restrictions Precautions Precautions: Fall Precaution Comments: R inattention, eyes not crossing midline to R, aphasia,  SBP <180 Restrictions Weight Bearing Restrictions: No     Mobility  Bed Mobility  General bed mobility comments: Received in chair and in chair at end of session    Transfers Overall transfer level: Needs assistance Equipment used: 1 person hand held assist Transfers: Sit to/from Stand;Bed to chair/wheelchair/BSC Sit to Stand: Min guard (No physical assist given)     Step pivot transfers: Min assist     General transfer comment: pt is impulsive with standing, needs frequent cues to sit down and wait, but once prepared standing well with minA and no LOB with pivotal steps; then performed STS x 5 reps with single UE support from chair    Ambulation/Gait Ambulation/Gait assistance: Min guard;+2 safety/equipment (Chair follow) Gait Distance (Feet): 40 Feet (40 ft, seated break 65 ft) Assistive device: None (Therapist with hand out for HHA if needed, pt did not utilize) Gait Pattern/deviations: Step-through pattern;Decreased stride length;Wide base of support Gait velocity: decreased     General Gait Details: Pt stable with ambulation without HHA and up to minG physical assist. Pt was able to look towards R side when heavily cued, but not able to hold attention long or read any of the room numbers. Pt took a seated rest break and nodded "yes" when asked if dizzy, but later nodded "no" when asked if she was dizzy during ambulation. Unable to read room numbers, even when directly facing wall     Modified Rankin (Stroke Patients Only) Modified Rankin (Stroke Patients Only) Pre-Morbid Rankin Score: No symptoms Modified Rankin: Moderately severe disability     Balance Overall balance assessment:  Needs assistance Sitting-balance support: No upper extremity supported;Feet supported Sitting balance-Leahy Scale: Good     Standing balance support: During functional activity Standing balance-Leahy Scale: Fair Standing balance comment: Static without UE support    Cognition  Arousal/Alertness: Awake/alert Behavior During Therapy: Flat affect Overall Cognitive Status: Difficult to assess Area of Impairment: Following commands;Safety/judgement;Attention  Current Attention Level: Focused   Following Commands: Follows one step commands with increased time;Follows one step commands inconsistently Safety/Judgement: Decreased awareness of safety;Decreased awareness of deficits     General Comments: Pt impulsively standing up or sitting down when cued to lift feet up or to wait in chair, possibly some receptive aphasia in addition to expressive aphasia. pt has more difficulty following commands when therapist on her R side. At end of session pt looked at daughter on R side and smiled.        Exercises Other Exercises Other Exercises: STS x 5 reps    General Comments General comments (skin integrity, edema, etc.): Pt had difficulty washing hands at sink, needed frequent verbal and tactile cues to wash and dry hands, unclear if due to aphasia or visual deficits. VSS on RA; BP after ambulation 150/86 (106) HR 70 and SBP 138 after standing, no dizziness.      Pertinent Vitals/Pain Pain Assessment: No/denies pain Pain Location: pt shakes head yes initially when asked if she has pain, but upon rewording and asking what specific areas are hurting, she shakes her head no     PT Goals (current goals can now be found in the care plan section) Acute Rehab PT Goals Patient Stated Goal: unable to state PT Goal Formulation: With patient Time For Goal Achievement: 03/03/21 Progress towards PT goals: Progressing toward goals    Frequency    Min 4X/week      PT Plan Current plan remains appropriate       AM-PAC PT "6 Clicks" Mobility   Outcome Measure  Help needed turning from your back to your side while in a flat bed without using bedrails?: A Little Help needed moving from lying on your back to sitting on the side of a flat bed without using bedrails?: A  Little Help needed moving to and from a bed to a chair (including a wheelchair)?: A Lot (mod cues) Help needed standing up from a chair using your arms (e.g., wheelchair or bedside chair)?: A Lot (mod cues) Help needed to walk in hospital room?: A Lot (max cues) Help needed climbing 3-5 steps with a railing? : Total 6 Click Score: 13    End of Session Equipment Utilized During Treatment: Gait belt Activity Tolerance: Patient tolerated treatment well Patient left: in chair;with call bell/phone within reach;with chair alarm set;with family/visitor present (Daughters Mya and [place] present and sittign on R side) Nurse Communication: Mobility status PT Visit Diagnosis: Other abnormalities of gait and mobility (R26.89);Muscle weakness (generalized) (M62.81);Hemiplegia and hemiparesis Hemiplegia - Right/Left: Right Hemiplegia - dominant/non-dominant: Dominant Hemiplegia - caused by: Cerebral infarction     Time: 1250-1310 PT Time Calculation (min) (ACUTE ONLY): 20 min  Charges:  $Gait Training: 8-22 mins                     Harland German, Student PTA CI: Carly P., PTA  Carly M Poff 02/21/2021, 1:39 PM

## 2021-02-22 DIAGNOSIS — I63512 Cerebral infarction due to unspecified occlusion or stenosis of left middle cerebral artery: Secondary | ICD-10-CM | POA: Diagnosis not present

## 2021-02-22 LAB — COMPREHENSIVE METABOLIC PANEL
ALT: 15 U/L (ref 0–44)
AST: 15 U/L (ref 15–41)
Albumin: 2.7 g/dL — ABNORMAL LOW (ref 3.5–5.0)
Alkaline Phosphatase: 57 U/L (ref 38–126)
Anion gap: 8 (ref 5–15)
BUN: 14 mg/dL (ref 6–20)
CO2: 24 mmol/L (ref 22–32)
Calcium: 8.8 mg/dL — ABNORMAL LOW (ref 8.9–10.3)
Chloride: 102 mmol/L (ref 98–111)
Creatinine, Ser: 1.17 mg/dL — ABNORMAL HIGH (ref 0.44–1.00)
GFR, Estimated: 59 mL/min — ABNORMAL LOW (ref >60)
Glucose, Bld: 90 mg/dL (ref 70–99)
Potassium: 4.1 mmol/L (ref 3.5–5.1)
Sodium: 134 mmol/L — ABNORMAL LOW (ref 135–145)
Total Bilirubin: 0.5 mg/dL (ref 0.3–1.2)
Total Protein: 6.9 g/dL (ref 6.5–8.1)

## 2021-02-22 LAB — CBC WITH DIFFERENTIAL/PLATELET
Abs Immature Granulocytes: 0.01 10*3/uL (ref 0.00–0.07)
Basophils Absolute: 0 10*3/uL (ref 0.0–0.1)
Basophils Relative: 0 %
Eosinophils Absolute: 0 10*3/uL (ref 0.0–0.5)
Eosinophils Relative: 1 %
HCT: 28.6 % — ABNORMAL LOW (ref 36.0–46.0)
Hemoglobin: 8.2 g/dL — ABNORMAL LOW (ref 12.0–15.0)
Immature Granulocytes: 0 %
Lymphocytes Relative: 34 %
Lymphs Abs: 1.7 10*3/uL (ref 0.7–4.0)
MCH: 19.8 pg — ABNORMAL LOW (ref 26.0–34.0)
MCHC: 28.7 g/dL — ABNORMAL LOW (ref 30.0–36.0)
MCV: 68.9 fL — ABNORMAL LOW (ref 80.0–100.0)
Monocytes Absolute: 0.5 10*3/uL (ref 0.1–1.0)
Monocytes Relative: 10 %
Neutro Abs: 2.8 10*3/uL (ref 1.7–7.7)
Neutrophils Relative %: 55 %
Platelets: 331 10*3/uL (ref 150–400)
RBC: 4.15 MIL/uL (ref 3.87–5.11)
RDW: 19.5 % — ABNORMAL HIGH (ref 11.5–15.5)
WBC: 5.1 10*3/uL (ref 4.0–10.5)
nRBC: 0 % (ref 0.0–0.2)

## 2021-02-22 MED ORDER — SORBITOL 70 % SOLN
30.0000 mL | Freq: Once | Status: AC
  Administered 2021-02-22: 30 mL via ORAL
  Filled 2021-02-22: qty 30

## 2021-02-22 NOTE — Plan of Care (Signed)
  Problem: RH Swallowing Goal: LTG Patient will consume least restrictive diet using compensatory strategies with assistance (SLP) Description: LTG:  Patient will consume least restrictive diet using compensatory strategies with assistance (SLP) Flowsheets (Taken 02/22/2021 1706) LTG: Pt Patient will consume least restrictive diet using compensatory strategies with assistance of (SLP): Minimal Assistance - Patient > 75%   Problem: RH Comprehension Communication Goal: LTG Patient will comprehend basic/complex auditory (SLP) Description: LTG: Patient will comprehend basic/complex auditory information with cues (SLP). Flowsheets (Taken 02/22/2021 1706) LTG: Patient will comprehend auditory information with cueing (SLP): Moderate Assistance - Patient 50 - 74%   Problem: RH Expression Communication Goal: LTG Patient will express needs/wants via multi-modal(SLP) Description: LTG:  Patient will express needs/wants via multi-modal communication (gestures/written, etc) with cues (SLP) Flowsheets (Taken 02/22/2021 1706) LTG: Patient will express needs/wants via multimodal communication (gestures/written, etc) with cueing (SLP):  Moderate Assistance - Patient 50 - 74%  Maximal Assistance - Patient 25 - 49% Goal: LTG Patient will verbally express basic/complex needs(SLP) Description: LTG:  Patient will verbally express basic/complex needs, wants or ideas with cues  (SLP) Flowsheets (Taken 02/22/2021 1706) LTG: Patient will verbally express basic/complex needs, wants or ideas (SLP):  Maximal Assistance - Patient 25 - 49%  Total Assistance - Patient < 25%

## 2021-02-22 NOTE — Progress Notes (Signed)
PROGRESS NOTE   Subjective/Complaints: Per pt, c/o constipation- Visually showed me to the question that she felt constipated- nonverbal due to aphasia.       ROS: Limited by cognition/aphasia  Objective:   No results found. Recent Labs    02/22/21 0512  WBC 5.1  HGB 8.2*  HCT 28.6*  PLT 331   Recent Labs    02/20/21 0414 02/22/21 0512  NA 131* 134*  K 3.8 4.1  CL 99 102  CO2 24 24  GLUCOSE 105* 90  BUN 13 14  CREATININE 1.13* 1.17*  CALCIUM 9.1 8.8*    Intake/Output Summary (Last 24 hours) at 02/22/2021 1223 Last data filed at 02/22/2021 0700 Gross per 24 hour  Intake 290 ml  Output --  Net 290 ml        Physical Exam: Vital Signs Blood pressure (!) 150/75, pulse 66, temperature 98.5 F (36.9 C), temperature source Oral, resp. rate 16, height 5\' 5"  (1.651 m), weight 109.3 kg, SpO2 98 %.    General: awake, alert, appropriate, sitting EOB eating breakfast; NAD HENT: conjugate gaze; oropharynx moist CV: regular rate; no JVD Pulmonary: CTA B/L; no W/R/R- good air movement GI: soft, NT, ND, (+)BS Psychiatric: appropriate Neurological: aphasic; nonverbal   Musculoskeletal:        General: No swelling. Normal range of motion.     Cervical back: Normal range of motion.  Skin:    General: Skin is warm and dry.  Neurological:     Mental Status: She is alert.     Comments: Alert, makes eye contact. Expressive and receptive aphasia/apraxia.  Able to occasionally follow a simple command. Appears to have right HH and/or neglect. Right central 7 and tongue deviation. RUE ?2-3/5 with apraxia and inconsistent effort. RLE grossly 4/5. Decreased sense of pain and LT RUE and RLE. No abnl tone appreciated   Assessment/Plan: 1. Functional deficits which require 3+ hours per day of interdisciplinary therapy in a comprehensive inpatient rehab setting. Physiatrist is providing close team supervision and 24 hour  management of active medical problems listed below. Physiatrist and rehab team continue to assess barriers to discharge/monitor patient progress toward functional and medical goals  Care Tool:  Bathing              Bathing assist       Upper Body Dressing/Undressing Upper body dressing        Upper body assist      Lower Body Dressing/Undressing Lower body dressing            Lower body assist       Toileting Toileting    Toileting assist Assist for toileting: Contact Guard/Touching assist     Transfers Chair/bed transfer  Transfers assist           Locomotion Ambulation   Ambulation assist              Walk 10 feet activity   Assist           Walk 50 feet activity   Assist           Walk 150 feet activity   Assist  Walk 10 feet on uneven surface  activity   Assist           Wheelchair     Assist               Wheelchair 50 feet with 2 turns activity    Assist            Wheelchair 150 feet activity     Assist          Blood pressure (!) 150/75, pulse 66, temperature 98.5 F (36.9 C), temperature source Oral, resp. rate 16, height 5\' 5"  (1.651 m), weight 109.3 kg, SpO2 98 %.  Medical Problem List and Plan: 1.  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  Antithrombotics: -DVT/anticoagulation:  Pharmaceutical: Lovenox             -antiplatelet therapy: DAPT x3 months 3. Pain Management: N/A 4. Mood: LCSW to follow for evaluation and support             -antipsychotic agents: N/A 5. Neuropsych: This patient is not capable of making decisions on her own behalf. 6. Skin/Wound Care:  Routine pressure-relief measures. 7. Fluids/Electrolytes/Nutrition: Monitor intake/output.  Check  CMet in AM. 8.  HTN: Monitor blood pressures TID.              -- Continue metoprolol, lisinopril  11/12- BP's a little elevated 150/75- will con't regimen for now and monitor for trend 9:  Dyslipidemia: HDL- 32.  LDL 58-- > on Lipitor 80 mg 10.  Morbid obesity: BMI 41.  Educated on diet and exercise as well as weight loss to help promote overall health and mobility. 11.  Hypokalemia: Resolved with supplementation. 12.  Acute on chronic renal failure: BUN/serum 25/1.5 admission.    --Was 1.18  in 09/2020.  Monitor with serial checks.  11/12- Cr stable- con't regimen 13.  Iron deficiency anemia: Continue iron supplement.   14.  Insomnia: Continue melatonin as well as low-dose Seroquel at bedtime  11/12- slept well   15/ Constipation  11/12- LBM 11/8- will give sorbitol this afternoon after therapy.       LOS: 1 days A FACE TO FACE EVALUATION WAS PERFORMED  Shalisha Clausing 02/22/2021, 12:23 PM

## 2021-02-22 NOTE — Plan of Care (Signed)
  Problem: RH Balance Goal: LTG Patient will maintain dynamic standing with ADLs (OT) Description: LTG:  Patient will maintain dynamic standing balance with assist during activities of daily living (OT)  Flowsheets (Taken 02/22/2021 1244) LTG: Pt will maintain dynamic standing balance during ADLs with: Supervision/Verbal cueing   Problem: Sit to Stand Goal: LTG:  Patient will perform sit to stand in prep for activites of daily living with assistance level (OT) Description: LTG:  Patient will perform sit to stand in prep for activites of daily living with assistance level (OT) Flowsheets (Taken 02/22/2021 1244) LTG: PT will perform sit to stand in prep for activites of daily living with assistance level: Supervision/Verbal cueing   Problem: RH Eating Goal: LTG Patient will perform eating w/assist, cues/equip (OT) Description: LTG: Patient will perform eating with assist, with/without cues using equipment (OT) Flowsheets (Taken 02/22/2021 1244) LTG: Pt will perform eating with assistance level of: Supervision/Verbal cueing   Problem: RH Grooming Goal: LTG Patient will perform grooming w/assist,cues/equip (OT) Description: LTG: Patient will perform grooming with assist, with/without cues using equipment (OT) Flowsheets (Taken 02/22/2021 1244) LTG: Pt will perform grooming with assistance level of: Supervision/Verbal cueing   Problem: RH Bathing Goal: LTG Patient will bathe all body parts with assist levels (OT) Description: LTG: Patient will bathe all body parts with assist levels (OT) Flowsheets (Taken 02/22/2021 1244) LTG: Pt will perform bathing with assistance level/cueing: Supervision/Verbal cueing   Problem: RH Dressing Goal: LTG Patient will perform upper body dressing (OT) Description: LTG Patient will perform upper body dressing with assist, with/without cues (OT). Flowsheets (Taken 02/22/2021 1244) LTG: Pt will perform upper body dressing with assistance level of:  Supervision/Verbal cueing Goal: LTG Patient will perform lower body dressing w/assist (OT) Description: LTG: Patient will perform lower body dressing with assist, with/without cues in positioning using equipment (OT) Flowsheets (Taken 02/22/2021 1244) LTG: Pt will perform lower body dressing with assistance level of: Supervision/Verbal cueing   Problem: RH Toileting Goal: LTG Patient will perform toileting task (3/3 steps) with assistance level (OT) Description: LTG: Patient will perform toileting task (3/3 steps) with assistance level (OT)  Flowsheets (Taken 02/22/2021 1244) LTG: Pt will perform toileting task (3/3 steps) with assistance level: Supervision/Verbal cueing   Problem: RH Toilet Transfers Goal: LTG Patient will perform toilet transfers w/assist (OT) Description: LTG: Patient will perform toilet transfers with assist, with/without cues using equipment (OT) Flowsheets (Taken 02/22/2021 1244) LTG: Pt will perform toilet transfers with assistance level of: Supervision/Verbal cueing   Problem: RH Tub/Shower Transfers Goal: LTG Patient will perform tub/shower transfers w/assist (OT) Description: LTG: Patient will perform tub/shower transfers with assist, with/without cues using equipment (OT) Flowsheets (Taken 02/22/2021 1244) LTG: Pt will perform tub/shower stall transfers with assistance level of: Supervision/Verbal cueing

## 2021-02-22 NOTE — Evaluation (Signed)
Speech Language Pathology Assessment and Plan  Patient Details  Name: [name] MRN: [id number] Date of Birth: 08-May-1975  SLP Diagnosis: Aphasia;Cognitive Impairments;Dysphagia  Rehab Potential: Good ELOS: ~2 weeks   Today's Date: 02/22/2021 SLP Individual Time: 1100-1200 SLP Individual Time Calculation (min): 50 min  Hospital Problem: Principal Problem:   Acute ischemic left middle cerebral artery (MCA) stroke (HCC)  Past Medical History:  Past Medical History:  Diagnosis Date   Anemia    history of goiter    Hypertension    Low back pain    Stroke [facility])    Past Surgical History:  Past Surgical History:  Procedure Laterality Date   CESAREAN SECTION     IR CT HEAD LTD  02/16/2021   IR CT HEAD LTD  02/16/2021   IR CT HEAD LTD  02/16/2021   IR CT HEAD LTD  02/16/2021   IR PERCUTANEOUS ART THROMBECTOMY/INFUSION INTRACRANIAL INC DIAG ANGIO  02/16/2021   RADIOLOGY WITH ANESTHESIA N/A 02/16/2021   Procedure: IR WITH ANESTHESIA;  Surgeon: Luanne Bras, MD;  Location: [place];  Service: Radiology;  Laterality: N/A;   THYROIDECTOMY      Assessment / Plan / Recommendation Clinical Impression  [place] " [address] [name] is a 45 year old female with history of HTN, multiple prior CVA who was admitted via [facility] on 02/16/21 with right sided weakness and inability to speak. CTA/P  head was done showing perfusion mismatch w/ ischemia in left parietal region and M2 occlusion. MRI brain done showing acute large MCA and left PICA territory infarct with mild mass-effect, additional multiple small acute infarcts in bilateral cerebral hemispheres suggestive of embolic event and advanced chronic white matter disease. Dr. Leonie Man felt the stroke was embolic secondary to intracranial left MCA atherosclerosis and DAPT x3 months recommended.  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 Therapy ongoing and patient  continues to have patient scheduled right-sided weakness with aphasia and apraxia, left gaze deviation, visual deficits, sleep-wake disruption as well as question of dizziness with activity.   Pt presents with severe expressive > receptive aphasia, likely global aphasia though further language assessment recommended. Unable to assess reading or writing due to time constraints. Expressive deficits can be characterized by minimal verbal output complicated by impaired initiation, imitation, verbal repetition, automatic speech, and naming. Pt elicited occasional spontaneous responses (um, good, okay, sh*t), and verbally responded to yes/no questions during <25% of occasions. She was more likely to respond with head nods and gestures which were inconsistently effective. She was not stimulable to cueing hierarchy. Receptively, pt answered basic yes/no questions with 50% accuracy and complex questions with 25% accuracy. She was able to follow one-step commands with mod-to-max A verbal/visual cues which were complicated by suspected apraxia and right visual inattention. Pt appeared to understand simple conversation. Pt is currently consuming a dysphagia 3 diet/thin liquids and consuming pills crushed in puree. During session SLP requested nurse to trial pills whole with liquid in which pt consumed with effective AP transit and without overt s/sx of aspiration. However, she displayed difficulty sequencing task (putting medicine cup in mouth followed by taking a sip of liquid) therefore recommend taking whole pills in puree.   Pt's mother and 3 children were present and participative in education opportunities. SLP provided family with handouts on aphasia, dysphagia, communication strategies, and handout on left vs. right brain deficits. Family appreciative for education and handouts.   Patient  would benefit from skilled SLP intervention to maximize her cognitive, speech-language functioning and overall functional  independence prior to discharge. Anticipate pt will require 24 hour supervision and would benefit from eithr [facility] or OP services depending on transportation.   Skilled Therapeutic Interventions          Patient participated in speech-language, cognitive, and clinical swallow evaluations. Please see above.  SLP Assessment  Patient will need skilled Speech Lanaguage Pathology Services during CIR admission    Recommendations  SLP Diet Recommendations: Dysphagia 3 (Mech soft);Thin Liquid Administration via: Straw;Cup Medication Administration: Whole meds with puree Supervision: Full supervision/cueing for compensatory strategies;Staff to assist with self feeding;Trained caregiver to feed patient Compensations: Minimize environmental distractions;Slow rate;Small sips/bites Postural Changes and/or Swallow Maneuvers: Seated upright 90 degrees Oral Care Recommendations: Oral care BID Patient destination: Home Follow up Recommendations: 24 hour supervision/assistance;Home Health SLP;Outpatient SLP Equipment Recommended: None recommended by SLP    SLP Frequency 3 to 5 out of 7 days   SLP Duration  SLP Intensity  SLP Treatment/Interventions ~2 weeks  Minumum of 1-2 x/day, 30 to 90 minutes  Cognitive remediation/compensation;Cueing hierarchy;Dysphagia/aspiration precaution training;Functional tasks;Multimodal communication approach;Patient/family education;Speech/Language facilitation    Pain    Prior Functioning Cognitive/Linguistic Baseline: Information not available Type of Home: House  Lives With: Alone Available Help at Discharge: Available PRN/intermittently;Available 24 hours/day;Family Vocation: Unemployed  SLP Evaluation Cognition Overall Cognitive Status: Impaired/Different from baseline Arousal/Alertness: Awake/alert Orientation Level: Oriented to person Year:  (unable to assess) Month:  (unable to assess) Day of Week:  (unable to assess) Attention: Sustained Sustained  Attention: Impaired Memory:  (unable to assess) Immediate Memory Recall:  (unable to assess) Awareness: Impaired Awareness Impairment: Intellectual impairment Problem Solving: Impaired Problem Solving Impairment: Functional basic Safety/Judgment: Impaired Comments: safety impacted by apraxia, aphasia, right inattention  Comprehension Auditory Comprehension Overall Auditory Comprehension: Impaired Yes/No Questions: Impaired Basic Biographical Questions: 51-75% accurate Basic Immediate Environment Questions: 25-49% accurate Complex Questions: 0-24% accurate Commands: Impaired One Step Basic Commands: 0-24% accurate Conversation: Simple Interfering Components: Attention;Motor planning Visual Recognition/Discrimination Discrimination: Not tested Reading Comprehension Reading Status: Not tested Expression Expression Primary Mode of Expression: Nonverbal - gestures Verbal Expression Overall Verbal Expression: Impaired Initiation: Impaired Automatic Speech:  (unable) Level of Generative/Spontaneous Verbalization: Word Repetition: Impaired Level of Impairment: Word level Naming: Impairment Responsive: 0-25% accurate Confrontation: Not tested Convergent: Not tested Divergent: Not tested Pragmatics: Unable to assess Interfering Components: Attention Non-Verbal Means of Communication: Gestures Written Expression Written Expression: Not tested Oral Motor Oral Motor/Sensory Function Overall Oral Motor/Sensory Function:  (unable to assess) Motor Speech Overall Motor Speech: Other (comment) (difficult to assess due to limited verbal output)  Care Tool Care Tool Cognition Ability to hear (with hearing aid or hearing appliances if normally used Ability to hear (with hearing aid or hearing appliances if normally used): 1. Minimal difficulty - difficulty in some environments (e.g. when person speaks softly or setting is noisy)   Expression of Ideas and Wants Expression of Ideas and  Wants: 2. Frequent difficulty - frequently exhibits difficulty with expressing needs and ideas   Understanding Verbal and Non-Verbal Content Understanding Verbal and Non-Verbal Content: 2. Sometimes understands - understands only basic conversations or simple, direct phrases. Frequently requires cues to understand  Memory/Recall Ability Memory/Recall Ability : That he or she is in a hospital/hospital unit;Current season   Bedside Swallowing Assessment General Date of Onset: 02/16/21 Previous Swallow Assessment: 02/17/2021 Diet Prior to this Study: Dysphagia 3 (soft);Thin liquids Temperature Spikes Noted: No Respiratory Status: Room air  History of Recent Intubation: Yes Length of Intubations (days): 1 days Date extubated: 02/16/21 Behavior/Cognition: Alert;Cooperative Oral Cavity - Dentition: Adequate natural dentition Self-Feeding Abilities: Needs assist Vision: Impaired for self-feeding Patient Positioning: Upright in bed Baseline Vocal Quality: Not observed Volitional Cough: Cognitively unable to elicit Volitional Swallow: Able to elicit  Oral Care Assessment Does patient have any of the following "high(er) risk" factors?: None of the above Does patient have any of the following "at risk" factors?: None of the above Ice Chips Ice chips: Within functional limits Presentation: Spoon Thin Liquid Thin Liquid: Within functional limits Presentation: Straw Nectar Thick Nectar Thick Liquid: Not tested Honey Thick Honey Thick Liquid: Not tested Puree Puree: Within functional limits Presentation: Spoon Solid Solid: Not tested BSE Assessment Risk for Aspiration Impact on safety and function: Mild aspiration risk Other Related Risk Factors: Previous CVA;Cognitive impairment  Short Term Goals: Week 1: SLP Short Term Goal 1 (Week 1): Patient will express wants/needs via multimodal communication with max A verbal/visual cues SLP Short Term Goal 2 (Week 1): Patient will indicate  preferences (yes/no, choice of 2) via pointing/gestures/head nods during 50% of occasions with max A multimodal cues SLP Short Term Goal 3 (Week 1): Patient will identify objects in field of 3 with 50% accuracy with max A verbal/visual cues SLP Short Term Goal 4 (Week 1): Pt will produce meaningful vocalizations during structured speech/language tasks with max A multimodal cues SLP Short Term Goal 5 (Week 1): Patient will consume current diet without overt s/sx of aspiration and min-to-mod A verbal cues for swallowing safety  Refer to Care Plan for Long Term Goals  Recommendations for other services: None   Discharge Criteria: Patient will be discharged from SLP if patient refuses treatment 3 consecutive times without medical reason, if treatment goals not met, if there is a change in medical status, if patient makes no progress towards goals or if patient is discharged from hospital.  The above assessment, treatment plan, treatment alternatives and goals were discussed and mutually agreed upon: by patient and by family  Patty Sermons 02/22/2021, 5:06 PM

## 2021-02-24 DIAGNOSIS — I69320 Aphasia following cerebral infarction: Secondary | ICD-10-CM

## 2021-02-24 DIAGNOSIS — I63512 Cerebral infarction due to unspecified occlusion or stenosis of left middle cerebral artery: Secondary | ICD-10-CM | POA: Diagnosis not present

## 2021-02-24 DIAGNOSIS — I69351 Hemiplegia and hemiparesis following cerebral infarction affecting right dominant side: Principal | ICD-10-CM

## 2021-02-24 DIAGNOSIS — I69352 Hemiplegia and hemiparesis following cerebral infarction affecting left dominant side: Secondary | ICD-10-CM

## 2021-02-24 LAB — CBC
HCT: 27.9 % — ABNORMAL LOW (ref 36.0–46.0)
Hemoglobin: 8 g/dL — ABNORMAL LOW (ref 12.0–15.0)
MCH: 20.4 pg — ABNORMAL LOW (ref 26.0–34.0)
MCHC: 28.7 g/dL — ABNORMAL LOW (ref 30.0–36.0)
MCV: 71.2 fL — ABNORMAL LOW (ref 80.0–100.0)
Platelets: 349 10*3/uL (ref 150–400)
RBC: 3.92 MIL/uL (ref 3.87–5.11)
RDW: 20.3 % — ABNORMAL HIGH (ref 11.5–15.5)
WBC: 5.2 10*3/uL (ref 4.0–10.5)
nRBC: 0 % (ref 0.0–0.2)

## 2021-02-24 LAB — BASIC METABOLIC PANEL
Anion gap: 7 (ref 5–15)
BUN: 13 mg/dL (ref 6–20)
CO2: 25 mmol/L (ref 22–32)
Calcium: 8.8 mg/dL — ABNORMAL LOW (ref 8.9–10.3)
Chloride: 103 mmol/L (ref 98–111)
Creatinine, Ser: 1.36 mg/dL — ABNORMAL HIGH (ref 0.44–1.00)
GFR, Estimated: 49 mL/min — ABNORMAL LOW (ref >60)
Glucose, Bld: 100 mg/dL — ABNORMAL HIGH (ref 70–99)
Potassium: 3.8 mmol/L (ref 3.5–5.1)
Sodium: 135 mmol/L (ref 135–145)

## 2021-02-24 MED ORDER — EXERCISE FOR HEART AND HEALTH BOOK
Freq: Once | Status: AC
  Filled 2021-02-24: qty 1

## 2021-02-24 MED ORDER — BLOOD PRESSURE CONTROL BOOK
Freq: Once | Status: AC
  Filled 2021-02-24: qty 1

## 2021-02-24 NOTE — Progress Notes (Signed)
Physical Therapy Session Note  Patient Details  Name: [name] MRN: [id number] Date of Birth: [birth year]  Today's Date: 02/24/2021 PT Individual Time: 1000-1056; 1530-1555 PT Individual Time Calculation (min): 56 min and 25 mins  Short Term Goals: Week 1:     Skilled Therapeutic Interventions/Progress Updates:  Session 1: Patient received reclined in bed, agreeable to PT. She was unable to indicate whether she was in pain or not due to aphasia. She had no antalgic movement, but would sometimes respond yes and sometimes no to whether she had a headache or not. RN aware. She was able to ambulate to 4th floor therapy gym with CGA for safety with no AD. Patient unable to follow verbal cues or gestures for directions. When in the elevator and the doors opened, instead of walking out the doors, she turned her back to the doors. She required Max multimodal cuing to exit elevator. Patient able to complete dynamic standing balance task of folding towels with initial demonstration and consistent verbal cues to continue task and place towels on table. No LOB noted. Patient able to carry stack of towels to apartment with CGA, max multimodal cues for pathfinding. Upon returning to gym, patient completing cog tasks with writing her name. She required hand over hand assist to write her name "[name]." Patient initially grabbing pen with L UE, but appears to be R handed. R neglect/inattention apparent. PT drawing a happy, sad and angry face and patient able to correctly identify emotion when label was written 50% of the time. Patient unable to copy her name even with frequent repetition of PT writing her name and providing the same verbal cuing to complete task. Patient often turning her head R and appearing to try to see using L eye primarily, however, when PT would look at patient, patient appearing unable to focus on PT with L eye. Patient noted to be increasingly frustrated at her difficulty  completing tasks, often shaking her R hand and grunting. Patient ambulating back to her room with no AD, CGA and max multimodal cues for pathfinding. Patient returning to bed, bed alarm on, call light within reach. Per NT, patient was able to make her way to the elevator and push the "down" button before staff were able to redirect her to her room. Patient may benefit from move to 4W unit to prevent elopement- discussed with team.   Session 2: Patient received sitting edge of bed with family present, agreeable to PT. She denies pain when asked. Patient ambulating to dayroom with no AD and CGA. R inattention noted with patient nearly walking into door frames and obstacles on the R. Patient completed 3x2 min bouts on NuStep level 5 with B LE. Between bouts, patient completing identifying task for color, shapes, and numbers. Patient accurate with yes/no 8/10 times with identifying color , 2/10 with shape, 4/10 with number. Patient able to scan right to Pts voice. She would close her L eye when looking at single objects as though she were seeing double, but was unable to confirm or deny whether she was truly seeing double or not. Patient much more vocal this afternoon. Ambulating back to her room with CGA. Patient able to scan R to "find her family" in her room. Patient remaining sitting edge of bed, family present, RN aware of patients location.   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

## 2021-02-24 NOTE — Evaluation (Addendum)
Physical Therapy Assessment and Plan  Patient Details  Name: [name] MRN: [id number] Date of Birth: July 10, 1975  PT Diagnosis: Difficulty walking, Hemiparesis dominant, Impaired cognition, and Muscle weakness Rehab Potential: Good ELOS: 14 days   Today's Date: 02/22/2021 PT Individual Time:  8366-2947  PT Individual Time Calculation (min): 72 min     Hospital Problem: Principal Problem:   Acute ischemic left middle cerebral artery (MCA) stroke ([place])   Past Medical History:  Past Medical History:  Diagnosis Date   Anemia    history of goiter    Hypertension    Low back pain    Stroke [facility])    Past Surgical History:  Past Surgical History:  Procedure Laterality Date   CESAREAN SECTION     IR CT HEAD LTD  02/16/2021   IR CT HEAD LTD  02/16/2021   IR CT HEAD LTD  02/16/2021   IR CT HEAD LTD  02/16/2021   IR PERCUTANEOUS ART THROMBECTOMY/INFUSION INTRACRANIAL INC DIAG ANGIO  02/16/2021   RADIOLOGY WITH ANESTHESIA N/A 02/16/2021   Procedure: IR WITH ANESTHESIA;  Surgeon: Luanne Bras, MD;  Location: [place];  Service: Radiology;  Laterality: N/A;   THYROIDECTOMY      Assessment & Plan Clinical Impression: Patient is a 45 y.o. female with history of HTN, multiple prior CVA who was admitted via [facility] on 02/16/21 with right sided weakness and inability to speak. CTA/P  head was done showing perfusion mismatch w/ ischemia in left parietal region and M2 occlusion. She was transferred to [facility] and underwent cerebral angio with thrombectomy of Left M2 reoccusion and 2B revascularization only.  MRI brain done showing acute large MCA and left PICA territory infarct with mild mass-effect, additional multiple small acute infarcts in bilateral cerebral hemispheres suggestive of embolic event and advanced chronic white matter disease.  2D echo done showing EF of 60 to 65% with no wall abnormality and severely dilated LA and mild dilatation of ascending aorta -37 mm.  Dr. Leonie Man felt the  stroke was embolic secondary to intracranial left MCA atherosclerosis and DAPT x3 months recommended. She has had issues with hypokalemia requiring runs of K.   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  Therapy ongoing and patient continues to have patient scheduled right-sided weakness with aphasia and apraxia, left gaze deviation, visual deficits, sleep-wake disruption as well as question of dizziness with activity.  CIR recommended due to functional decline.  Patient transferred to CIR on 02/21/2021 .   Patient currently requires  CGA  with mobility secondary to muscle weakness, decreased cardiorespiratoy endurance, motor apraxia, decreased coordination, and decreased motor planning, decreased visual perceptual skills, field cut, and hemianopsia, right side neglect and ideational apraxia, decreased initiation, decreased attention, decreased awareness, decreased problem solving, and decreased safety awareness, and decreased standing balance, decreased balance strategies, and mild R hemipareisis .  Prior to hospitalization, patient was independent  with mobility and lived with Alone (per chart) in a House home.  Home access is 2Stairs to enter.  Patient will benefit from skilled PT intervention to maximize safe functional mobility, minimize fall risk, and decrease caregiver burden for planned discharge home with 24 hour supervision.  Anticipate patient will benefit from follow up OP at discharge.  PT - End of Session Activity Tolerance: Tolerates 30+ min activity with multiple rests Endurance Deficit: Yes Endurance Deficit Description: multiple rest breaks throughout session especially after longer amb bouts PT  Assessment Rehab Potential (ACUTE/IP ONLY): Good PT Barriers to Discharge: Inaccessible home environment;Decreased caregiver support;Home environment access/layout;Wound Care;Lack of/limited family support;Insurance  for SNF coverage;Weight;Medication compliance;Nutrition means PT Plan PT Intensity: Minimum of 1-2 x/day ,45 to 90 minutes PT Frequency: 5 out of 7 days PT Duration Estimated Length of Stay: 14 days PT Treatment/Interventions: Ambulation/gait training;Community reintegration;DME/adaptive equipment instruction;Neuromuscular re-education;Psychosocial support;Stair training;UE/LE Strength taining/ROM;UE/LE Coordination activities;Therapeutic Activities;Skin care/wound management;Pain management;Functional electrical stimulation;Discharge planning;Balance/vestibular training;Wheelchair propulsion/positioning;Visual/perceptual remediation/compensation;Splinting/orthotics;Therapeutic Exercise;Patient/family education;Functional mobility training;Disease management/prevention;Cognitive remediation/compensation PT Recommendation Follow Up Recommendations: Home health PT;Outpatient PT Patient destination: Home Equipment Recommended: To be determined   PT Evaluation Precautions/Restrictions Precautions Precautions: Fall Precaution Comments: R inattention/ neglect, 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 Per notes, pt has 3 children ages 54, 1, and 7 y/o. Children have been living with grandmother. Pt's oldest daughter states she can provide needed assistance at time of pts d/c  Lives With: Alone (per chart) Prior Function Level of Independence: Independent with  basic ADLs;Independent with homemaking with ambulation;Independent with transfers;Independent with gait (per chart)  Able to Take Stairs?: Yes Vocation: Unemployed Vision/Perception  Vision - History Ability to See in Adequate Light:  (unknown at time of eval d/t global aphasia) Vision - Assessment Eye Alignment: Impaired (comment) Ocular Range of Motion: Restricted on the right Alignment/Gaze Preference: Gaze left Tracking/Visual Pursuits: Right eye does not track medially;Left eye does not track medially;Right eye does not track laterally Additional Comments: Pt compensating with head turns to locate anything to the R of midline, can only see 25% of L visual field, very poor ability to scan (follow instructions) and cannot locate family members standing directly in front of her Perception Perception: Impaired Inattention/Neglect: Does not attend to right visual field;Does not attend to right side of body Praxis Praxis: Impaired  Cognition Overall Cognitive Status: Impaired/Different from baseline Arousal/Alertness: Awake/alert Year:  (unable to assess) Month:  (unable to assess) Day of Week:  (unable to assess) Attention: Sustained Sustained Attention: Impaired Memory:  (unable to assess) Immediate Memory Recall:  (unable to assess) Awareness: Impaired Awareness Impairment: Intellectual impairment Problem Solving: Impaired Problem Solving Impairment: Functional basic Safety/Judgment: Impaired Comments: safety impacted by apraxia, aphasia, right vision cut and neglect Sensation Sensation Light Touch: Not tested (unable to fully assess due to global aphasia and inability to follow instructions) Coordination Gross Motor Movements are Fluid and Coordinated: Yes Fine Motor Movements are Fluid and Coordinated: No Coordination and Movement Description: Mild R hemiparesis Finger Nose Finger Test: Pt unable to follow instruction when given multimodal cues for test technique Heel  Shin Test: Not tested as pt unable to follow instructions d/t global aphasia Motor  Motor Motor: Abnormal postural alignment and control;Motor apraxia;Other (comment) (mild hemipareisis) Motor - Skilled Clinical Observations: mild R hemipareisis noted, severe motor apraxia   Trunk/Postural Assessment  Cervical Assessment Cervical Assessment: Exceptions to [facility] (forward head) Thoracic Assessment Thoracic Assessment: Exceptions to [facility] (rounded shoulders) Lumbar Assessment Lumbar Assessment: Exceptions to [facility] (posterior pelvic tilt) Postural Control Postural Control: Within Functional Limits  Balance Balance Balance Assessed: Yes Standardized Balance Assessment Standardized Balance  Assessment: Berg Balance Test Berg Balance Test Sit to Stand: Able to stand  independently using hands Standing Unsupported: Able to stand 2 minutes with supervision Sitting with Back Unsupported but Feet Supported on Floor or Stool: Able to sit safely and securely 2 minutes Stand to Sit: Controls descent by using hands Transfers: Able to transfer with verbal cueing and /or supervision Standing Unsupported with Eyes Closed: Able to stand 10 seconds with supervision Turn 360 Degrees: Needs close supervision or verbal cueing Standing Unsupported, Alternately Place Feet on Step/Stool: Able to complete 4 steps without aid or supervision Standing Unsupported, One Foot in Front: Able to take small step independently and hold 30 seconds Dynamic Sitting Balance Dynamic Sitting - Balance Support: During functional activity Dynamic Sitting - Level of Assistance: 5: Stand by assistance Dynamic Standing Balance Dynamic Standing - Balance Support: During functional activity Dynamic Standing - Level of Assistance: 4: Min assist Dynamic Standing - Balance Activities: Lateral lean/weight shifting;Forward lean/weight shifting (shower transfer) Extremity Assessment      RLE Assessment RLE Assessment: Within Functional  Limits LLE Assessment LLE Assessment: Within Functional Limits  Strength impairments noted functionally  Care Tool Care Tool Bed Mobility Roll left and right activity   Roll left and right assist level: Supervision/Verbal cueing    Sit to lying activity   Sit to lying assist level: Supervision/Verbal cueing    Lying to sitting on side of bed activity   Lying to sitting on side of bed assist level: the ability to move from lying on the back to sitting on the side of the bed with no back support.: Supervision/Verbal cueing     Care Tool Transfers Sit to stand transfer   Sit to stand assist level: Contact Guard/Touching assist    Chair/bed transfer   Chair/bed transfer assist level: Contact Guard/Touching assist     Toilet transfer   Assist Level: Contact Guard/Touching assist    Car transfer   Car transfer assist level: Contact Guard/Touching assist;Minimal Assistance - Patient > 75%      Care Tool Locomotion Ambulation   Assist level: Contact Guard/Touching assist Assistive device: No Device Max distance: 200  Walk 10 feet activity   Assist level: Contact Guard/Touching assist Assistive device: No Device   Walk 50 feet with 2 turns activity   Assist level: Contact Guard/Touching assist Assistive device: No Device  Walk 150 feet activity   Assist level: Contact Guard/Touching assist Assistive device: No Device  Walk 10 feet on uneven surfaces activity Walk 10 feet on uneven surfaces activity did not occur: Safety/medical concerns      Stairs   Assist level: Minimal Assistance - Patient > 75% Stairs assistive device: 2 hand rails    Walk up/down 1 step activity   Walk up/down 1 step (curb) assist level: Contact Guard/Touching assist Walk up/down 1 step or curb assistive device: 2 hand rails  Walk up/down 4 steps activity   Walk up/down 4 steps assist level: Minimal Assistance - Patient > 75% Walk up/down 4 steps assistive device: 2 hand rails  Walk up/down 12  steps activity Walk up/down 12 steps activity did not occur: Safety/medical concerns      Pick up small objects from floor   Pick up small object from the floor assist level: Dependent - Patient 0% (unable to follow instructions 2/2/ global aphasia)    Wheelchair Is the patient using a wheelchair?: No Type of Wheelchair: Manual Wheelchair activity did not occur: N/A      Wheel  50 feet with 2 turns activity Wheelchair 50 feet with 2 turns activity did not occur: N/A Assist Level: Total Assistance - Patient < 25%  Wheel 150 feet activity   Assist Level: Total Assistance - Patient < 25%    Refer to Care Plan for Long Term Goals  SHORT TERM GOAL WEEK 1 PT Short Term Goal 1 (Week 1): Pt will perform all functional standing transfers with improved safety awareness and supervision/ CGA. PT Short Term Goal 2 (Week 1): Pt will demo improved functional strength with improved RLE foot clearance through 100% of amb bouts. PT Short Term Goal 3 (Week 1): Pt will demonstrate improved scanning of visual field and improve obstacle clearance to R side by 50%.  Recommendations for other services: None   Skilled Therapeutic Intervention Mobility Bed Mobility Bed Mobility: Rolling Left;Rolling Right;Supine to Sit;Sit to Supine Rolling Right: Contact Guard/Touching assist;Supervision/verbal cueing Rolling Left: Contact Guard/Touching assist;Supervision/Verbal cueing Supine to Sit: Contact Guard/Touching assist;Minimal Assistance - Patient > 75% Sit to Supine: Supervision/Verbal cueing Transfers Transfers: Sit to Stand;Stand to Sit;Stand Pivot Transfers Sit to Stand: Contact Guard/Touching assist Stand to Sit: Contact Guard/Touching assist Stand Pivot Transfers: Contact Guard/Touching assist;Minimal Assistance - Patient > 75% Stand Pivot Transfer Details: Tactile cues for placement;Tactile cues for initiation;Verbal cues for technique;Verbal cues for precautions/safety Locomotion  Gait Ambulation:  Yes Gait Assistance: Contact Guard/Touching assist Assistive device: None Gait Gait: Yes Gait Pattern: Impaired Gait Pattern: Decreased step length - right;Decreased stance time - right;Decreased hip/knee flexion - right Gait velocity: decreased Stairs / Additional Locomotion Stairs: Yes Stairs Assistance: Contact Guard/Touching assist Stair Management Technique: Two rails Height of Stairs: 6 Wheelchair Mobility Wheelchair Mobility: No  PT Evaluation completed; see above for results. PT educated patient in roles of PT vs OT, PT POC, rehab potential, rehab goals, and discharge recommendations along with recommendation for follow-up rehabilitation services. Individual treatment initiated:  Patient seated upright in w/c upon PT arrival. Dtr in room providing supervision. Patient alert and agreeable to PT session. No pain complaint during session.  Therapeutic Activity: Bed Mobility: Patient performed supine to/from sit with supervision. Transfers: Patient performed sit <> stand, stand pivot, toilet transfers with CGA. Provided vc/tc for safety, positioning, initiation.  Gait Training:  Patient ambulated 200-300 feet using no AD with overall CGA with intermittent Min A for missteps/ balance. Provided vc/tc throughout for pace, R foot clearance, visual field scanning.  Neuromuscular Re-ed: NMR facilitated during session with focus on standing balance. Pt guided in berg Balance test, however pt's global aphasia limited her ability to perform requested tasks despte multimodal cues. NMR performed for improvements in motor control and coordination, balance, sequencing, judgement, and self confidence/ efficacy in performing all aspects of mobility at highest level of independence.   Patient seated on EOB at end of session with brakes locked, bed alarm set, and all needs within reach.   Dtr educated on need for supervision and use of bed/ seat alarms until pt deemed to be safe in her room. For  now her safety awareness is impaired d/t change in cognition/ overall awareness of impairments. Will require consistent supervision.    Discharge Criteria: Patient will be discharged from PT if patient refuses treatment 3 consecutive times without medical reason, if treatment goals not met, if there is a change in medical status, if patient makes no progress towards goals or if patient is discharged from hospital.  The above assessment, treatment plan, treatment alternatives and goals were discussed and mutually agreed upon: by  patient and by family  Alger Simons 02/22/2021, 5:29 PM

## 2021-02-24 NOTE — Progress Notes (Signed)
Inpatient Rehabilitation Center Individual Statement of Services  Patient Name:  [name]  Date:  02/24/2021  Welcome to the Inpatient Rehabilitation Center.  Our goal is to provide you with an individualized program based on your diagnosis and situation, designed to meet your specific needs.  With this comprehensive rehabilitation program, you will be expected to participate in at least 3 hours of rehabilitation therapies Monday-Friday, with modified therapy programming on the weekends.  Your rehabilitation program will include the following services:  Physical Therapy (PT), Occupational Therapy (OT), Speech Therapy (ST), 24 hour per day rehabilitation nursing, Therapeutic Recreaction (TR), Neuropsychology, Care Coordinator, Rehabilitation Medicine, Nutrition Services, and Pharmacy Services  Weekly team conferences will be held on Wednesday to discuss your progress.  Your Inpatient Rehabilitation Care Coordinator will talk with you frequently to get your input and to update you on team discussions.  Team conferences with you and your family in attendance may also be held.  Expected length of stay: 14 days  Overall anticipated outcome: supervision with cues  Depending on your progress and recovery, your program may change. Your Inpatient Rehabilitation Care Coordinator will coordinate services and will keep you informed of any changes. Your Inpatient Rehabilitation Care Coordinator's name and contact numbers are listed  below.  The following services may also be recommended but are not provided by the Inpatient Rehabilitation Center:  Driving Evaluations Home Health Rehabiltiation Services Outpatient Rehabilitation Services Vocational Rehabilitation   Arrangements will be made to provide these services after discharge if needed.  Arrangements include referral to agencies that provide these services.  Your insurance has been verified to be:  medicaid Your primary doctor is:   Glenice Laine [employer]  Pertinent information will be shared with your doctor and your insurance company.  Inpatient Rehabilitation Care Coordinator:  Lavera Guise, [place] [phone] or [phone]  Information discussed with and copy given to patient by: Lucy Chris, 02/24/2021, 1:35 PM

## 2021-02-24 NOTE — Plan of Care (Signed)
  Problem: RH Balance Goal: LTG Patient will maintain dynamic standing balance (PT) Description: LTG:  Patient will maintain dynamic standing balance with assistance during mobility activities (PT) Flowsheets (Taken 02/22/2021 1755) LTG: Pt will maintain dynamic standing balance during mobility activities with:: Supervision/Verbal cueing   Problem: Sit to Stand Goal: LTG:  Patient will perform sit to stand with assistance level (PT) Description: LTG:  Patient will perform sit to stand with assistance level (PT) Flowsheets (Taken 02/22/2021 1755) LTG: PT will perform sit to stand in preparation for functional mobility with assistance level: Independent   Problem: RH Bed Mobility Goal: LTG Patient will perform bed mobility with assist (PT) Description: LTG: Patient will perform bed mobility with assistance, with/without cues (PT). Flowsheets (Taken 02/22/2021 1755) LTG: Pt will perform bed mobility with assistance level of: Independent   Problem: RH Bed to Chair Transfers Goal: LTG Patient will perform bed/chair transfers w/assist (PT) Description: LTG: Patient will perform bed to chair transfers with assistance (PT). Flowsheets (Taken 02/22/2021 1755) LTG: Pt will perform Bed to Chair Transfers with assistance level: Supervision/Verbal cueing   Problem: RH Car Transfers Goal: LTG Patient will perform car transfers with assist (PT) Description: LTG: Patient will perform car transfers with assistance (PT). Flowsheets (Taken 02/22/2021 1755) LTG: Pt will perform car transfers with assist:: Supervision/Verbal cueing   Problem: RH Furniture Transfers Goal: LTG Patient will perform furniture transfers w/assist (OT/PT) Description: LTG: Patient will perform furniture transfers  with assistance (OT/PT). Flowsheets (Taken 02/22/2021 0549) LTG: Pt will perform furniture transfers with assist:: Supervision/Verbal cueing   Problem: RH Ambulation Goal: LTG Patient will ambulate in controlled  environment (PT) Description: LTG: Patient will ambulate in a controlled environment, # of feet with assistance (PT). Flowsheets (Taken 02/22/2021 1755) LTG: Pt will ambulate in controlled environ  assist needed:: Supervision/Verbal cueing LTG: Ambulation distance in controlled environment: >300 feet with no AD Goal: LTG Patient will ambulate in home environment (PT) Description: LTG: Patient will ambulate in home environment, # of feet with assistance (PT). Flowsheets (Taken 02/22/2021 1755) LTG: Pt will ambulate in home environ  assist needed:: Supervision/Verbal cueing LTG: Ambulation distance in home environment: at least 50 ft with no AD   Problem: RH Stairs Goal: LTG Patient will ambulate up and down stairs w/assist (PT) Description: LTG: Patient will ambulate up and down # of stairs with assistance (PT) Flowsheets (Taken 02/22/2021 1755) LTG: Pt will ambulate up/down stairs assist needed:: Supervision/Verbal cueing LTG: Pt will  ambulate up and down number of stairs: at least 2 with no AD and 8 steps with HR setup as per home environment

## 2021-02-24 NOTE — Progress Notes (Addendum)
Patient ID: [name], female   DOB: [birth year], 45 y.o.   MRN: [id number] Met with the patient to introduce self and role. Aphasia and word finding deficits limited conversation however patient appears to understand information reviewed however had difficulty expressing questions about material outside of yes/no questions.Reviewed medical status, medications and plan of care. Reviewed secondary risk management including meds and dietary modification for HTN, HLD (Trig 325) and prediabetes. Patient given several handbooks and written information on diet tips and increasing protein and magnesium. Continue to follow along to discharge to address educational needs and facilitate preparation for discharge. Margarito Liner

## 2021-02-24 NOTE — Progress Notes (Signed)
Inpatient Rehabilitation  Patient information reviewed and entered into eRehab system by Dayten Juba Ezeriah Luty, OTR/L.   Information including medical coding, functional ability and quality indicators will be reviewed and updated through discharge.    

## 2021-02-24 NOTE — IPOC Note (Signed)
Overall Plan of Care [facility]) Patient Details Name: [name] MRN: [id number] DOB: 09/23/1975  Admitting Diagnosis: Acute ischemic left middle cerebral artery (MCA) stroke [facility])  Hospital Problems: Principal Problem:   Acute ischemic left middle cerebral artery (MCA) stroke (HCC)     Functional Problem List: Nursing Endurance, Medication Management, Perception, Safety, Skin Integrity  PT Balance, Behavior, Endurance, Motor, Perception, Safety, Skin Integrity  OT Balance, Behavior, Cognition, Vision, Endurance, Motor, Safety  SLP Motor, Cognition, Linguistic, Safety, Perception  TR         Basic ADL's: OT Eating, Grooming, Bathing, Dressing, Toileting     Advanced  ADL's: OT Simple Meal Preparation     Transfers: PT Bed to Chair, Car, Furniture, Floor  OT Toilet, Tub/Shower     Locomotion: PT Ambulation, Stairs     Additional Impairments: OT Fuctional Use of Upper Extremity  SLP Swallowing, Communication, Social Cognition expression, comprehension Problem Solving, Attention, Awareness  TR      Anticipated Outcomes Item Anticipated Outcome  Self Feeding Supervision  Swallowing  min A   Basic self-care  Supervision  Engineer, technical sales Transfers Supervision  Bowel/Bladder  n/a  Transfers  supervision  Locomotion  supervision  Communication  mod-to-max A  Cognition  mod-to-max A  Pain  n/a  Safety/Judgment  sueprvision and no falls   Therapy Plan: PT Intensity: Minimum of 1-2 x/day ,45 to 90 minutes PT Frequency: 5 out of 7 days PT Duration Estimated Length of Stay: 14 days OT Intensity: Minimum of 1-2 x/day, 45 to 90 minutes OT Frequency: 5 out of 7 days OT Duration/Estimated Length of Stay: 10-12 days SLP Intensity: Minumum of 1-2 x/day, 30 to 90 minutes SLP Frequency: 3 to 5 out of 7 days SLP Duration/Estimated Length of Stay: ~2 weeks   Due to the current state of emergency, patients may not be receiving their  3-hours of Medicare-mandated therapy.   Team Interventions: Nursing Interventions Patient/Family Education, Disease Management/Prevention, Medication Management, Skin Care/Wound Management, Discharge Planning  PT interventions Ambulation/gait training, Community reintegration, DME/adaptive equipment instruction, Neuromuscular re-education, Psychosocial support, Stair training, UE/LE Strength taining/ROM, UE/LE Coordination activities, Therapeutic Activities, Skin care/wound management, Pain management, Functional electrical stimulation, Discharge planning, Balance/vestibular training, Wheelchair propulsion/positioning, Visual/perceptual remediation/compensation, Splinting/orthotics, Therapeutic Exercise, Patient/family education, Functional mobility training, Disease management/prevention, Cognitive remediation/compensation  OT Interventions Balance/vestibular training, Disease mangement/prevention, Neuromuscular re-education, Self Care/advanced ADL retraining, Therapeutic Exercise, Wheelchair propulsion/positioning, UE/LE Strength taining/ROM, Pain management, DME/adaptive equipment instruction, Cognitive remediation/compensation, Community reintegration, Functional electrical stimulation, Patient/family education, UE/LE Coordination activities, Visual/perceptual remediation/compensation, Therapeutic Activities, Psychosocial support, Functional mobility training, Discharge planning  SLP Interventions Cognitive remediation/compensation, Cueing hierarchy, Dysphagia/aspiration precaution training, Functional tasks, Multimodal communication approach, Patient/family education, Speech/Language facilitation  TR Interventions    SW/CM Interventions     Barriers to Discharge MD  Medical stability  Nursing Decreased caregiver support, Home environment access/layout, Wound Care, Weight, Medication compliance Lives in 1 level home with 2 steps to enter and no rails. Lives with 3 teenage children. Daughter and  mother can provide min assist at discharge.  PT Inaccessible home environment, Decreased caregiver support, Home environment access/layout, Wound Care, Lack of/limited family support, Insurance for SNF coverage, Weight, Medication compliance, Nutrition means    OT Decreased caregiver support, Behavior Pt will need 24/7 support, ?family can provide this, pt also impulsive with functional movement  SLP      SW       Team Discharge Planning: Destination: PT-Home ,OT- Home , SLP-Home Projected Follow-up: PT-Home  health PT, Outpatient PT, OT-  Home health OT, SLP-24 hour supervision/assistance, Home Health SLP, Outpatient SLP Projected Equipment Needs: PT-To be determined, OT- To be determined, SLP-None recommended by SLP Equipment Details: PT- , OT-  Patient/family involved in discharge planning: PT- Patient, Family member/caregiver,  OT-Patient unable/family or caregiver not available, SLP-Patient, Family member/caregiver  MD ELOS: 16-18d Medical Rehab Prognosis:  Good Assessment:  [name] is a 45 year old female with history of HTN, multiple prior CVA who was admitted via [facility] on 02/16/21 with right sided weakness and inability to speak. CTA/P  head was done showing perfusion mismatch w/ ischemia in left parietal region and M2 occlusion. She was transferred to [facility] and underwent cerebral angio with thrombectomy of Left M2 reoccusion and 2B revascularization only.  MRI brain done showing acute large MCA and left PICA territory infarct with mild mass-effect, additional multiple small acute infarcts in bilateral cerebral hemispheres suggestive of embolic event and advanced chronic white matter disease.  2D echo done showing EF of 60 to 65% with no wall abnormality and severely dilated LA and mild dilatation of ascending aorta -37 mm.  Dr. Pearlean Brownie felt the stroke was embolic secondary to intracranial left MCA atherosclerosis and DAPT x3 months recommended. She has had issues with  hypokalemia requiring runs of K.   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  Therapy ongoing and patient continues to have patient scheduled right-sided weakness with aphasia and apraxia, left gaze deviation, visual deficits, sleep-wake disruption as well as question of dizziness with activity.  CIR recommended due to functional decline    See Team Conference Notes for weekly updates to the plan of care

## 2021-02-24 NOTE — Progress Notes (Signed)
PROGRESS NOTE   Subjective/Complaints:  Remains with severe aphasia, , will respond with Y/N but not accurate at times  Labs reviewed    ROS: Limited by cognition/aphasia  Objective:   No results found. Recent Labs    02/22/21 0512  WBC 5.1  HGB 8.2*  HCT 28.6*  PLT 331    Recent Labs    02/22/21 0512  NA 134*  K 4.1  CL 102  CO2 24  GLUCOSE 90  BUN 14  CREATININE 1.17*  CALCIUM 8.8*     Intake/Output Summary (Last 24 hours) at 02/24/2021 0959 Last data filed at 02/24/2021 0802 Gross per 24 hour  Intake 594 ml  Output --  Net 594 ml         Physical Exam: Vital Signs Blood pressure (!) 148/80, pulse 66, temperature 98.7 F (37.1 C), resp. rate 18, height 5\' 5"  (1.651 m), weight 109.3 kg, SpO2 98 %.   General: No acute distress Mood and affect are appropriate Heart: Regular rate and rhythm no rubs murmurs or extra sounds Lungs: Clear to auscultation, breathing unlabored, no rales or wheezes Abdomen: Positive bowel sounds, soft nontender to palpation, nondistended Extremities: No clubbing, cyanosis, or edema  Musculoskeletal:        General: No swelling. Normal range of motion.     Cervical back: Normal range of motion.  Skin:    General: Skin is warm and dry.  Neurological:     Mental Status: She is alert.     Comments: Alert, makes eye contact. Expressive and receptive aphasia/apraxia.  Able to occasionally follow a simple command. Appears to have right HH and/or neglect. Right central 7 and tongue deviation. RUE ?2-3/5 with apraxia and inconsistent effort. RLE grossly 4/5. Decreased sense of pain and LT RUE and RLE. No abnl tone appreciated   Assessment/Plan: 1. Functional deficits which require 3+ hours per day of interdisciplinary therapy in a comprehensive inpatient rehab setting. Physiatrist is providing close team supervision and 24 hour management of active medical problems listed  below. Physiatrist and rehab team continue to assess barriers to discharge/monitor patient progress toward functional and medical goals  Care Tool:  Bathing    Body parts bathed by patient: Chest, Face, Abdomen   Body parts bathed by helper: Front perineal area, Buttocks     Bathing assist Assist Level: Moderate Assistance - Patient 50 - 74%     Upper Body Dressing/Undressing Upper body dressing   What is the patient wearing?: Pull over shirt    Upper body assist Assist Level: Minimal Assistance - Patient > 75%    Lower Body Dressing/Undressing Lower body dressing      What is the patient wearing?: Pants     Lower body assist Assist for lower body dressing: Moderate Assistance - Patient 50 - 74%     Toileting Toileting Toileting Activity did not occur (Clothing management and hygiene only): N/A (no void or bm)  Toileting assist Assist for toileting: Contact Guard/Touching assist     Transfers Chair/bed transfer  Transfers assist     Chair/bed transfer assist level: Contact Guard/Touching assist     Locomotion Ambulation   Ambulation assist  Assist level: Contact Guard/Touching assist Assistive device: No Device Max distance: 200   Walk 10 feet activity   Assist     Assist level: Contact Guard/Touching assist Assistive device: No Device   Walk 50 feet activity   Assist    Assist level: Contact Guard/Touching assist Assistive device: No Device    Walk 150 feet activity   Assist    Assist level: Contact Guard/Touching assist Assistive device: No Device    Walk 10 feet on uneven surface  activity   Assist Walk 10 feet on uneven surfaces activity did not occur: Safety/medical concerns         Wheelchair     Assist Is the patient using a wheelchair?: No Type of Wheelchair: Manual Wheelchair activity did not occur: N/A         Wheelchair 50 feet with 2 turns activity    Assist    Wheelchair 50 feet with 2  turns activity did not occur: N/A   Assist Level: Total Assistance - Patient < 25%   Wheelchair 150 feet activity     Assist      Assist Level: Total Assistance - Patient < 25%   Blood pressure (!) 148/80, pulse 66, temperature 98.7 F (37.1 C), resp. rate 18, height 5\' 5"  (1.651 m), weight 109.3 kg, SpO2 98 %.  Medical Problem List and Plan: 1.  Functional deficits secondary to left MCA infarct with associated expressive and receptive aphasia/apraxia, right hemiparesis, and right visual-spatial deficits             -patient may shower             -ELOS/Goals: 16-18 days, supervision to min assist goals with PT, OT, SLP con't CIR PT, OT, SLP  2.  Antithrombotics: -DVT/anticoagulation:  Pharmaceutical: Lovenox             -antiplatelet therapy: DAPT x3 months 3. Pain Management: N/A 4. Mood: LCSW to follow for evaluation and support             -antipsychotic agents: N/A 5. Neuropsych: This patient is not capable of making decisions on her own behalf. 6. Skin/Wound Care: Routine pressure-relief measures. 7. Fluids/Electrolytes/Nutrition: Monitor intake/output.  Check  CMet in AM. 8.  HTN: Monitor blood pressures TID.              -- Continue metoprolol, lisinopril  11/12- BP's a little elevated 150/75- will con't regimen for now and monitor for trend Vitals:   02/23/21 1819 02/24/21 0446  BP: (!) 146/84 (!) 148/80  Pulse: 60 66  Resp: 18   Temp: 97.9 F (36.6 C) 98.7 F (37.1 C)  SpO2: 100% 98%   Minimal sys elevation no med changes  9:  Dyslipidemia: HDL- 32.  LDL 58-- > on Lipitor 80 mg 10.  Morbid obesity: BMI 41.  Educated on diet and exercise as well as weight loss to help promote overall health and mobility. 11.  Hypokalemia: Resolved with supplementation. 12.  Acute on chronic renal failure: BUN/serum 25/1.5 admission.    --Was 1.18  in 09/2020.  Monitor with serial checks.  11/12- Cr stable- con't regimen 13.  Iron deficiency anemia: Continue iron supplement.    14.  Insomnia: Continue melatonin as well as low-dose Seroquel at bedtime  11/12- slept well   15/ Constipation  11/12- LBM 11/8- will give sorbitol this afternoon after therapy.       LOS: 3 days A FACE TO FACE EVALUATION WAS PERFORMED  13/8  [name] 02/24/2021, 9:59 AM

## 2021-02-24 NOTE — Progress Notes (Signed)
Speech Language Pathology Daily Session Note  Patient Details  Name: [name] MRN: [id number] Date of Birth: Sep 25, 1975  Today's Date: 02/24/2021 SLP Individual Time: 1416-1500 SLP Individual Time Calculation (min): 44 min  Short Term Goals: Week 1: SLP Short Term Goal 1 (Week 1): Patient will express wants/needs via multimodal communication with max A verbal/visual cues SLP Short Term Goal 2 (Week 1): Patient will indicate preferences (yes/no, choice of 2) via pointing/gestures/head nods during 50% of occasions with max A multimodal cues SLP Short Term Goal 3 (Week 1): Patient will identify objects in field of 3 with 50% accuracy with max A verbal/visual cues SLP Short Term Goal 4 (Week 1): Pt will produce meaningful vocalizations during structured speech/language tasks with max A multimodal cues SLP Short Term Goal 5 (Week 1): Patient will consume current diet without overt s/sx of aspiration and min-to-mod A verbal cues for swallowing safety  Skilled Therapeutic Interventions: Pt seen for skilled ST with focus on speech and swallowing goals, mother and 3 children present throughout. Pt grabbing head on L side and wincing in what appeared to be sharp pain throughout session, RN provided Tylenol. Pt with inconsistent ability to communicate pain, pain level and location. Similarly to evaluation, pt with spontaneous verbalizations (yes, OK, no, sh*t), verbalizing yes/no questions ~75% of the time. Yes/no questions with ~50% accuracy with basic questions, more abstract yes/no with <25% accuracy. Pt provided vertical YES/NO communication board however pt primarily verbalizing at this time. SLP providing objects from [facility] kit to attempt to increase naming however max multimodal cues ineffective in increasing any meaningful verbalizations. Pt did not move articulators in any attempt to name items however was able to show correct use of items on 50% of trials (brought toothbrush to mouth,  extended arm with flashlight in it). Pt is an avid singer, SLP singing Happy Rudene Anda to attempt to elicit language which was ineffective at this time. SLP facilitating trials of regular texture snacks by providing min-mod A cues for small bites and use of liquid wash. Pt with no overt s/s aspiration or significant oral stasis with any trials. Pt left sitting EOB with family in room for needs. Cont ST POC.   Pain Pain Assessment Pain Scale: Faces Pain Score: 7  Faces Pain Scale: Hurts even more Pain Type: Acute pain Pain Location: Head Pain Descriptors / Indicators: Aching Pain Onset: On-going Pain Intervention(s): RN made aware;Medication (See eMAR)  Therapy/Group: Individual Therapy  Dewaine Conger 02/24/2021, 3:00 PM

## 2021-02-24 NOTE — Progress Notes (Addendum)
Occupational Therapy Session Note  Patient Details  Name: [name] MRN: [id number] Date of Birth: [birth year]  Today's Date: 02/24/2021 OT Individual Time: 8676-7209 OT Individual Time Calculation (min): 55 min    Short Term Goals: Week 1:  OT Short Term Goal 1 (Week 1): Pt will complete LB dressing with no more than Min A OT Short Term Goal 2 (Week 1): Pt will complete UB dressing with no more than Mod A OT Short Term Goal 3 (Week 1): Pt will complete oral care while standing at the sink with supervision balance assistance  Skilled Therapeutic Interventions/Progress Updates:    Pt seen for am session, working on eating breakfast EOB.  She demonstrates significant right neglect as well as motor planning deficits.  She was holding her fork in the right hand but eating her cut up breakfast with her fingers on the left hand.  Mod assist for re-direction with initial hand over hand assist to integrate the RUE with use of the fork.  Spoon was used to replace the fork and pt switched to the left hand, with which she was able to take a small bite of food.  Once, she ate all she wanted, had her complete transfer over to the sink with min guard assist for oral hygiene.  Max assist to locate toothbrush and toothpaste on the right of the sink with therapist having to hand the toothbrush and toothpaste to her as she does not follow commands to initiate this task.  She was able to open the toothpaste but then squeezed it out on her hand instead of the toothbrush.  Therapist provided hand over hand to place on the toothpaste.  She was able to brush her teeth but used the LUE after beginning with the right.  Therapist had to take the toothbrush to her and give her a cup with water to cue her to rinse.  She was then able to ambulate to the shower bench for bathing tasks.  Mod demonstrational cueing with min guard assist to remove clothing.  She needed max assist for rinsing with use of the hand held  shower as well as setup for use of the soap.  Mod demonstrational cueing was needed for sequencing bathing in order to wash her LB as well as buttocks.  She dried off sit to stand with mod assist for donning gown.  She transferred out to the EOB for donning paper scrubs with min assist and min demonstrational cueing for orientation of clothing.  When clothing was presented to her on the right side, she needed max verbal cueing to turn her head to locate the items therapist was trying to hand her.  Finished with combing of her hair at the sink and therapist assisting with placing bobby pins in her hair.  She was left with the call button in reach  resting in bed with the safety alarm in place.     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

## 2021-02-24 NOTE — Progress Notes (Signed)
Inpatient Rehabilitation Care Coordinator Assessment and Plan Patient Details  Name: [name] MRN: [id number] Date of Birth: 06/03/1975  Today's Date: 02/24/2021  Hospital Problems: Principal Problem:   Acute ischemic left middle cerebral artery (MCA) stroke [facility])  Past Medical History:  Past Medical History:  Diagnosis Date   Anemia    history of goiter    Hypertension    Low back pain    Stroke [facility])    Past Surgical History:  Past Surgical History:  Procedure Laterality Date   CESAREAN SECTION     IR CT HEAD LTD  02/16/2021   IR CT HEAD LTD  02/16/2021   IR CT HEAD LTD  02/16/2021   IR CT HEAD LTD  02/16/2021   IR PERCUTANEOUS ART THROMBECTOMY/INFUSION INTRACRANIAL INC DIAG ANGIO  02/16/2021   RADIOLOGY WITH ANESTHESIA N/A 02/16/2021   Procedure: IR WITH ANESTHESIA;  Surgeon: Julieanne Cotton, MD;  Location: [facility] OR;  Service: Radiology;  Laterality: N/A;   THYROIDECTOMY     Social History:  reports that she has never smoked. She has been exposed to tobacco smoke. She has never used smokeless tobacco. She reports current alcohol use. She reports current drug use. Drug: Marijuana.  Family / Support Systems Marital Status: Divorced Patient Roles: Parent, Other (Comment) (employee) Children: Damiah-daughter [phone] Other Supports: Lindie Spruce [phone] Anticipated Caregiver: Damiah-19 yo and Mom-Gail Ability/Limitations of Caregiver: supervision-min assist level Caregiver Availability: 24/7 Family Dynamics: Was living with three teenage children and working as a Lawyer for [employer]. Plan to live with Mom upon discharge. Mom involved and will assist  Social History Preferred language: English Religion: None Cultural Background: No issues Education: Certified CNA Health Literacy - How often do you need to have someone help you when you read instructions, pamphlets, or other written material from your doctor or pharmacy?: Patient unable to respond Writes:  Yes Employment Status: Employed Name of Employer: [employer] Return to Work Plans: Unsure if recovers from this CVA Marine scientist Issues: No issues Guardian/Conservator: None-according to MD pt is not capable of making her own decisions will look toward her 74 yo daughter-Damiah and her Mom if any decisions need to be made while here   Abuse/Neglect Abuse/Neglect Assessment Can Be Completed: Yes Physical Abuse: Denies Verbal Abuse: Denies Sexual Abuse: Denies Exploitation of patient/patient's resources: Denies Self-Neglect: Denies  Patient response to: Social Isolation - How often do you feel lonely or isolated from those around you?: Sometimes  Emotional Status Pt's affect, behavior and adjustment status: Pt is aphasic and makes eye contaxt but does not verbalize her answers. She does nod yes/no. Pt was very independent worked full time and cared for her three children. She has a history of CVA but no residual deficits Recent Psychosocial Issues: other health issues Psychiatric History: No history but would benefit from seeing neuro-psych when appropriate due to young age and deficits. Substance Abuse History: no issues  Patient / Family Perceptions, Expectations & Goals Pt/Family understanding of illness & functional limitations: Pt does seem to realize the reason she is here and Mom has a understanding due to has been through this with her before but not as severe. Premorbid pt/family roles/activities: Daughter, Mom, employee, friend, etc Anticipated changes in roles/activities/participation: resume Pt/family expectations/goals: Pt is not able to verbalize at this time. Mom states: " I am hopeful she will do well on rehab and progress."  [employer]: None Premorbid Home Care/DME Agencies: None Transportation available at discharge: Family Is the patient able to  respond to transportation needs?: Yes In the past 12 months, has lack of  transportation kept you from medical appointments or from getting medications?: No In the past 12 months, has lack of transportation kept you from meetings, work, or from getting things needed for daily living?: No Resource referrals recommended: Neuropsychology  Discharge Planning Living Arrangements: Children Support Systems: Children, Parent, Friends/neighbors Type of Residence: Private residence Insurance Resources: [employer] (specify county) Architect: Employment Surveyor, quantity Screen Referred: No Living Expenses: Lives with family Money Management: Patient Does the patient have any problems obtaining your medications?: No Home Management: Pt now daughter and Mom will assist Patient/Family Preliminary Plans: Go to Mom's home where her three children are staying. Between Mom and 76 yo daughter they can provide 24/7 care. Both hopeful she will do well here on rehab Care Coordinator Barriers to Discharge: Insurance for SNF coverage Care Coordinator Anticipated Follow Up Needs: HH/OP  Clinical Impression Pt making progress since she has gotten here on rehab and this is encouraging to her daughter and Mom. Pt will have 24/7 care via Mom and daughter. Hope she can progress like she has in the past after having CVA. Aware Christina-BSW will follow upon her return tomorrow.  Lucy Chris 02/24/2021, 1:55 PM

## 2021-02-25 DIAGNOSIS — I63512 Cerebral infarction due to unspecified occlusion or stenosis of left middle cerebral artery: Secondary | ICD-10-CM | POA: Diagnosis not present

## 2021-02-25 NOTE — Progress Notes (Addendum)
Speech Language Pathology Daily Session Note  Patient Details  Name: [name] MRN: [id number] Date of Birth: July 26, 1975  Today's Date: 02/25/2021 SLP Individual Time: 1330-1415 SLP Individual Time Calculation (min): 45 min  Short Term Goals: Week 1: SLP Short Term Goal 1 (Week 1): Patient will express wants/needs via multimodal communication with max A verbal/visual cues SLP Short Term Goal 2 (Week 1): Patient will indicate preferences (yes/no, choice of 2) via pointing/gestures/head nods during 50% of occasions with max A multimodal cues SLP Short Term Goal 3 (Week 1): Patient will identify objects in field of 3 with 50% accuracy with max A verbal/visual cues SLP Short Term Goal 4 (Week 1): Pt will produce meaningful vocalizations during structured speech/language tasks with max A multimodal cues SLP Short Term Goal 5 (Week 1): Patient will consume current diet without overt s/sx of aspiration and min-to-mod A verbal cues for swallowing safety  Skilled Therapeutic Interventions: Pt received in bed and agreeable to skilled ST intervention with focus on language goals. Pt requested to use bathroom via gesture by pointing to bathroom door. Pt completed toileting tasks independently however required mod-to-max A verbal and visual cues for following commands, visual scanning, sequencing (flushing toilet, washing hands at sink), and safety due to impulsivity (exiting wheelchair with obstacles in path). SLP facilitated session by providing mod-to-max A verbal cues to identify field of 3 vertical written letters to achieve 56% (9/16) accuracy. She required mod A verbal cues for answering biographical yes/no questions to achieve 85% (12/14) accuracy. Pt exhibited tendency to nod her head yes when she intended to say no and exhibited difficulty nodding her head volitionally. She required max A for object identification using objects from [place] achieve 62% (10/16) accuracy in field of 2.  Attempted automatic speech tasks with max A multimodal cues (i.e., counting 1-10, singing Amazing grace) though pt unable to elicit any meaningful vocalizations. Patient was left in bed with alarm activated and immediate needs within reach at end of session. Continue per current plan of care.     Pain Pain Assessment Pain Scale: 0-10 Pain Score: 0-No pain  Therapy/Group: Individual Therapy  Patty Sermons 02/25/2021, 2:19 PM

## 2021-02-25 NOTE — Progress Notes (Signed)
PROGRESS NOTE   Subjective/Complaints:  Remains with severe aphasia, apraxia   ROS: Limited by cognition/aphasia  Objective:   No results found. Recent Labs    02/24/21 0959  WBC 5.2  HGB 8.0*  HCT 27.9*  PLT 349    Recent Labs    02/24/21 0959  NA 135  K 3.8  CL 103  CO2 25  GLUCOSE 100*  BUN 13  CREATININE 1.36*  CALCIUM 8.8*     Intake/Output Summary (Last 24 hours) at 02/25/2021 0904 Last data filed at 02/24/2021 1820 Gross per 24 hour  Intake 260 ml  Output --  Net 260 ml         Physical Exam: Vital Signs Blood pressure (!) 147/91, pulse 65, temperature 98.6 F (37 C), temperature source Oral, resp. rate 14, height 5\' 5"  (1.651 m), weight 109.3 kg, SpO2 100 %.  General: No acute distress Mood and affect are appropriate Heart: Regular rate and rhythm no rubs murmurs or extra sounds Lungs: Clear to auscultation, breathing unlabored, no rales or wheezes Abdomen: Positive bowel sounds, soft nontender to palpation, nondistended Extremities: No clubbing, cyanosis, or edema  Musculoskeletal:        General: No swelling. Normal range of motion.     Cervical back: Normal range of motion.  Skin:    General: Skin is warm and dry.  Neurological:     Mental Status: She is alert.     Comments: Alert, makes eye contact. Expressive and receptive aphasia/apraxia.  Able to occasionally follow a simple command. Appears to have right HH and/or neglect. Right central 7 and tongue deviation. RUE ?2-3/5 with apraxia and inconsistent effort. RLE grossly 4/5. Decreased sense of pain and LT RUE and RLE. No abnl tone appreciated   Assessment/Plan: 1. Functional deficits which require 3+ hours per day of interdisciplinary therapy in a comprehensive inpatient rehab setting. Physiatrist is providing close team supervision and 24 hour management of active medical problems listed below. Physiatrist and rehab team  continue to assess barriers to discharge/monitor patient progress toward functional and medical goals  Care Tool:  Bathing    Body parts bathed by patient: Right arm, Left arm, Chest, Abdomen, Front perineal area, Right upper leg, Left upper leg, Right lower leg, Left lower leg, Face, Buttocks   Body parts bathed by helper: Front perineal area, Buttocks     Bathing assist Assist Level: Minimal Assistance - Patient > 75%     Upper Body Dressing/Undressing Upper body dressing   What is the patient wearing?: Pull over shirt    Upper body assist Assist Level: Minimal Assistance - Patient > 75%    Lower Body Dressing/Undressing Lower body dressing      What is the patient wearing?: Underwear/pull up, Pants     Lower body assist Assist for lower body dressing: Minimal Assistance - Patient > 75%     Toileting Toileting Toileting Activity did not occur (Clothing management and hygiene only): N/A (no void or bm)  Toileting assist Assist for toileting: Contact Guard/Touching assist     Transfers Chair/bed transfer  Transfers assist     Chair/bed transfer assist level: Contact Guard/Touching assist  Locomotion Ambulation   Ambulation assist      Assist level: Contact Guard/Touching assist Assistive device: No Device Max distance: 200   Walk 10 feet activity   Assist     Assist level: Contact Guard/Touching assist Assistive device: No Device   Walk 50 feet activity   Assist    Assist level: Contact Guard/Touching assist Assistive device: No Device    Walk 150 feet activity   Assist    Assist level: Contact Guard/Touching assist Assistive device: No Device    Walk 10 feet on uneven surface  activity   Assist Walk 10 feet on uneven surfaces activity did not occur: Safety/medical concerns         Wheelchair     Assist Is the patient using a wheelchair?: No Type of Wheelchair: Manual Wheelchair activity did not occur: N/A          Wheelchair 50 feet with 2 turns activity    Assist    Wheelchair 50 feet with 2 turns activity did not occur: N/A   Assist Level: Total Assistance - Patient < 25%   Wheelchair 150 feet activity     Assist      Assist Level: Total Assistance - Patient < 25%   Blood pressure (!) 147/91, pulse 65, temperature 98.6 F (37 C), temperature source Oral, resp. rate 14, height 5\' 5"  (1.651 m), weight 109.3 kg, SpO2 100 %.  Medical Problem List and Plan: 1.  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  Antithrombotics: -DVT/anticoagulation:  Pharmaceutical: Lovenox             -antiplatelet therapy: DAPT x3 months 3. Pain Management: N/A 4. Mood: LCSW to follow for evaluation and support             -antipsychotic agents: N/A 5. Neuropsych: This patient is not capable of making decisions on her own behalf. 6. Skin/Wound Care: Routine pressure-relief measures. 7. Fluids/Electrolytes/Nutrition: Monitor intake/output.  Check  CMet in AM. 8.  HTN: Monitor blood pressures TID.              -- Continue metoprolol, lisinopril  11/12- BP's a little elevated 150/75- will con't regimen for now and monitor for trend Vitals:   02/25/21 0426 02/25/21 0710  BP: (!) 147/91   Pulse: 65   Resp: 14   Temp: 98.6 F (37 C)   SpO2: (!) 82% 100%   Minimal sys elevation no med changes  9:  Dyslipidemia: HDL- 32.  LDL 58-- > on Lipitor 80 mg 10.  Morbid obesity: BMI 41.  Educated on diet and exercise as well as weight loss to help promote overall health and mobility. 11.  Hypokalemia: Resolved with supplementation. 12.  Acute on chronic renal failure: BUN/serum 25/1.5 admission.    --Was 1.18  in 09/2020.  Monitor  with serial checks.  11/12- Cr stable- con't regimen 13.  Iron deficiency anemia: Continue iron supplement.   14.  Insomnia: Continue melatonin as well as low-dose Seroquel at bedtime  11/12- slept well   15/ Constipation  11/12- LBM 11/8- will give sorbitol this afternoon after therapy.  LOS: 4 days A FACE TO FACE EVALUATION WAS PERFORMED  Erick Colace 02/25/2021, 9:04 AM

## 2021-02-25 NOTE — Progress Notes (Signed)
Occupational Therapy Session Note  Patient Details  Name: [name] MRN: [id number] Date of Birth: January 29, 1976  Today's Date: 02/25/2021 OT Individual Time: 1001-1059 OT Individual Time Calculation (min): 58 min    Short Term Goals: Week 1:  OT Short Term Goal 1 (Week 1): Pt will complete LB dressing with no more than Min A OT Short Term Goal 2 (Week 1): Pt will complete UB dressing with no more than Mod A OT Short Term Goal 3 (Week 1): Pt will complete oral care while standing at the sink with supervision balance assistance  Skilled Therapeutic Interventions/Progress Updates:    Session 1: (1001-1059)  No sign of pain noted during session.  She completed shower and dressing tasks to start session.  Pt's mother and her daughter present throughout.  Discussed her current progress and need for assist for motor planning and sequencing tasks with family.  Pt still with limited verbal output, only stating "yes" when asked questions throughout session, which mostly were incorrect such as "Is this your sister?" When pointing to her daughter.  She was able to ambulate from the EOB to the shower seat with supervision and undress at the same level.  She was able to complete shower with min assist overall and mod demonstrational cueing for sequencing.  Max demonstrational cueing for sequencing use of the hand held shower as well as applying soap to the washcloth secondary to motor planning.  She would was unable to manipulate the hand held shower as she was pointing it to the wall when she though she was rinsing her body off.  She would also stand at times and turn around as if she was rinsing off her body however the therapist was holding the shower head down low.  She was able to complete drying off mostly in standing and then transfer out to the EOB for dressing tasks.  She was able to sequence and complete dressing with setup with therapist just handing her clothing.  She then combed her hair at  the sink with setup of comb in the left visual field.  She needed mod assist from therapist to put her hair up with a bobby pin.  Next, had her ambulate down to the ADL kitchen with supervision and mod demonstrational cueing for right turns.  She was given 4 pieces of fruit to identify with therapist asking her to pick up a specific one.  She was unable to correctly complete any.  This was the same when given three utensils as well to pick from.  She was however able to complete picking out items from the pantry if given 3 different boxes and told "hand me the spaghetti!".  She was successful 95% of the time.  Finished session with ambulation back to the room where she was left sitting EOB with her daughter and mom providing supervision.  Call button and phone in reach.     Session 2: (9030-0923)  Pt in bed to start willing to follow demonstrational and instructional commands to transfer to the EOB.  She was able to complete with supervision on the right side of the bed and then stood up before therapist had told her to.  Safety belt was applied and then she ambulated down to the ortho gym with overall supervision for balance with mod demonstrational cueing for direction, especially to turn to the right.  Once in the gym therapist had her attempt visual scanning with use of the BITs.  She was unable to motor plan  using her index finger of the left hand when given hand over hand, so stylus was integrated.  Therapist provided max hand over hand assistance to find and press the blue dots with therapist also turning her head to the direction of the dot.  She was unable to complete after several hand over hand attempts and instead just brought the stylus to her nose and left it there.  Transitioned over to the [facility] where she attempted ball toss and catch with a small beach ball.  She was able to catch it 75% of the time when tossed from left of midline, but she could not motor plan how to throw it back.  Max hand over  hand assist with BUEs to try and throw it however, after multiple attempts, she would place the ball above her head and then drop it to the ground.  Next, had her complete use of the upper extremity Ergonometer.  She was able to complete 3 mins of BUE use with resistance on level 8 and RPMs maintained above 30.  Therapist initially gave mod assist for the first two revolutions, and the she was able to continue peddling.  She maintained grip on the right side as well throughout.  Had her complete a final set of 3 mins with only the RUE which she was able to complete as well.  Next had her sit on the mat and work on trying to pick up or point to objects of different colors when asked or just picking up an object when it was named in a field of 3.  She was unable to pick up the correct color when given or any object when named with any trials. Ambulated back to the room with mod demonstrational cueing for finding her room on the right.  She was left in the bed with the call button and phone in reach and safety alarm in place.    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

## 2021-02-25 NOTE — Progress Notes (Signed)
Physical Therapy Session Note  Patient Details  Name: [name] MRN: [id number] Date of Birth: 1975/06/16  Today's Date: 02/25/2021 PT Individual Time: 0806-0900 PT Individual Time Calculation (min): 54 min   Short Term Goals: Week 1:  PT Short Term Goal 1 (Week 1): Pt will perform all functional standing transfers with improved safety awareness and supervision/ CGA. PT Short Term Goal 2 (Week 1): Pt will demo improved functional strength with improved RLE foot clearance through 100% of amb bouts. PT Short Term Goal 3 (Week 1): Pt will demonstrate improved scanning of visual field and improve obstacle clearance to R side by 50%.  Skilled Therapeutic Interventions/Progress Updates:    Pt received sitting EOB eating breakfast - pt appears agreeable to therapy session. Pt noted to have dropped spoon on floor and is eating using fingers with L UE. Therapist attempts to engage pt to use R hand to eat by placing fork in her hand but pt unable to attend to that side to engage it in this task. Pt only had small amount left to eat and was quickly finished. Sit<>stands, no AD, with CGA for steadying during session. Standing at sink hand hygiene with pt able to integrate R hand into this more automatic bimanual task but continues to have R inattention with inability to locate items on R side - once finished drying hands, pt held paper towel in R hand and is unaware of this requiring total hand-over-hand assist to place towel in trash. Gait in/out bathroom, no AD, with CGA. Pt able to manage LB clothing without assist and continently voided bladder on toilet - requires max cuing to locate toilet paper then pt able to wipe using R hand but then drops the toilet paper on ground due to inability to locate toilet on R side. Performed hand washing at sink as described above then engaged pt in washing her face - again with automatic bi-manual task pt able to use R UE to assist with ringing out wash cloth  but quick to not include that hand nor attend to it when not performing this type of task. Performed oral care with  hand-over-hand assist using L UE to locate items on R side of sink then total assist to don toothpaste on toothbrush because pt unable to attend to brush in R hand; however, pt used R UE to complete brushing teeth. Gait training ~165ft to main therapy gym, no AD, CGA for steadying. Sitting EOM worked on identifying colors of small bean bags then using R UE to place them on table in front of her - requires hand-over-hand assist to initiate R UE involvement in task with pt frequently having difficulty understanding task due to aphasia. Gait into ADL apartment carrying bean bags in both hands for a more quiet, focused environment to complete that same task, but in standing. Pt noted to have ability to attend to object at midline but if it is even slightly R of midline pt unable to visually find it despite multimodal cuing unless you take L hand and move it over to the object on the R - pt starts doing this herself to try to find objects on R. Gait training ~238ft back to room, no AD, with CGA for steadying - requires tactile cuing/facilitation to perform R turns back to her room. Pt left seated EOB with needs in reach and bed alarm on.   Therapy Documentation Precautions:  Precautions Precautions: Fall Precaution Comments: R inattention/ neglect, R eye blindness with R  field cut, apraxia, global aphasia Restrictions Weight Bearing Restrictions: No   Pain:  No indications of pain during session.  Therapy/Group: Individual Therapy  Ginny Forth , PT, DPT, NCS, CSRS  02/25/2021, 7:50 AM

## 2021-02-26 NOTE — Patient Care Conference (Signed)
Inpatient RehabilitationTeam Conference and Plan of Care Update Date: 02/26/2021   Time: 10:47 AM    Patient Name: [name]      Medical Record Number: [id number]  Date of Birth: [birth year] Sex: Female         Room/Bed: 4W13C/4W13C-01 Payor Info: Payor: [place] MEDICAID PREPAID HEALTH PLAN / Plan: [place] MEDICAID [employer] [place] / Product Type: *No Product type* /    Admit Date/Time:  02/21/2021  3:11 PM  Primary Diagnosis:  Acute ischemic left middle cerebral artery (MCA) stroke [facility])  Hospital Problems: Principal Problem:   Acute ischemic left middle cerebral artery (MCA) stroke [facility])    Expected Discharge Date: Expected Discharge Date: 03/05/21  Team Members Present: Physician leading conference: Dr. Claudette Laws Social Worker Present: Lavera Guise, BSW Nurse Present: Chana Bode, RN PT Present: Casimiro Needle, PT OT Present: Lina Sayre, OT SLP Present: Eilene Ghazi, SLP PPS Coordinator present : Edson Snowball, PT     Current Status/Progress Goal Weekly Team Focus  Bowel/Bladder   continent of bowel and bladder; last BM 11/12  remain continent; not get constipated  Assess qshift and PRN   Swallow/Nutrition/ Hydration   min A  min A  tolerance of current diet with safe swallowing precautions. Pt appears to have more self feeding needs than swallowing   ADL's   Supervision for UB selfcare with min assist for LB selfcare.  Does better with dressing compared to bathing secondary to apraxia when motor planning putting soap on the washcloth or using the hand held shower.  Right neglect present with possible right visual field cut, but unable to accurately assess secondary to receptive deficits.  RUE with decreased coordination but uses functionally for bilateral tasks when they are spontaneous.  Supervision for all transfers and mobility  supervision level  cognitive retraining, transfer training, DME education, balance retraining,  therapeutic activites, pt/family education   Mobility   supervision bed mobility, CGA sit<>stand and stand pivot transfers without AD, CGA gait up to 251ft no AD - pt has severe R inattention/neglect with severe apraxia and aphasia requiring total assist and cuing to locate and find objects on R side as well as to integrate RUE into functional tasks  supervision overall at ambulatory level  activity tolerance, transfer training, gait training, R hemibody NMR, R attention, motor planning for functional mobility tasks to navigate environment   Communication   max A for comprehension, max-to-total A for expression  mod-to-maxA  Following commands, answering yes/no questions, object ID, written letter/word ID, automatic speech tasks.   Safety/Cognition/ Behavioral Observations            Pain   No pain during shift; will continue to monitor  Pain < 3  Assess qshift and PRN   Skin   Skin dry and intact; No bruises or abrasions noted.  remain intact  Assess skin qshift and PRN     Discharge Planning:  Discharging home with mother and 3 teens to provide assistance   Team Discussion: Patient with aphasia, apraxia and right inattention with possible field cut post CVA. Physically doing well even with some motor planning difficulties and anticipated long term speech needs and safety management assistance. Patient on target to meet rehab goals: Yes, currently supervision for upper body care and min assist for lower body care. CGA for ambulation and steps. Requires max assist for language and expression of needs. Goals for discharge set for supervision.  *See Care Plan and progress notes for long and  short-term goals.   Revisions to Treatment Plan:  None Teaching Needs: Safety, medication management, secondary risk management, etc.  Current Barriers to Discharge: Decreased caregiver support  Possible Resolutions to Barriers: Family education with sister and mother HH follow up services  recommended     Medical Summary Current Status: severe aphasia, poor safety awareness, possible right neglect  Barriers to Discharge: Incontinence;Medical stability   Possible Resolutions to [employer] Focus: difficulty with teaching /education, will need extensive caregiver education   Continued Need for Acute Rehabilitation Level of Care: The patient requires daily medical management by a physician with specialized training in physical medicine and rehabilitation for the following reasons: Direction of a multidisciplinary physical rehabilitation program to maximize functional independence : Yes Medical management of patient stability for increased activity during participation in an intensive rehabilitation regime.: Yes Analysis of laboratory values and/or radiology reports with any subsequent need for medication adjustment and/or medical intervention. : Yes   I attest that I was present, lead the team conference, and concur with the assessment and plan of the team.   Chana Bode B 02/26/2021, 1:36 PM

## 2021-02-26 NOTE — Progress Notes (Signed)
Speech Language Pathology Daily Session Note  Patient Details  Name: [name] MRN: [id number] Date of Birth: 01-12-1976  Today's Date: 02/26/2021 SLP Individual Time: 9735-3299 SLP Individual Time Calculation (min): 45 min  Short Term Goals: Week 1: SLP Short Term Goal 1 (Week 1): Patient will express wants/needs via multimodal communication with max A verbal/visual cues SLP Short Term Goal 2 (Week 1): Patient will indicate preferences (yes/no, choice of 2) via pointing/gestures/head nods during 50% of occasions with max A multimodal cues SLP Short Term Goal 3 (Week 1): Patient will identify objects in field of 3 with 50% accuracy with max A verbal/visual cues SLP Short Term Goal 4 (Week 1): Pt will produce meaningful vocalizations during structured speech/language tasks with max A multimodal cues SLP Short Term Goal 5 (Week 1): Patient will consume current diet without overt s/sx of aspiration and min-to-mod A verbal cues for swallowing safety  Skilled Therapeutic Interventions: Pt received semi reclined in bed and agreeable to skilled ST intervention with focus on language goals. Pt responded to biographical yes/no questions with 90% accuracy. Pt attempted to make a request although unable to elicit meaningful language with the exception of perseverating on "yeah" while shaking her hands. Through asking yes/no questions, SLP was able to identify that pt wanted to change her clothes (wearing hospital provided scrubs at the time). During this encounter, pt verbalized "no" while shaking her head more consistently than observed during previous session which significantly improved communication effectiveness. No personal clothes were found in room. Will need to request for family to bring clothing items. SLP also facilitated session by providing max A multimodal cues to picture identification in field of 2 to achieve 50% accuracy. Pt with increased difficulty secondary to right visual  inattention in which pt required max A multimodal cues. Provided max A verbal and visual cues for identifying colored balls in field of 3 to achieve 75% accuracy. Provided max A multimodal cues for number identification in vertical field of 3 to achieve 20% accuracy. SLP facilitated automatic speech (singing happy birthday). Pt moved her body to the beat and frequently verbalized "yeah" though unable elicit words in song. Patient was left in bed with alarm activated and immediate needs within reach at end of session. Continue per current plan of care.      Pain Pain Assessment Pain Scale: 0-10 Pain Score: 0-No pain  Therapy/Group: Individual Therapy  Tamala Ser 02/26/2021, 9:27 AM

## 2021-02-26 NOTE — Progress Notes (Signed)
Physical Therapy Session Note  Patient Details  Name: [name] MRN: [id number] Date of Birth: 1976/03/04  Today's Date: 02/26/2021 PT Individual Time: 0086-7619 PT Individual Time Calculation (min): 54 min   Short Term Goals: Week 1:  PT Short Term Goal 1 (Week 1): Pt will perform all functional standing transfers with improved safety awareness and supervision/ CGA. PT Short Term Goal 2 (Week 1): Pt will demo improved functional strength with improved RLE foot clearance through 100% of amb bouts. PT Short Term Goal 3 (Week 1): Pt will demonstrate improved scanning of visual field and improve obstacle clearance to R side by 50%.  Skilled Therapeutic Interventions/Progress Updates:    Pt received sitting EOB and nods head "yes" in agreement to therapy session. Sit<>stands, no AD, with CGA for safety during session. Gait ~169ft to ADL apartment, no AD, with CGA for safety - no significant gait deviations despite decreased speed; however, requires total cuing and manual facilitation to turn R into the apartment. Participated in standing dual-cognitive-task and RUE NMR/attention challenge of picking up the following objects on command: started with spoon and fork progressed to mug/cup, plate, and bowl - started with just 2 items then progressed to 3 - pt noted to frequently pick up the object to "inspect it" to help with determining whether it was the correct item and continues to have severe R inattention/neglect with most difficulty finding objects on R side. Transitioned to performing same task but with 2 then 3 pieces of fruit (apple, watermelon, and grapes) progressed to having pt pick up the object on command and then carry it to the table - pt continues to require total cuing and manual facilitation to turn R and visually scan R to locate items on that side especially after moving from the table to/from kitchen counter - with mod cuing pt able to understand commands to take fruit and  put it on the table. Performed same task but with pizza box, Cheez-it box, and pasta salad box with pt having good recognition of these items but continues to have difficulty locating the one on R side and pt continues to pick up the object to inspect it; however, did notice pt with difficulty carrying over this task when moved to a different counter in the kitchen, unsure of why. Gait ~142ft to main therapy gym, no AD, with CGA. Stair navigation ascending/descending 12 steps using B HRs with light min assist for balance due to pt leaving R UE back on the HR and not advancing it forward with her when descending - pt unaware and requiring total hand-over-hand assist to bring it with her - self selects reciprocal pattern on ascent and step-to on descent. Gait ~230ft to room focusing on R turn and R scanning around the day room with CGA. Pt's family in room upon return with her mom saying "hey" to patient and then pt suddenly becoming emotional and tearful - therapist and family provided emotional support with improvement. Pt left seated EOB with bed alarm on and family present.  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  Therapy/Group: Individual Therapy  Ginny Forth , PT, DPT, NCS, CSRS 02/26/2021, 8:02 AM

## 2021-02-26 NOTE — Progress Notes (Signed)
Occupational Therapy Session Note  Patient Details  Name: [name] MRN: [id number] Date of Birth: December 16, 1975  Today's Date: 02/26/2021 OT Individual Time: 5638-7564 OT Individual Time Calculation (min): 54 min    Short Term Goals: Week 1:  OT Short Term Goal 1 (Week 1): Pt will complete LB dressing with no more than Min A OT Short Term Goal 2 (Week 1): Pt will complete UB dressing with no more than Mod A OT Short Term Goal 3 (Week 1): Pt will complete oral care while standing at the sink with supervision balance assistance  Skilled Therapeutic Interventions/Progress Updates:  Pt greeted supine in bed with family present. PT and family report wanting pt to take a shower, pt responds with "yes" when asked if she wants to shower. Pt completed ambulatory shower transfer with no AD and MIN A. Pt required MAX verbal and visual cues to sequence stepping into shower. Pt completed bathing with overall MOD A, pt needed MAX cues for motor planning as pt noted to hold hand held shower backwards with no awareness. Pt was able to follow simple commands such as "wash you arm." Pt needed max multimodal cues for safety and motor planning as pt sit<>standing multiple times during shower. Pt exited shower with no Ad and MIN A. Pt donned shirt with s/u assist from EOB, MAX A for LB dressing as pt with difficulty orienting clothing and needed assist for motor planning. Pt able to don slip on shoes with set- up assist. CGA for stand pivot transfer from EOB>w/c, pt transported to gym with total A where remainder of session focused on therapeutic activities focused on following commands, visual scanning and bilateral integration. Sat on pts R side and instructed pt to match colors of bean bags to colored dots on table, pt unable to complete task without MAX hand over hand assist, pt unable to scan to R side without auditory stimulus provided to scan to R side and adjusted OTA position to directly in front of  pt. Worked on passing bean bag from R<>L hand to promote bilateral integration and R sided attention. Additionally worked on sliding bean bags R<>L and forward and backwards with both hands positioned over top of bean bag to promote bilateral integration and NMR. Attempted to have pt write her name but unable to do so without hand over hand assist. Worked on placing pegs in peg board to facilitate visual scanning and command following. Pt needed MAX auditory stimulus to scan to R side to locate pegs and hand over hand assist to place in board. Pt returned to room with total A where pt completed stand pivot transfer from w/c>EOB with CGA. Pt left supine in bed with bed alarm activated.    Therapy Documentation Precautions:  Precautions Precautions: Fall, Other (comment) Precaution Comments: R inattention/ neglect, apraxia, global aphasia Restrictions Weight Bearing Restrictions: No  Pain: no s/s of pain noted    Therapy/Group: Individual Therapy  [facility] 02/26/2021, 3:03 PM

## 2021-02-26 NOTE — Progress Notes (Signed)
Notified by daughter ( Miah),for update, support provided

## 2021-02-26 NOTE — Progress Notes (Signed)
PROGRESS NOTE   Subjective/Complaints:  Remains with severe aphasia, apraxia Pt mildly agitated per family wants to go home.  DIscussed d/c date  after phrasing it several times pt appeared to understand 1 wk (day before Thanksgiving)  ROS: Limited by cognition/aphasia  Objective:   No results found. Recent Labs    02/24/21 0959  WBC 5.2  HGB 8.0*  HCT 27.9*  PLT 349    Recent Labs    02/24/21 0959  NA 135  K 3.8  CL 103  CO2 25  GLUCOSE 100*  BUN 13  CREATININE 1.36*  CALCIUM 8.8*     Intake/Output Summary (Last 24 hours) at 02/26/2021 0950 Last data filed at 02/26/2021 0845 Gross per 24 hour  Intake 695 ml  Output --  Net 695 ml         Physical Exam: Vital Signs Blood pressure 137/83, pulse 65, temperature 98 F (36.7 C), temperature source Oral, resp. rate 18, height '5\' 5"'  (1.651 m), weight 109.3 kg, SpO2 99 %.  General: No acute distress Mood and affect are appropriate Heart: Regular rate and rhythm no rubs murmurs or extra sounds Lungs: Clear to auscultation, breathing unlabored, no rales or wheezes Abdomen: Positive bowel sounds, soft nontender to palpation, nondistended Extremities: No clubbing, cyanosis, or edema  Musculoskeletal:        General: No swelling. Normal range of motion.     Cervical back: Normal range of motion.  Skin:    General: Skin is warm and dry.  Neurological:     Mental Status: She is alert.     Comments: Alert, makes eye contact. Expressive and receptive aphasia/apraxia.  Able to occasionally follow a simple command. +right neglect. Right central 7 and tongue deviation. RUE ?2-3/5 with apraxia and inconsistent effort. RLE grossly 4/5. Decreased sense of pain and LT RUE and RLE. No abnl tone appreciated   Assessment/Plan: 1. Functional deficits which require 3+ hours per day of interdisciplinary therapy in a comprehensive inpatient rehab setting. Physiatrist is  providing close team supervision and 24 hour management of active medical problems listed below. Physiatrist and rehab team continue to assess barriers to discharge/monitor patient progress toward functional and medical goals  Care Tool:  Bathing    Body parts bathed by patient: Right arm, Left arm, Chest, Abdomen, Front perineal area, Right upper leg, Left upper leg, Right lower leg, Left lower leg, Face, Buttocks   Body parts bathed by helper: Front perineal area, Buttocks     Bathing assist Assist Level: Minimal Assistance - Patient > 75%     Upper Body Dressing/Undressing Upper body dressing   What is the patient wearing?: Hospital gown only    Upper body assist Assist Level: Minimal Assistance - Patient > 75%    Lower Body Dressing/Undressing Lower body dressing      What is the patient wearing?: Underwear/pull up     Lower body assist Assist for lower body dressing: Minimal Assistance - Patient > 75%     Toileting Toileting Toileting Activity did not occur (Clothing management and hygiene only): N/A (no void or bm)  Toileting assist Assist for toileting: Contact Guard/Touching assist  Transfers Chair/bed transfer  Transfers assist     Chair/bed transfer assist level: Contact Guard/Touching assist     Locomotion Ambulation   Ambulation assist      Assist level: Contact Guard/Touching assist Assistive device: No Device Max distance: 200   Walk 10 feet activity   Assist     Assist level: Contact Guard/Touching assist Assistive device: No Device   Walk 50 feet activity   Assist    Assist level: Contact Guard/Touching assist Assistive device: No Device    Walk 150 feet activity   Assist    Assist level: Contact Guard/Touching assist Assistive device: No Device    Walk 10 feet on uneven surface  activity   Assist Walk 10 feet on uneven surfaces activity did not occur: Safety/medical concerns          Wheelchair     Assist Is the patient using a wheelchair?: No Type of Wheelchair: Manual Wheelchair activity did not occur: N/A         Wheelchair 50 feet with 2 turns activity    Assist    Wheelchair 50 feet with 2 turns activity did not occur: N/A   Assist Level: Total Assistance - Patient < 25%   Wheelchair 150 feet activity     Assist      Assist Level: Total Assistance - Patient < 25%   Blood pressure 137/83, pulse 65, temperature 98 F (36.7 C), temperature source Oral, resp. rate 18, height '5\' 5"'  (1.651 m), weight 109.3 kg, SpO2 99 %.  Medical Problem List and Plan: 1.  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 Formulized individual treatment plan based on medical history, underlying problem and comorbidities.  2.  Antithrombotics: -DVT/anticoagulation:  Pharmaceutical: Lovenox             -antiplatelet therapy: DAPT x3 months 3. Pain Management: N/A 4. Mood: LCSW to follow for evaluation and support             -antipsychotic agents: N/A 5. Neuropsych: This patient is not capable of making decisions on her own behalf. 6. Skin/Wound Care: Routine pressure-relief measures. 7. Fluids/Electrolytes/Nutrition: Monitor intake/output.  Check  CMet in AM. 8.  HTN: Monitor blood pressures TID.              -- Continue metoprolol, lisinopril  11/12- BP's a little elevated 150/75- will con't regimen for now and monitor for trend Vitals:   02/25/21 1932 02/26/21 0404  BP: (!) 148/79 137/83  Pulse: 65 65  Resp: 18 18  Temp: 98.4 F (36.9 C) 98 F (36.7 C)  SpO2:  100% 99%  Controlled 11/16 9:  Dyslipidemia: HDL- 32.  LDL 58-- > on Lipitor 80 mg 10.  Morbid obesity: BMI 41.  Educated on diet and exercise as well as weight loss to help promote overall health and mobility. 11.  Hypokalemia: Resolved with supplementation. 12.  Acute on chronic renal failure: BUN/serum 25/1.5 admission.    --Was 1.18  in 09/2020.  Monitor with serial checks.  11/12- Cr stable- con't regimen 13.  Iron deficiency anemia: Continue iron supplement.   14.  Insomnia: Continue melatonin as well as low-dose Seroquel at bedtime  11/12- slept well   15/ Constipation  11/12- LBM 11/8- will give sorbitol this afternoon after therapy.       LOS: 5 days A FACE TO FACE EVALUATION WAS PERFORMED  Charlett Blake 02/26/2021, 9:50 AM

## 2021-02-26 NOTE — Progress Notes (Addendum)
Patient ID: [name], female   DOB: 1975/12/18, 45 y.o.   MRN: [id number]  Team Conference Report to Patient/Family  Team Conference discussion was reviewed with the patient and caregiver, including goals, any changes in plan of care and target discharge date.  Patient and caregiver express understanding and are in agreement.  The patient has a target discharge date of 03/05/21.  Sw met with pt and family to discuss conference updates. Family education scheduled on 11/21 11/21 9-13 and 1-2 PM   [name] 02/26/2021, 2:20 PM

## 2021-02-26 NOTE — Progress Notes (Signed)
Physical Therapy Session Note  Patient Details  Name: [name] MRN: [id number] Date of Birth: 26-Oct-1975  Today's Date: 02/26/2021 PT Individual Time: 1130-1158 PT Individual Time Calculation (min): 28 min   Short Term Goals: Week 1:  PT Short Term Goal 1 (Week 1): Pt will perform all functional standing transfers with improved safety awareness and supervision/ CGA. PT Short Term Goal 2 (Week 1): Pt will demo improved functional strength with improved RLE foot clearance through 100% of amb bouts. PT Short Term Goal 3 (Week 1): Pt will demonstrate improved scanning of visual field and improve obstacle clearance to R side by 50%.  Skilled Therapeutic Interventions/Progress Updates:    Patient received sitting in wc, family present, agreeable to PT. She did not indicate that she had pain. Patient ambulating with light CGA and Max verbal cues for path finding to ADL apartment. With field of 2 fruit, patient able to select correct fruit when prompted 80% of the time. Expanded field to 3 fruits and patient was ~75% accurate with her selections. Once field was 4 fruit, patient 30% accurate. She remains with R head turn to scan field using L eye. She appears able to compensate for R visual field cut (?) or blindness (?) but remains with R neglect. Patient able to select correct utensil (fork vs spoon) ~75% of the time and appeared to be feeling the utensil for its shape (?) prior to handing it to PT. Patient ambulating in hall collecting dots on the R side of the wall. She needed initially Max cues to complete task correctly fading to [facility] with repeating the same task. She continues to be  very apraxic, unable to throw a ball even with demo, hand over hand and Max verbal cues. Patient returning to room. Upon sitting down in wc and leg rests back in place, patient abruptly standing (poor safety awareness) and stepping over foot rest (that was positioned on the R). Patient with continent void, but  required max cuing to use toilet paper and sequencing pulling up her pants and washing her hands. Patient returning to edge of bed, needs within reach, family at bedside, RN aware of patients location.   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

## 2021-02-27 DIAGNOSIS — I63512 Cerebral infarction due to unspecified occlusion or stenosis of left middle cerebral artery: Secondary | ICD-10-CM | POA: Diagnosis not present

## 2021-02-27 MED ORDER — POLYETHYLENE GLYCOL 3350 17 G PO PACK
34.0000 g | PACK | Freq: Once | ORAL | Status: AC
  Administered 2021-02-27: 13:00:00 34 g via ORAL

## 2021-02-27 MED ORDER — POLYETHYLENE GLYCOL 3350 17 G PO PACK
17.0000 g | PACK | Freq: Two times a day (BID) | ORAL | Status: DC
  Administered 2021-02-27 – 2021-03-05 (×12): 17 g via ORAL
  Filled 2021-02-27 (×12): qty 1

## 2021-02-27 MED ORDER — POLYETHYLENE GLYCOL 3350 17 G PO PACK: 17.0000 g | PACK | Freq: Every day | ORAL | Status: DC

## 2021-02-27 MED ORDER — SORBITOL 70 % SOLN: 60.0000 mL | Freq: Every day | Status: DC | PRN

## 2021-02-27 NOTE — Progress Notes (Incomplete)
Daughter Comptroller) and mother spoke with patient on the phone

## 2021-02-27 NOTE — Progress Notes (Signed)
Speech Language Pathology Daily Session Note  Patient Details  Name: [name] MRN: [id number] Date of Birth: [birth year]  Today's Date: 02/27/2021 SLP Individual Time: 1000-1100 SLP Individual Time Calculation (min): 60 min  Short Term Goals: Week 1: SLP Short Term Goal 1 (Week 1): Patient will express wants/needs via multimodal communication with max A verbal/visual cues SLP Short Term Goal 2 (Week 1): Patient will indicate preferences (yes/no, choice of 2) via pointing/gestures/head nods during 50% of occasions with max A multimodal cues SLP Short Term Goal 3 (Week 1): Patient will identify objects in field of 3 with 50% accuracy with max A verbal/visual cues SLP Short Term Goal 4 (Week 1): Pt will produce meaningful vocalizations during structured speech/language tasks with max A multimodal cues SLP Short Term Goal 5 (Week 1): Patient will consume current diet without overt s/sx of aspiration and min-to-mod A verbal cues for swallowing safety  Skilled Therapeutic Interventions: Pt received sitting EOB with her daughter at bedside and agreeable to skilled ST intervention with focus on language goals. Pt was in good spirits today, often smiling and appeared very motivated. Pt ambulated to ADL apartment for first half of session in which she required sup A verbal and visual cues for awareness of right side. SLP facilitated object identification with min A verbal/visual cues using pantry items (canned goods, boxed meals, non-perishables) in vertical field of 3 to achieve 100% accuracy. Pt consistently neglected objects on right side if field was not purposely vertical. Pt achieved 85% accuracy with min A verbal cues for identifying fruits in vertical field of 2. She achieved 50% accuracy with body part ID with mod A verbal/visual cues and benefited from visual choices. Pt responded to yes/no questions with 90% accuracy and sup A verbal cues. Pt continues to elicit "yeah" when intending to  say no but repaired with sup A with facial expressions, gestures, and/or head nods. Verbal responses (yes/no) were approximately 60% accurate. SLP facilitated counting 1-10 via gestures with max A multimodal cues. Pt executed with 0% accuracy during 1st attempt, 50% accuracy (1-5 only) during 2nd attempt, and 100% (1-10) accuracy during 3rd attempt. Pt unable to elicit automatic speech tasks this date (singing: Amazing Jeralene Peters, Claudie Fisherman) though opened her mouth on occasion. Pt exhibited slightly increased spontaneous speech this date (i.e., what, hi, oh). Daughter also endorsed increased spontaneous speech (asked "where are they?") while inquiring about where her other children were. SLP facilitated education with daughter and mom on communication strategies in which they verbalized understanding through teach back. Patient was left in bed with family present, with alarm activated, and immediate needs within reach at end of session. Continue per current plan of care.      Pain Pain Assessment Pain Scale: 0-10 Pain Score: 0-No pain  Therapy/Group: Individual Therapy  Tamala Ser 02/27/2021, 10:53 AM

## 2021-02-27 NOTE — Progress Notes (Signed)
PROGRESS NOTE   Subjective/Complaints:  Remains with severe aphasia, apraxia RIght neglect more evident at start of PT  improved with exercise  ROS: Limited by cognition/aphasia  Objective:   No results found. Recent Labs    02/24/21 0959  WBC 5.2  HGB 8.0*  HCT 27.9*  PLT 349    Recent Labs    02/24/21 0959  NA 135  K 3.8  CL 103  CO2 25  GLUCOSE 100*  BUN 13  CREATININE 1.36*  CALCIUM 8.8*     Intake/Output Summary (Last 24 hours) at 02/27/2021 0900 Last data filed at 02/27/2021 0746 Gross per 24 hour  Intake 828 ml  Output --  Net 828 ml         Physical Exam: Vital Signs Blood pressure 137/87, pulse 62, temperature 98.3 F (36.8 C), resp. rate 18, height 5\' 5"  (1.651 m), weight 109.3 kg, SpO2 100 %.  General: No acute distress Mood and affect are appropriate Heart: Regular rate and rhythm no rubs murmurs or extra sounds Lungs: Clear to auscultation, breathing unlabored, no rales or wheezes Abdomen: Positive bowel sounds, soft nontender to palpation, nondistended Extremities: No clubbing, cyanosis, or edema Skin: No evidence of breakdown, no evidence of rash    Musculoskeletal:        General: No swelling. Normal range of motion.     Cervical back: Normal range of motion.   Neurological:     Mental Status: She is alert.     Comments: Alert, makes eye contact. Expressive and receptive aphasia/apraxia.  Able to occasionally follow a simple command. +right neglect. Right central 7 and tongue deviation. RUE ?2-3/5 with apraxia and inconsistent effort. RLE grossly 4/5. Decreased sense of pain and LT RUE and RLE. No abnl tone appreciated   Assessment/Plan: 1. Functional deficits which require 3+ hours per day of interdisciplinary therapy in a comprehensive inpatient rehab setting. Physiatrist is providing close team supervision and 24 hour management of active medical problems listed  below. Physiatrist and rehab team continue to assess barriers to discharge/monitor patient progress toward functional and medical goals  Care Tool:  Bathing    Body parts bathed by patient: Right arm, Left arm, Chest, Abdomen, Front perineal area, Buttocks, Right upper leg, Left upper leg, Right lower leg, Left lower leg, Face   Body parts bathed by helper: Front perineal area, Buttocks     Bathing assist Assist Level: Moderate Assistance - Patient 50 - 74% (MOD A for motor planning and safety)     Upper Body Dressing/Undressing Upper body dressing   What is the patient wearing?: Pull over shirt    Upper body assist Assist Level: Set up assist    Lower Body Dressing/Undressing Lower body dressing      What is the patient wearing?: Underwear/pull up, Pants     Lower body assist Assist for lower body dressing: Maximal Assistance - Patient 25 - 49% (motor planning and orientation of clothes)     Toileting Toileting Toileting Activity did not occur (Clothing management and hygiene only): N/A (no void or bm)  Toileting assist Assist for toileting: Contact Guard/Touching assist     Transfers Chair/bed transfer  Transfers  assist     Chair/bed transfer assist level: Contact Guard/Touching assist     Locomotion Ambulation   Ambulation assist      Assist level: Contact Guard/Touching assist Assistive device: No Device Max distance: 269ft   Walk 10 feet activity   Assist     Assist level: Contact Guard/Touching assist Assistive device: No Device   Walk 50 feet activity   Assist    Assist level: Contact Guard/Touching assist Assistive device: No Device    Walk 150 feet activity   Assist    Assist level: Contact Guard/Touching assist Assistive device: No Device    Walk 10 feet on uneven surface  activity   Assist Walk 10 feet on uneven surfaces activity did not occur: Safety/medical concerns         Wheelchair     Assist Is the  patient using a wheelchair?: No Type of Wheelchair: Manual Wheelchair activity did not occur: N/A         Wheelchair 50 feet with 2 turns activity    Assist    Wheelchair 50 feet with 2 turns activity did not occur: N/A   Assist Level: Total Assistance - Patient < 25%   Wheelchair 150 feet activity     Assist      Assist Level: Total Assistance - Patient < 25%   Blood pressure 137/87, pulse 62, temperature 98.3 F (36.8 C), resp. rate 18, height 5\' 5"  (1.651 m), weight 109.3 kg, SpO2 100 %.  Medical Problem List and Plan: 1.  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  Antithrombotics: -DVT/anticoagulation:  Pharmaceutical: Lovenox             -antiplatelet therapy: DAPT x3 months 3. Pain Management: N/A 4. Mood: LCSW to follow for evaluation and support             -antipsychotic agents: N/A 5. Neuropsych: This patient is not capable of making decisions on her own behalf. 6. Skin/Wound Care: Routine pressure-relief measures. 7. Fluids/Electrolytes/Nutrition: Monitor intake/output.  Check  CMet in AM. 8.  HTN: Monitor blood pressures TID.              -- Continue metoprolol, lisinopril  11/12- BP's a little elevated 150/75- will con't regimen for now and monitor for trend Vitals:   02/26/21 1957 02/27/21 0540  BP: 140/75 137/87  Pulse: 63 62  Resp: 17 18  Temp: 98.1 F (36.7 C) 98.3 F (36.8 C)  SpO2: 100% 100%  Controlled 11/17 9:  Dyslipidemia: HDL- 32.  LDL 58-- > on Lipitor 80 mg 10.  Morbid obesity: BMI 41.  Educated on diet and exercise as well as weight loss to help promote overall health and mobility. 11.  Hypokalemia: Resolved with supplementation. 12.  Acute on chronic renal failure: BUN/serum 25/1.5 admission.    --Was 1.18  in 09/2020.  Monitor with serial  checks.  11/12- Cr stable- con't regimen 13.  Iron deficiency anemia: Continue iron supplement.   14.  Insomnia: Continue melatonin as well as low-dose Seroquel at bedtime  11/12- slept well   15/ Constipation  11/12- LBM 11/8- will give sorbitol this afternoon after therapy.       LOS: 6 days A FACE TO FACE EVALUATION WAS PERFORMED  [name] [place] 02/27/2021, 9:00 AM

## 2021-02-27 NOTE — Progress Notes (Signed)
Occupational Therapy Session Note  Patient Details  Name: [name] MRN: [id number] Date of Birth: Mar 03, 1976  Today's Date: 02/27/2021 OT Individual Time: 1304-1400 OT Individual Time Calculation (min): 56 min    Short Term Goals: Week 1:  OT Short Term Goal 1 (Week 1): Pt will complete LB dressing with no more than Min A OT Short Term Goal 2 (Week 1): Pt will complete UB dressing with no more than Mod A OT Short Term Goal 3 (Week 1): Pt will complete oral care while standing at the sink with supervision balance assistance  Skilled Therapeutic Interventions/Progress Updates:  Patient met semi-reclined in bed. Education provided on this writers purpose in room. No facial grimacing or indication of pain throughout session. Session with focus on sequencing familiar tasks, attention, and vision given R field deficits. Functional mobility with CGA for safety throughout session. No AD. In dayroom patient participated in number naming task but unable name numbers verbally or hold up correct number of fingers without max tactile cueing. Patient was able to follow verbal cues for grasping sticky notes with numbers from standing mirror but inconsistently chose correct number. Noted decreased attention to numbers on R side of standing mirror. Patient the participated in bowling task with Max A for sequencing and for locating bowling ball for retrieval. Functional mobility to ADL apartment. Patient able to select the correct fruit given 2 options 50% of the time. Accuracy decreased to 25% when asked to choose from selection of 3 options. Max A for path finding back to hospital room. Session concluded with patient semi-reclined in bed with call bell within reach, bed alarm activated and all needs met.   Therapy Documentation Precautions:  Precautions Precautions: Fall, Other (comment) Precaution Comments: R inattention/ neglect, apraxia, global aphasia Restrictions Weight Bearing  Restrictions: No General:    Therapy/Group: Individual Therapy  Kishana Battey R [place]Davis 02/27/2021, 2:01 PM

## 2021-02-27 NOTE — Progress Notes (Signed)
Last bowel movement was 11/12. Discussed with PA and new orders for 34g of miralax now then 17g daily

## 2021-02-27 NOTE — Progress Notes (Signed)
Physical Therapy Session Note  Patient Details  Name: [name] MRN: [id number] Date of Birth: 06/12/1975  Today's Date: 02/27/2021 PT Individual Time: 0805-0920 PT Individual Time Calculation (min): 75 min   Short Term Goals: Week 1:  PT Short Term Goal 1 (Week 1): Pt will perform all functional standing transfers with improved safety awareness and supervision/ CGA. PT Short Term Goal 2 (Week 1): Pt will demo improved functional strength with improved RLE foot clearance through 100% of amb bouts. PT Short Term Goal 3 (Week 1): Pt will demonstrate improved scanning of visual field and improve obstacle clearance to R side by 50%.  Skilled Therapeutic Interventions/Progress Updates:    Pt received sitting upright in bed with HOB elevated and agreeable to therapy session via "yes" head nod. Transitioned to sitting EOB with supervision. Sit<>stands, no AD, with close supervision throughout session. Gait in room, no AD, with close supervision/CGA for safety and to aid in navigating around environment due to severe R inattention. Pt performed hand hygiene then oral care at sink with pt requiring total cuing to motor plan and locate soap, sink faucet knobs, and then total hand-over-hand assistance to motor plan how to place toothpaste onto toothbrush with pt not recognizing purpose of toothbrush today until therapist assisted with bringing it towards her mouth. Gait training ~240ft to main therapy gym, no AD, with close supervision and therapist utilizing verbal cuing to have pt turn R and find doorway into the gym this session as opposed to tactile cuing - this was much more challenging for patient. Stair navigation ascending/descending 4 steps x3 reps with B UE support - pt starts with requiring total hand-over-hand assist to place R hand on HR and utilize it but then by 3rd rep pt placing it onto railing more automatic with only mod cuing - self-selects reciprocal stepping pattern on ascent  and step-to pattern on descent with CGA for steadying/safety. Gait training ~135ft to ADL apartment, no AD, with close supervision and again providing verbal cuing as opposed to tactile cuing to force pt to motor plan and problem solve how to turn R into the door.  Performed gait training in ADL apartment focusing on R attention and visual scanning to locate large items such as bathroom, kitchen, bedroom then to refrigerator, couch, microwave, and oven - required pt to use R UE to assist with locating items as pt often "feels" for objects in environment when searching - pt with difficulty confirming when she is in the bathroom requiring therapist to show her the shower to help her understand (there was a lot of therapy equipment in the bathroom that could have been confusing) - pt with significant difficulty finding the refrigerator in the kitchen despite this being a large object - continues to require max/total cuing throughout.  Standing performed number identification task having large circle numbers in order from 1-5 then having pt touch/grag/pickup the number called by therapist - pt with ~35% accuracy with this task and noted to pick up a number with her R hand and then forget she has it in her had and starts looking at the other numbers due to that strong R hemibody inattention.  Gait ~266ft back to room with close supervision and again providing total verbal cuing to turn R and locate her room. Pt's daughter in room and pt did not see her until she spoke - pt becomes tearful giving her daughter a hug. Therapist provides emotional support and encouragement. Pt left seated EOB with needs  in reach, bed alarm on, and her daughter present.  Therapy Documentation Precautions:  Precautions Precautions: Fall, Other (comment) Precaution Comments: R inattention/ neglect, apraxia, global aphasia Restrictions Weight Bearing Restrictions: No   Pain:  No indications of pain during  session.    Therapy/Group: Individual Therapy  Ginny Forth , PT, DPT, NCS, CSRS  02/27/2021, 7:45 AM

## 2021-02-28 DIAGNOSIS — I63512 Cerebral infarction due to unspecified occlusion or stenosis of left middle cerebral artery: Secondary | ICD-10-CM | POA: Diagnosis not present

## 2021-02-28 MED ORDER — SORBITOL 70 % SOLN
30.0000 mL | Freq: Once | Status: AC
  Administered 2021-02-28: 30 mL via ORAL
  Filled 2021-02-28: qty 30

## 2021-02-28 NOTE — Progress Notes (Signed)
PROGRESS NOTE   Subjective/Complaints:  Pt kept saying yes, even though could tell she was shaking head no- Denies constipation- Denies pain- per staff, eating OK.  Parapahsic errors with yes/no.   ROS: Limited by cognition/aphasia  Objective:   No results found. No results for input(s): WBC, HGB, HCT, PLT in the last 72 hours. No results for input(s): NA, K, CL, CO2, GLUCOSE, BUN, CREATININE, CALCIUM in the last 72 hours.  Intake/Output Summary (Last 24 hours) at 02/28/2021 0848 Last data filed at 02/28/2021 0810 Gross per 24 hour  Intake 715 ml  Output --  Net 715 ml        Physical Exam: Vital Signs Blood pressure 138/85, pulse 65, temperature 98.1 F (36.7 C), temperature source Oral, resp. rate 18, height 5\' 5"  (1.651 m), weight 109.3 kg, SpO2 100 %.   General: awake, alert, appropriate, sitting up EOB eating with supervision from NT:  NAD HENT: conjugate gaze; oropharynx moist CV: regular rate; no JVD Pulmonary: CTA B/L; no W/R/R- good air movement GI: soft, NT, ND, (+)BS Psychiatric: close to nonverbal- said yes/no only Neurological: Severe R neglect; eating with L hand- severe aphasia- expressive Musculoskeletal:        General: No swelling. Normal range of motion.     Cervical back: Normal range of motion.   Neurological:     Mental Status: She is alert.     Comments: Alert, makes eye contact. Expressive and receptive aphasia/apraxia.  Able to occasionally follow a simple command. +right neglect. Right central 7 and tongue deviation. RUE ?2-3/5 with apraxia and inconsistent effort. RLE grossly 4/5. Decreased sense of pain and LT RUE and RLE. No abnl tone appreciated   Assessment/Plan: 1. Functional deficits which require 3+ hours per day of interdisciplinary therapy in a comprehensive inpatient rehab setting. Physiatrist is providing close team supervision and 24 hour management of active medical  problems listed below. Physiatrist and rehab team continue to assess barriers to discharge/monitor patient progress toward functional and medical goals  Care Tool:  Bathing    Body parts bathed by patient: Right arm, Left arm, Chest, Abdomen, Front perineal area, Buttocks, Right upper leg, Left upper leg, Right lower leg, Left lower leg, Face   Body parts bathed by helper: Front perineal area, Buttocks     Bathing assist Assist Level: Moderate Assistance - Patient 50 - 74% (MOD A for motor planning and safety)     Upper Body Dressing/Undressing Upper body dressing   What is the patient wearing?: Pull over shirt    Upper body assist Assist Level: Set up assist    Lower Body Dressing/Undressing Lower body dressing      What is the patient wearing?: Underwear/pull up, Pants     Lower body assist Assist for lower body dressing: Maximal Assistance - Patient 25 - 49% (motor planning and orientation of clothes)     Toileting Toileting Toileting Activity did not occur (Clothing management and hygiene only): N/A (no void or bm)  Toileting assist Assist for toileting: Contact Guard/Touching assist     Transfers Chair/bed transfer  Transfers assist     Chair/bed transfer assist level: Contact Guard/Touching assist  Locomotion Ambulation   Ambulation assist      Assist level: Contact Guard/Touching assist Assistive device: No Device Max distance: 223ft   Walk 10 feet activity   Assist     Assist level: Contact Guard/Touching assist Assistive device: No Device   Walk 50 feet activity   Assist    Assist level: Contact Guard/Touching assist Assistive device: No Device    Walk 150 feet activity   Assist    Assist level: Contact Guard/Touching assist Assistive device: No Device    Walk 10 feet on uneven surface  activity   Assist Walk 10 feet on uneven surfaces activity did not occur: Safety/medical concerns          Wheelchair     Assist Is the patient using a wheelchair?: No Type of Wheelchair: Manual Wheelchair activity did not occur: N/A         Wheelchair 50 feet with 2 turns activity    Assist    Wheelchair 50 feet with 2 turns activity did not occur: N/A   Assist Level: Total Assistance - Patient < 25%   Wheelchair 150 feet activity     Assist      Assist Level: Total Assistance - Patient < 25%   Blood pressure 138/85, pulse 65, temperature 98.1 F (36.7 C), temperature source Oral, resp. rate 18, height 5\' 5"  (1.651 m), weight 109.3 kg, SpO2 100 %.  Medical Problem List and Plan: 1.  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  Antithrombotics: -DVT/anticoagulation:  Pharmaceutical: Lovenox             -antiplatelet therapy: DAPT x3 months 3. Pain Management: N/A 4. Mood: LCSW to follow for evaluation and support             -antipsychotic agents: N/A 5. Neuropsych: This patient is not capable of making decisions on her own behalf. 6. Skin/Wound Care: Routine pressure-relief measures. 7. Fluids/Electrolytes/Nutrition: Monitor intake/output.  Check  CMet in AM. 8.  HTN: Monitor blood pressures TID.              -- Continue metoprolol, lisinopril  11/12- BP's a little elevated 150/75- will con't regimen for now and monitor for trend Vitals:   02/28/21 0543 02/28/21 0621  BP: 138/85   Pulse: (!) 59 65  Resp: 18   Temp: 98.1 F (36.7 C)   SpO2: 100%    11/18- BP controlled- cont regimen 9:  Dyslipidemia: HDL- 32.  LDL 58-- > on Lipitor 80 mg 10.  Morbid obesity: BMI 41.  Educated on diet and exercise as well as weight loss to help promote overall health and mobility. 11.  Hypokalemia: Resolved with supplementation. 12.  Acute on chronic renal  failure: BUN/serum 25/1.5 admission.    --Was 1.18  in 09/2020.  Monitor with serial checks.  11/12- Cr stable- con't regimen  11/18- Last Cr 1.36- will recheck Monday.  13.  Iron deficiency anemia: Continue iron supplement.   14.  Insomnia: Continue melatonin as well as low-dose Seroquel at bedtime  11/12- slept well   15/ Constipation  11/12- LBM 11/8- will give sorbitol this afternoon after therapy.   11/18- LBM has been at least 4 days- nothing  documented- will give Sorbitol after therapy today.       LOS: 7 days A FACE TO FACE EVALUATION WAS PERFORMED  Bryant Lipps 02/28/2021, 8:48 AM

## 2021-02-28 NOTE — Progress Notes (Signed)
Speech Language Pathology Weekly Progress and Session Note  Patient Details  Name: [name] MRN: [id number] Date of Birth: Oct 23, 1975  Beginning of progress report period: February 22, 2021 End of progress report period: February 28, 2021  Today's Date: 02/28/2021 SLP Individual Time: 1450-1540 SLP Individual Time Calculation (min): 50 min  Short Term Goals: Week 1: SLP Short Term Goal 1 (Week 1): Patient will express wants/needs via multimodal communication with max A verbal/visual cues SLP Short Term Goal 1 - Progress (Week 1): Met SLP Short Term Goal 2 (Week 1): Patient will indicate preferences (yes/no, choice of 2) via pointing/gestures/head nods during 50% of occasions with max A multimodal cues SLP Short Term Goal 2 - Progress (Week 1): Met SLP Short Term Goal 3 (Week 1): Patient will identify objects in field of 3 with 50% accuracy with max A verbal/visual cues SLP Short Term Goal 3 - Progress (Week 1): Met SLP Short Term Goal 4 (Week 1): Pt will produce meaningful vocalizations during structured speech/language tasks with max A multimodal cues SLP Short Term Goal 4 - Progress (Week 1): Not met SLP Short Term Goal 5 (Week 1): Patient will consume current diet without overt s/sx of aspiration and min-to-mod A verbal cues for swallowing safety SLP Short Term Goal 5 - Progress (Week 1): Met  New Short Term Goals: Week 2: SLP Short Term Goal 1 (Week 2): STG=LTG due to ELOS  Weekly Progress Updates: Patient has made significant gains and has met 4 of 5 STGs this reporting period. Currently, patient demonstrates improved ability to communicate her functional needs through multimodal communication, spontaneous speech, receptive language skills requiring overall min-to-mod A for receptive language tasks, and max A for expressive language. She was advanced to regular textures and thin liquids and is mod I for implementing swallowing safety. Patient and family education is  ongoing. Patient would benefit from continued skilled SLP intervention to maximize language and cognitive functioning and overall functional independence prior to discharge.   Intensity: Minumum of 1-2 x/day, 30 to 90 minutes Frequency: 3 to 5 out of 7 days Duration/Length of Stay: 11/23 Treatment/Interventions: Cognitive remediation/compensation;Cueing hierarchy;Dysphagia/aspiration precaution training;Functional tasks;Multimodal communication approach;Patient/family education;Speech/Language facilitation   Daily Session Skilled Therapeutic Interventions: Skilled ST treatment focused on language and swallowing goals. SLP facilitated regular PO trials in which patient consumed with effective and timely oral prep, complete oral clearance, and without overt s/sx of aspiration. Pt also consumed single and sequential cup sips of thin liquids without s/sx of aspiration. Overall oropharyngeal swallow function appearing WFL. Recommend diet advancement to regular textures and thin liquids. Staff to continue to assist pt with self feeding. SLP also facilitated session by providing min A verbal cues for object identification in vertical field of 3 to achieve 100% accuracy. She identified objects by function in vertical field of 3 with 63% accuracy and mod A verbal and visual cues. Pt counted 1-10 via gestures x2 with 80% accuracy. SLP facilitated mirroring of oral motor movements with max A verbal/visual/tactile cues to achieve overall 75% accuracy following multiple attempts and improved efficiency with each attempt. Pt was unable to vocalize during automatic speech sequencing tasks and attempts for sustained /ah/ with max multimodal cues. She continues to exhibit improved comprehension during both structured and non-structured language tasks. Pt with continued difficulty making needs known due to expressive deficits. Patient was left in bed with alarm activated and immediate needs within reach at end of session.  Continue per current plan of care.  General    Pain    Therapy/Group: Individual Therapy  Patty Sermons 02/28/2021, 12:56 PM

## 2021-02-28 NOTE — Progress Notes (Signed)
Physical Therapy Session Note  Patient Details  Name: [name] MRN: [id number] Date of Birth: 08-Apr-1976  Today's Date: 02/28/2021 PT Individual Time: 1530-1640 PT Individual Time Calculation (min): 70 min   Short Term Goals: Week 1:  PT Short Term Goal 1 (Week 1): Pt will perform all functional standing transfers with improved safety awareness and supervision/ CGA. PT Short Term Goal 2 (Week 1): Pt will demo improved functional strength with improved RLE foot clearance through 100% of amb bouts. PT Short Term Goal 3 (Week 1): Pt will demonstrate improved scanning of visual field and improve obstacle clearance to R side by 50%.  Skilled Therapeutic Interventions/Progress Updates:   Pt received sitting EOB and agreeable to PT. Pt performed gait training through hall with supervision assist to orthogym x 254ft, and x 126ft, 161ft and 66ft noted to hit RUE on door frame and parallel bars 3 instances throughout session. Pt performed BITS visual scanning x 3 bouts. PT required to provide max cues for improve cervical rotation to the R to locate stimuli beyon midline. Improved from 18% to 26% to 32% on respective bouts. Attempted memory to press stated pictures, pt noted to stand at 45drg to the R and then only 50% accurate in pressing proper stimuli.  Pt performed object identification of animals with yes no questions. Pt able to accurately say yes/no to 11 of 12 animals. Attempted to perform color indentification with bean bag, but pt is unsuccessful. Bean bag toss then pick up from floor performed 2 x 8 with max cues for awareness of target without improved success. But able to locate all bean bags on floor with min cues for scan to the R. Pt returned to room and performed stand pivot transfer to bed with supervision A* pt left sitting EOB with call bell in reach.       Therapy Documentation Precautions:  Precautions Precautions: Fall, Other (comment) Precaution Comments: R  inattention/ neglect, apraxia, global aphasia Restrictions Weight Bearing Restrictions: No  Vital Signs: Therapy Vitals Temp: 98.2 F (36.8 C) Temp Source: Oral Pulse Rate: 65 Resp: 18 BP: (!) 153/91 Patient Position (if appropriate): Sitting Oxygen Therapy SpO2: 100 % O2 Device: Room Air Pain: denies    Therapy/Group: Individual Therapy  Golden Pop 02/28/2021, 5:16 PM

## 2021-02-28 NOTE — Plan of Care (Signed)
  Problem: RH Swallowing Goal: LTG Patient will consume least restrictive diet using compensatory strategies with assistance (SLP) Description: LTG:  Patient will consume least restrictive diet using compensatory strategies with assistance (SLP) Flowsheets (Taken 02/28/2021 1504) LTG: Pt Patient will consume least restrictive diet using compensatory strategies with assistance of (SLP): Modified Independent Note: Goal upgraded due to consistent progress

## 2021-02-28 NOTE — Progress Notes (Signed)
Physical Therapy Session Note  Patient Details  Name: [name] MRN: [id number] Date of Birth: 05-21-1975  Today's Date: 02/28/2021 PT Individual Time: 0900-0926 PT Individual Time Calculation (min): 26 min   Short Term Goals: Week 1:  PT Short Term Goal 1 (Week 1): Pt will perform all functional standing transfers with improved safety awareness and supervision/ CGA. PT Short Term Goal 2 (Week 1): Pt will demo improved functional strength with improved RLE foot clearance through 100% of amb bouts. PT Short Term Goal 3 (Week 1): Pt will demonstrate improved scanning of visual field and improve obstacle clearance to R side by 50%.  Skilled Therapeutic Interventions/Progress Updates:    Patient received sitting edge of bed agreeable to PT. She was able to vocalize "no" and shake head no when asked if she had pain. Patient did appear as though she had been crying- family present and confirms that patient has been more frustrated today. Patient appearing to be trying to communicate more with family and becoming frustrated at her limited ability to do so. She was able to ambulate >331ft with grossly close supervision and no additional cues to avoid obstacles on the R, but did require Total cuing to scan R to make R turns and retrieve items from R side. Patient attempting to complete block retrieval using Box and Blocks test set. She was able to follow cues "pick up a block and put it on the other side" when using the L hand, but was unable to do so using R hand despite TotalA multimodal cues and hand over hand assist. Patient turning herself to face R side of box, as opposed to facing it straight on, to seemingly use L field of vision to see blocks. Patient remaining unable to motor plan tossing a ball to PT, but was able to complete automatic bimanual task of picking up a tissue and blowing her nose and throwing out the tissue all with her R hand. Patient also able to follow cue "when we get  to the exit sign, turn around" and was able to complete task without additional cuing. Patient returning to room, sitting edge of bed, family present, needs within reach.   Therapy Documentation Precautions:  Precautions Precautions: Fall, Other (comment) Precaution Comments: R inattention/ neglect, apraxia, global aphasia Restrictions Weight Bearing Restrictions: No    Therapy/Group: Individual Therapy  Elizebeth Koller, PT, DPT, CBIS  02/28/2021, 7:41 AM

## 2021-02-28 NOTE — Progress Notes (Signed)
Occupational Therapy Weekly Progress Note  Patient Details  Name: Delcia Marna Weniger MRN: [id number] Date of Birth: 08/26/1975  Beginning of progress report period: February 22, 2021 End of progress report period: February 28, 2021  Today's Date: 02/28/2021 OT Individual Time: 4235-3614 OT Individual Time Calculation (min): 57 min    Patient has met 3 of 3 short term goals.  Ms. Schaad is making steady progress with OT at this time.  She is able to complete bathing with overall supervision in standing but needs mod demonstrational cueing at times for sequencing and motor planning.  She is unable to manipulate the hand held shower secondary to motor planning deficits and needs assistance with rinsing, unless the shower head can be fixed above her as she has at home, and  then she can stand and turn to rinse as needed.  She needs mod demonstrational cueing to apply soap to her washcloth as she will hold items such as the soap in the right hand without any awareness that she is holding something.  Therapist has to physically lift her hand up so she can see it in order for her to take it with the left hand.  On some occasions however she will spontaneously use BUEs to complete task such as when dressing, which can be completed at min guard assist level.  Ms. Ferran demonstrates difficulty with expressive deficits as well as receptive.  Decreased ability to follow multi step verbal commands with severe motor planning deficits as well.  She needs max assist for setup of toothbrush but once this is completed, she can brush her teeth with supervision but tends to perseverate, demonstrating difficulty terminating the task.  Transfers are at a supervision level as well as functional mobility without the use of an assistive device.  She appears to have a right visual field deficit as well as neglect, requiring max demonstrational cueing to scan to the right to locate any object, and as previously stated to  even look at her right hand.  Family has been in for some sessions and education is ongoing in preparation for her discharge next week 11/23.  Recommend continued CIR therapy to progress to supervision level goals.   Patient continues to demonstrate the following deficits: muscle weakness, decreased awareness and decreased problem solving, and decreased standing balance and decreased balance strategies and therefore will continue to benefit from skilled OT intervention to enhance overall performance with BADL, iADL, and Reduce care partner burden.  Patient progressing toward long term goals..  Continue plan of care.  OT Short Term Goals Week 2:  OT Short Term Goal 1 (Week 2): Continue working on established supervision level goals.  Skilled Therapeutic Interventions/Progress Updates:    Pt sitting EOB with family present to begin session.  She was able to work on Corporate treasurer level.  Her family retrieved her clothing for dressing once she completed bathing.  She was able to ambulate to the shower at supervision level and remove all dirty clothing at the same level.  She needed assist with setting up the water to the correct temperature and then completed bathing at overall supervision level.  She stood throughout with therapist helping to maintain shower head up at regular height while she showered.  Mod demonstrational cueing for sequencing and for application of soap on the washcloth because otherwise, she just held the soap in her right hand without any awareness of it.  She tended to perseverate on washing her body over and over, requiring  mod instructional cueing for completion.  Once finished, she stood and dried off and then ambulated out to the bed for application of lotion, deodorant, and donning clothing.  Setup for deodorant and lotion as well as supervision for donning clothing.  She needed max instructional cueing for scanning right of midline for locating her clothing and her  lotion.  Once dressing was complete, she stood at the sink for oral hygiene.  Max assist for setup of toothbrush with toothpaste as initially, she tried to put the toothpaste on her finger.  Once she was setup, she was able to brush her teeth using the RUE.  Next, she ambulated down to the therapy apartment where she worked on putting the comforter on the bed.  When presented with the comforter being placed on the bed, she was then able to arrange it appropriately.  Finally, presented her with two pieces of plastic fruit and asked her to pick up a certain one.  She was correct on 50% of trials.  Finished session with return to the room at supervision level with mod demonstrational cueing for turns to the right.  She was left in the bed with the call button and phone in reach and safety alarm in place.    Therapy Documentation Precautions:  Precautions Precautions: Fall, Other (comment) Precaution Comments: R inattention/ neglect, apraxia, global aphasia Restrictions Weight Bearing Restrictions: No   Pain: Pain Assessment Pain Scale: Faces Pain Score: 0-No pain ADL: See Care Tool Section for some details of mobility and selfcare   Therapy/Group: Individual Therapy  Camelle Henkels OTR/L 02/28/2021, 4:04 PM

## 2021-03-01 NOTE — Progress Notes (Signed)
Physical Therapy Weekly Progress Note  Patient Details  Name: [name] MRN: [id number] Date of Birth: 07/12/1975  Beginning of progress report period: February 22, 2021 End of progress report period: March 01, 2021  Today's Date: 03/01/2021 PT Individual Time: 2505-3976 PT Individual Time Calculation (min): 64 min   Patient has met 2 of 3 short term goals. [name] is progressing well with therapy demonstrating increasing independence with functional mobility; however, continues to be significantly impacted by her R inattention, impaired vision, apraxia, and global aphasia. Although she is functionally able to move at an overall supervision/CGA level, due to these impairments she requires total assist multimodal cuing to perform very basic self-care tasks resulting in a large burden of care on family. Pt will benefit from continued CIR level therapies to further address these impairments functionally prior to anticipated D/C on 03/05/21 home with 24hr support from family.   Patient continues to demonstrate the following deficits muscle weakness, decreased cardiorespiratoy endurance, motor apraxia and decreased motor planning, decreased visual acuity, decreased visual perceptual skills, and decreased visual motor skills, right side neglect and ideational apraxia, decreased initiation, decreased attention, decreased awareness, decreased problem solving, decreased safety awareness, and delayed processing, and decreased standing balance and decreased balance strategies and therefore will continue to benefit from skilled PT intervention to increase functional independence with mobility.  Patient progressing toward long term goals..  Continue plan of care.  PT Short Term Goals Week 1:  PT Short Term Goal 1 (Week 1): Pt will perform all functional standing transfers with improved safety awareness and supervision/ CGA. PT Short Term Goal 1 - Progress (Week 1): Met PT Short Term Goal 2  (Week 1): Pt will demo improved functional strength with improved RLE foot clearance through 100% of amb bouts. PT Short Term Goal 2 - Progress (Week 1): Met PT Short Term Goal 3 (Week 1): Pt will demonstrate improved scanning of visual field and improve obstacle clearance to R side by 50%. PT Short Term Goal 3 - Progress (Week 1): Progressing toward goal Week 2:  PT Short Term Goal 1 (Week 2): = to LTGs based on ELOS  Skilled Therapeutic Interventions/Progress Updates:  Ambulation/gait training;Community reintegration;DME/adaptive equipment instruction;Neuromuscular re-education;Psychosocial support;Stair training;UE/LE Strength taining/ROM;UE/LE Coordination activities;Therapeutic Activities;Skin care/wound management;Pain management;Functional electrical stimulation;Discharge planning;Balance/vestibular training;Wheelchair propulsion/positioning;Visual/perceptual remediation/compensation;Splinting/orthotics;Therapeutic Exercise;Patient/family education;Functional mobility training;Disease management/prevention;Cognitive remediation/compensation   Pt received sitting EOB with NT present and pt attempting to communicate something with him, but unable. Pt agreeable to therapy session. Sit<>stands, no AD, supervision throughout session. Gait ~124f to ADL apartment, ~1253fto ortho gym, and ~30013fack to room all without AD and close supervision - pt continues to require max/total verbal cuing to turn R, visually scan, and locate doorways/hallways on that side while navigating to these areas.   In ADL apartment, focused on reiterating previous task of ambulating in kitchen to find large items such as: oven/stove, microwave, refrigerator, freezer, toaster, and pantry when verbally stated by therapist - pt continues to have the most difficulty with the fridge (due to color and size looking like a wall?) and easiest time finding the microwave (more color contrast on the object and environment) - pt overall  requires slightly less verbal cuing compared to in previous sessions to locate these items, allowing her to advance to the below progression. Pt continues to "search" environment with her hand - is able to visually scan and feel with her L hand simultaneously, but when requiring pt to only use R hand notice she  has an extremely hard time visually finding her R hand and the object she is feeling of with it. Continues to have severe R neglect and an odd visual presentation as it appears pt will visually find the object but the second she goes to reach for it, it is like she looses sight of it.   Therapist provided pt with 2 "frozen" pizzas and a "frozen" dinner meal and handed it to pt's R hand and requested to she find the freezer and put them away - while searching for freezer pt often forgets she has the food in her R hand resulting in her opening freezer with L hand and looking inside of it requiring total hand-over-hand multimodal cuing to locate item in R hand - pt also with significant difficulty finding the freezer door on her R side to close it requiring max cuing.  Pt then able to collect all of the items out of the freezer (even seeing an item in the freezer door on the R side) and carry them to the pantry, which is on the R side, with no great difficulty; however, when going to place the items back in the pantry she does not see the open space on R side of cabinet shelf therefore piling them on top of items on L side of shelf.   With gesturing, pt able to communicate she wants to do the BITS machine again. Continues to require max/total cues to locate it on the R side of the room.   Performed visual scanning task on BITS to locate large letters A-E progressed to A-H with stylus in R hand, therapist biasing R side of the screen for R attention - pt requires max/total hand-over-hand assistance to find letters on R side of screen. Pt often closes her L eye and turns her head to the R causing her R eye  to move medially (towards her nose) to get the letter into focus.  Upon return to room, her mother and daughter are there - realize this is what pt was trying to ask NT about earlier, is when her family would be here. Pt left seated EOB with needs in reach, family present, and bed alarm on.   Therapy Documentation Precautions:  Precautions Precautions: Fall, Other (comment) Precaution Comments: R inattention/ neglect, apraxia, global aphasia Restrictions Weight Bearing Restrictions: No   Pain:  No indications of pain throughout session.  Therapy/Group: Individual Therapy  Tawana Scale , PT, DPT, NCS, CSRS  03/01/2021, 3:29 PM

## 2021-03-01 NOTE — Progress Notes (Signed)
Occupational Therapy Session Note  Patient Details  Name: [name] MRN: [id number] Date of Birth: 1975/12/21  Today's Date: 03/01/2021 OT Individual Time: 1030-1145 OT Individual Time Calculation (min): 75 min    Short Term Goals: Week 1:  OT Short Term Goal 1 (Week 1): Pt will complete LB dressing with no more than Min A OT Short Term Goal 1 - Progress (Week 1): Met OT Short Term Goal 2 (Week 1): Pt will complete UB dressing with no more than Mod A OT Short Term Goal 2 - Progress (Week 1): Met OT Short Term Goal 3 (Week 1): Pt will complete oral care while standing at the sink with supervision balance assistance OT Short Term Goal 3 - Progress (Week 1): Met  Skilled Therapeutic Interventions/Progress Updates:  patient completed bathing earlier and focus this session on tasks utilizing R visual  attention as well as comprehension and expressive communication opportunities as well as tasks incorporating both sides of the brain.   Pat with difficulty visual acuity and perception as well as right UE coordination.  She required much tactile or physical cueing as well as hand over hand assist to complee bilateral and right UE coordination and motor planning task. At end of session patient was left seated edge of bed with alrm engaged just as she was seated there upon approach for OT.    Continue POC     Therapy Documentation Precautions:  Precautions Precautions: Fall, Other (comment) Precaution Comments: R inattention/ neglect, apraxia, global aphasia Restrictions Weight Bearing Restrictions: No General:   Vital Signs: Therapy Vitals Temp: 98.1 F (36.7 C) Temp Source: Oral Pulse Rate: 60 Resp: 16 BP: (!) 148/90 Patient Position (if appropriate): Sitting Oxygen Therapy SpO2: 100 % O2 Device: Room Air Pain:denied    Therapy/Group: Individual Therapy  Alfredia Ferguson [facility] 03/01/2021, 4:28 PM

## 2021-03-02 DIAGNOSIS — I10 Essential (primary) hypertension: Secondary | ICD-10-CM

## 2021-03-02 DIAGNOSIS — I63512 Cerebral infarction due to unspecified occlusion or stenosis of left middle cerebral artery: Secondary | ICD-10-CM | POA: Diagnosis not present

## 2021-03-02 DIAGNOSIS — D509 Iron deficiency anemia, unspecified: Secondary | ICD-10-CM

## 2021-03-02 DIAGNOSIS — K5901 Slow transit constipation: Secondary | ICD-10-CM

## 2021-03-02 NOTE — Progress Notes (Signed)
Nurse spoke with daughter, updated on pt status.

## 2021-03-02 NOTE — Progress Notes (Addendum)
PROGRESS NOTE   Subjective/Complaints: Patient seen laying in bed this AM.  She indicates the slept well overnight.  She denies complaints.   ROS: Limited by cognition  Objective:   No results found. No results for input(s): WBC, HGB, HCT, PLT in the last 72 hours. No results for input(s): NA, K, CL, CO2, GLUCOSE, BUN, CREATININE, CALCIUM in the last 72 hours.  Intake/Output Summary (Last 24 hours) at 03/02/2021 1549 Last data filed at 03/02/2021 1300 Gross per 24 hour  Intake 598 ml  Output --  Net 598 ml         Physical Exam: Vital Signs Blood pressure 139/84, pulse 68, temperature 98.4 F (36.9 C), temperature source Oral, resp. rate 16, height 5\' 5"  (1.651 m), weight 109.3 kg, SpO2 100 %. Constitutional: No distress . Vital signs reviewed. Obese. HENT: Normocephalic.  Atraumatic. Eyes: EOMI. No discharge. Cardiovascular: No JVD.  RRR. Respiratory: Normal effort.  No stridor.  Bilateral clear to auscultation. GI: Non-distended.  BS +. Skin: Warm and dry.  Intact. Psych: Normal mood.  Normal behavior. Musc: No edema in extremities.  No tenderness in extremities. Neuro: Alert Right inattention Aphasia Motor: RUE 3/5 with apraxia and inconsistent RLE grossly 4/5.   Assessment/Plan: 1. Functional deficits which require 3+ hours per day of interdisciplinary therapy in a comprehensive inpatient rehab setting. Physiatrist is providing close team supervision and 24 hour management of active medical problems listed below. Physiatrist and rehab team continue to assess barriers to discharge/monitor patient progress toward functional and medical goals  Care Tool:  Bathing    Body parts bathed by patient: Right arm, Left arm, Chest, Abdomen, Front perineal area, Buttocks, Right upper leg, Left upper leg, Right lower leg, Left lower leg, Face   Body parts bathed by helper: Front perineal area, Buttocks     Bathing  assist Assist Level: Supervision/Verbal cueing     Upper Body Dressing/Undressing Upper body dressing   What is the patient wearing?: Pull over shirt    Upper body assist Assist Level: Set up assist    Lower Body Dressing/Undressing Lower body dressing      What is the patient wearing?: Underwear/pull up, Pants     Lower body assist Assist for lower body dressing: Supervision/Verbal cueing     Toileting Toileting Toileting Activity did not occur (Clothing management and hygiene only): N/A (no void or bm)  Toileting assist Assist for toileting: Contact Guard/Touching assist     Transfers Chair/bed transfer  Transfers assist     Chair/bed transfer assist level: Supervision/Verbal cueing     Locomotion Ambulation   Ambulation assist      Assist level: Supervision/Verbal cueing Assistive device: No Device Max distance: 289ft   Walk 10 feet activity   Assist     Assist level: Contact Guard/Touching assist Assistive device: No Device   Walk 50 feet activity   Assist    Assist level: Contact Guard/Touching assist Assistive device: No Device    Walk 150 feet activity   Assist    Assist level: Contact Guard/Touching assist Assistive device: No Device    Walk 10 feet on uneven surface  activity   Assist Walk 10  feet on uneven surfaces activity did not occur: Safety/medical concerns         Wheelchair     Assist Is the patient using a wheelchair?: No Type of Wheelchair: Manual Wheelchair activity did not occur: N/A         Wheelchair 50 feet with 2 turns activity    Assist    Wheelchair 50 feet with 2 turns activity did not occur: N/A   Assist Level: Total Assistance - Patient < 25%   Wheelchair 150 feet activity     Assist      Assist Level: Total Assistance - Patient < 25%   Blood pressure 139/84, pulse 68, temperature 98.4 F (36.9 C), temperature source Oral, resp. rate 16, height 5\' 5"  (1.651 m), weight  109.3 kg, SpO2 100 %.  Medical Problem List and Plan: 1.  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  Antithrombotics: -DVT/anticoagulation:  Pharmaceutical: Lovenox             -antiplatelet therapy: DAPT x3 months 3. Pain Management: N/A 4. Mood: LCSW to follow for evaluation and support             -antipsychotic agents: N/A 5. Neuropsych: This patient is not capable of making decisions on her own behalf. 6. Skin/Wound Care: Routine pressure-relief measures. 7. Fluids/Electrolytes/Nutrition: Monitor intake/outputs 8.  HTN: Monitor blood pressures TID.              -- Continue metoprolol, lisinopril  Vitals:   03/02/21 0627 03/02/21 1350  BP: (!) 155/91 139/84  Pulse: 60 68  Resp:  16  Temp:  98.4 F (36.9 C)  SpO2:  100%   ? Improving on 11/20, monitor for trend 9:  Dyslipidemia: HDL- 32.  LDL 58-- > on Lipitor 80 mg 10.  Morbid obesity: BMI 41.  Educated on diet and exercise as well as weight loss to help promote overall health and mobility. 11.  Hypokalemia: Resolved with supplementation. 12.  Acute on chronic renal failure:  Monitor with serial checks. Cr 1.36 on 11/14, labs ordered for tomorrow 13.  Iron deficiency anemia: Continue iron supplement.    Hb 8.0 on 11/14, labs ordered for tomorrow 14.  Sleep disturbance: Continue melatonin as well as low-dose Seroquel at bedtime  11/12- slept well  15/ Constipation  Improving  LOS: 9 days A FACE TO FACE EVALUATION WAS PERFORMED  [name] 13/12 03/02/2021, 3:49 PM

## 2021-03-03 DIAGNOSIS — I63512 Cerebral infarction due to unspecified occlusion or stenosis of left middle cerebral artery: Secondary | ICD-10-CM | POA: Diagnosis not present

## 2021-03-03 LAB — BASIC METABOLIC PANEL
Anion gap: 8 (ref 5–15)
BUN: 13 mg/dL (ref 6–20)
CO2: 26 mmol/L (ref 22–32)
Calcium: 8.7 mg/dL — ABNORMAL LOW (ref 8.9–10.3)
Chloride: 102 mmol/L (ref 98–111)
Creatinine, Ser: 1.08 mg/dL — ABNORMAL HIGH (ref 0.44–1.00)
GFR, Estimated: >60 mL/min (ref >60)
Glucose, Bld: 92 mg/dL (ref 70–99)
Potassium: 3.9 mmol/L (ref 3.5–5.1)
Sodium: 136 mmol/L (ref 135–145)

## 2021-03-03 LAB — CBC
HCT: 29.5 % — ABNORMAL LOW (ref 36.0–46.0)
Hemoglobin: 8.4 g/dL — ABNORMAL LOW (ref 12.0–15.0)
MCH: 20.8 pg — ABNORMAL LOW (ref 26.0–34.0)
MCHC: 28.5 g/dL — ABNORMAL LOW (ref 30.0–36.0)
MCV: 73.2 fL — ABNORMAL LOW (ref 80.0–100.0)
Platelets: 365 10*3/uL (ref 150–400)
RBC: 4.03 MIL/uL (ref 3.87–5.11)
RDW: 23.6 % — ABNORMAL HIGH (ref 11.5–15.5)
WBC: 5.2 10*3/uL (ref 4.0–10.5)
nRBC: 0 % (ref 0.0–0.2)

## 2021-03-03 NOTE — Progress Notes (Signed)
Speech Language Pathology Daily Session Note  Patient Details  Name: [name] MRN: [id number] Date of Birth: 28-Apr-1975  Today's Date: 03/03/2021 SLP Individual Time: 1015-1100 SLP Individual Time Calculation (min): 45 min  Short Term Goals: Week 2: SLP Short Term Goal 1 (Week 2): STG=LTG due to ELOS  Skilled Therapeutic Interventions: Skilled ST treatment focused on family education with pt's daughter and mother. Pt will be discharging to her mother's home with 24 hour supervision. SLP provided education on progress toward language goals, continued areas of difficulty, and communication strategies to support functional communication/comprehension. Discussed possible uses for picture based communication supports at home. SLP assessed stimulability for picture based communication system by identifying field of 2 pictures in which pt achieved 50% accuracy. Daughter expressed interest in making a flip-book style communication aid at home. SLP provided education on effective uses and considerations such as using large pictures and not relying solely on text due to reading deficits, and using 1-2 vertical pictures at a time due to ongoing right visual inattention. SLP also educated on recommendations for continued speech therapy at next level of care. Both daughter and mother verbalized understanding of today's education topics through teach back and demonstration. Patient was left in bed with alarm activated and immediate needs within reach at end of session. Continue per current plan of care.      Pain  No pain  Therapy/Group: Individual Therapy  Tamala Ser 03/03/2021, 10:54 AM

## 2021-03-03 NOTE — Progress Notes (Signed)
Physical Therapy Session Note  Patient Details  Name: [name] MRN: [id number] Date of Birth: Dec 05, 1975  Today's Date: 03/03/2021 PT Individual Time: 1100-1155 PT Individual Time Calculation (min): 55 min   Short Term Goals: Week 2:  PT Short Term Goal 1 (Week 2): = to LTGs based on ELOS  Skilled Therapeutic Interventions/Progress Updates:    Patient received sitting EOB, family present, agreeable to PT. She did not indicate any pain. Patient ambulating with supervision to therapy gym. Dtr attending session for family education. PT explainign to dtr to stand on R side to assist patient in avoiding obstacles on R and staying within arms distance incase she had a LOB. Patient negotiating 14 steps with L HR and light CGA.Self-selecting reciprocal stepping. PT did have to guide R hand to railing when descending, but patient able to automatically grab railing in subsequent trials. PT educating family on standing behind patient when she ascends and in front of patient when she descends. Patient able to transfer in/out of car with light CGA. No additional assist for cuing needed for motor planning transfer. Patient identifying fruit, handing PT correct fruit out of 2, progressing to 3 options with 75% accuracy. Patient remains needing to pick up object and appear to process it before handing it to PT. She has improved her ability to engage her R UE and would pass the fruit between her hands, but if her attention was pulled elsewhere, she would forget about fruit in R UE and require TotalA cuing to hand that fruit back to PT. Patient able to identify her name out of a list of 5 options. She was able to complete color ID task where she would hold pegs of 2 different colors and handed PT correct peg 50% of the time. Patient remains with significant difficulty producing automatic speech (counting to 10, singing the ABCs). Patient required TotalA cuing to find her way back to her room- she continues  to have significant difficulty compelting R turns despite her ability to scan R. Patient returning to sitting EOB, family present, all needs within reach.   Therapy Documentation Precautions:  Precautions Precautions: Fall, Other (comment) Precaution Comments: R inattention/ neglect, apraxia, global aphasia Restrictions Weight Bearing Restrictions: No    Therapy/Group: Individual Therapy  Elizebeth Koller, PT, DPT, CBIS  03/03/2021, 7:34 AM

## 2021-03-03 NOTE — Progress Notes (Signed)
Speech Language Pathology Discharge Summary  Patient Details  Name: [name] MRN: [id number] Date of Birth: 1975-05-22  Today's Date: 03/04/2021 SLP Individual Time: 1400-1445 SLP Individual Time Calculation (min): 45 min  Skilled Therapeutic Interventions: Skilled ST treatment focused on communication goals. SLP facilitated session by providing min A verbal cues for picture identification in vertical field of 3 with 83% accuracy, picture identification in vertical field of 3 based on function/category with 80% accuracy, and number identification in vertical field of 3 with 70% accuracy. Pt effectively communicated her needs this date through gestures and by responding to yes/no questions with mod A verbal cues to indicate she needed tissues, bathroom, and something to drink. Patient was left in bed with alarm activated and immediate needs within reach at end of session.   Patient has met 4 of 4 long term goals.  Patient to discharge at overall Mod level.  Reasons goals not met: N/A   Clinical Impression/Discharge Summary: Patient has made significant gains and has met 4 of 4 long-term goals this admission due to improved receptive language skills, ability to communicate/respond to her needs via multimodal communication, gestures, and answering yes/no questions. Patient is currently an overall min-to-mod A for comprehension skills, and max-to-total A for verbal expression. Pt most consistent with communicating "yes" and "no" and utilizing gestures to communicate her needs at this time. Her spontaneous speech is slowly improving. Patient is currently consuming a regular texture diet and thin liquids and is independent with implementing safe swallow techniques. Patient and family education is complete and patient to discharge at overall mod-to-max A level for communication. Patient's care partner is independent to provide the necessary physical and cognitive assistance at discharge. Patient  would benefit from continued SLP services to maximize functional communication, cognition, and independence.    Care Partner:  Caregiver Able to Provide Assistance: Yes  Type of Caregiver Assistance: Cognitive;Physical  Recommendation:  24 hour supervision/assistance;Home Health SLP;Outpatient SLP  Rationale for SLP Follow Up: Maximize functional communication;Maximize cognitive function and independence;Reduce caregiver burden   Equipment: N/A   Reasons for discharge: Discharged from hospital;Treatment goals met   Patient/Family Agrees with Progress Made and Goals Achieved: Yes    Oscar Forman T [name] 03/03/2021, 7:56 AM

## 2021-03-03 NOTE — Progress Notes (Signed)
Occupational Therapy Session Note  Patient Details  Name: [name] MRN: [id number] Date of Birth: 07-23-1975  Today's Date: 03/03/2021 OT Individual Time: 4174-0814 OT Individual Time Calculation (min): 53 min    Short Term Goals: Week 2:  OT Short Term Goal 1 (Week 2): Continue working on established supervision level goals.  Skilled Therapeutic Interventions/Progress Updates:    Session 1: [id number])  Pt worked on bathing and dressing during session with her mother and her daughter present.  She was able to gather clothing from the dresser prior to transfer to the shower.  She was able to ambulate to the shower with supervision and then undress in standing with UE support on the grab bar.  She was able to complete bathing with min instructional cueing and supervision in standing.  She was able to integrate use of the RUE for washing spontaneously as well as when applying soap.  She was able to then transfer out to the EOB for application of lotion and donning clothing.   Mod demonstrational cueing needed to scan right of midline for locating items placed on the bed for dressing.  Min assist was needed for sequencing donning her pants, but she was able to donn her underpants with setup.  She donned her pullover shirt with supervision, but had it oriented backwards to start and had to doff it and then re-donn it.  She was able to then donn her slip on shoes with setup.  Next, had her complete oral hygiene at the sink with supervision, but needed max hand over hand for setup of toothbrush with toothpaste.  She was then able to brush her teeth with the LUE.  When therapist tried to provide hand over hand to use the RUE to brush her teeth, she was unable to initiate it.  When the toothbrush was switched to the left, she was able to complete brushing her teeth.  Therapist presented the cup with water in it for rinsing with verbal cueing.  Next, had her ambulate down to the tub room with  supervision and no assistive device.  She completed step in transfer with supervision using the wall for support.  Returned to the room at the end of the session with mod demonstrational cueing for all right hand turns.  All questions answered at end of session.     Session 2: ([id number])  Pt in bed to start session with transfer to sitting with independence.  Had her walk down to the ortho gym with supervision overall and mod demonstrational cueing for turning to the right.  Had her work on Tax adviser using the [employer].  She exhibited significant motor planning deficits when attempting task.  Instead of isolating her index finger to press the blue dots, she would try and use multiple fingers at the same time.  Therapist had to provide max demonstrational cueing for scanning to the right of the board with therapist turning her head at times.  She was only able to complete in an average of almost 9 seconds reaction time with only 31/% accuracy for a 2 min set.  On the second set, her her use the stylus and she still exhibited significant difficulty finding and pressing the blue dots.  Still needing max demonstrational cueing to complete with only 38% accuracy and 7 seconds reaction time.  Had her try and locate her room from the therapy gym.  Mod demonstrational cueing for all turns and she was not able to recognize her room when  she passed it.  Finished session with pt in the bed and with the safety alarm in place and with the call button and phone in reach.    Therapy Documentation Precautions:  Precautions Precautions: Fall, Other (comment) Precaution Comments: R inattention/ neglect, apraxia, global aphasia Restrictions Weight Bearing Restrictions: No  Pain: Pain Assessment Pain Scale: Faces Pain Score: 0-No pain Faces Pain Scale: No hurt ADL: See Care Tool Section for some details of mobility and selfcare   Therapy/Group: Individual Therapy  Codie Krogh OTR/L 03/03/2021, 12:10  PM

## 2021-03-03 NOTE — Progress Notes (Signed)
PROGRESS NOTE   Subjective/Complaints:  Remains aphasic , no other issues , family  (mom and daughter ) at bedside , mom asking about disability application   ROS: Limited by cognition  Objective:   No results found. Recent Labs    03/03/21 0528  WBC 5.2  HGB 8.4*  HCT 29.5*  PLT 365   Recent Labs    03/03/21 0528  NA 136  K 3.9  CL 102  CO2 26  GLUCOSE 92  BUN 13  CREATININE 1.08*  CALCIUM 8.7*    Intake/Output Summary (Last 24 hours) at 03/03/2021 1024 Last data filed at 03/02/2021 1802 Gross per 24 hour  Intake 477 ml  Output --  Net 477 ml         Physical Exam: Vital Signs Blood pressure (!) 154/87, pulse 63, temperature 98.4 F (36.9 C), temperature source Oral, resp. rate 18, height 5\' 5"  (1.651 m), weight 109.3 kg, SpO2 99 %.  General: No acute distress Mood and affect are appropriate Heart: Regular rate and rhythm no rubs murmurs or extra sounds Lungs: Clear to auscultation, breathing unlabored, no rales or wheezes Abdomen: Positive bowel sounds, soft nontender to palpation, nondistended Extremities: No clubbing, cyanosis, or edema Skin: No evidence of breakdown, no evidence of rash   Neuro: Alert Right inattention Aphasia Motor: RUE 3/5 with apraxia and inconsistent RLE grossly 4/5.   Assessment/Plan: 1. Functional deficits which require 3+ hours per day of interdisciplinary therapy in a comprehensive inpatient rehab setting. Physiatrist is providing close team supervision and 24 hour management of active medical problems listed below. Physiatrist and rehab team continue to assess barriers to discharge/monitor patient progress toward functional and medical goals  Care Tool:  Bathing    Body parts bathed by patient: Right arm, Left arm, Chest, Abdomen, Front perineal area, Buttocks, Right upper leg, Left upper leg, Right lower leg, Left lower leg, Face   Body parts bathed by  helper: Front perineal area, Buttocks     Bathing assist Assist Level: Supervision/Verbal cueing     Upper Body Dressing/Undressing Upper body dressing   What is the patient wearing?: Pull over shirt    Upper body assist Assist Level: Set up assist    Lower Body Dressing/Undressing Lower body dressing      What is the patient wearing?: Underwear/pull up, Pants     Lower body assist Assist for lower body dressing: Supervision/Verbal cueing     Toileting Toileting Toileting Activity did not occur (Clothing management and hygiene only): N/A (no void or bm)  Toileting assist Assist for toileting: Contact Guard/Touching assist     Transfers Chair/bed transfer  Transfers assist     Chair/bed transfer assist level: Supervision/Verbal cueing     Locomotion Ambulation   Ambulation assist      Assist level: Supervision/Verbal cueing Assistive device: No Device Max distance: 242ft   Walk 10 feet activity   Assist     Assist level: Contact Guard/Touching assist Assistive device: No Device   Walk 50 feet activity   Assist    Assist level: Contact Guard/Touching assist Assistive device: No Device    Walk 150 feet activity   Assist  Assist level: Contact Guard/Touching assist Assistive device: No Device    Walk 10 feet on uneven surface  activity   Assist Walk 10 feet on uneven surfaces activity did not occur: Safety/medical concerns         Wheelchair     Assist Is the patient using a wheelchair?: No Type of Wheelchair: Manual Wheelchair activity did not occur: N/A         Wheelchair 50 feet with 2 turns activity    Assist    Wheelchair 50 feet with 2 turns activity did not occur: N/A   Assist Level: Total Assistance - Patient < 25%   Wheelchair 150 feet activity     Assist      Assist Level: Total Assistance - Patient < 25%   Blood pressure (!) 154/87, pulse 63, temperature 98.4 F (36.9 C), temperature  source Oral, resp. rate 18, height 5\' 5"  (1.651 m), weight 109.3 kg, SpO2 99 %.  Medical Problem List and Plan: 1.  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  Antithrombotics: -DVT/anticoagulation:  Pharmaceutical: Lovenox             -antiplatelet therapy: DAPT x3 months 3. Pain Management: N/A 4. Mood: LCSW to follow for evaluation and support             -antipsychotic agents: N/A 5. Neuropsych: This patient is not capable of making decisions on her own behalf. 6. Skin/Wound Care: Routine pressure-relief measures. 7. Fluids/Electrolytes/Nutrition: Monitor intake/outputs 8.  HTN: Monitor blood pressures TID.              -- Continue metoprolol, lisinopril and norvasc on home doses   Vitals:   03/02/21 1955 03/03/21 0335  BP: (!) 141/77 (!) 154/87  Pulse: 68 63  Resp: 18 18  Temp: 98.3 F (36.8 C) 98.4 F (36.9 C)  SpO2: 100% 99%   HR in 60s , sys BP mild-mod elevation  9:  Dyslipidemia: HDL- 32.  LDL 58-- > on Lipitor 80 mg 10.  Morbid obesity: BMI 41.  Educated on diet and exercise as well as weight loss to help promote overall health and mobility. 11.  Hypokalemia: Resolved with supplementation. 12.  Acute on chronic renal failure:  Monitor with serial checks. Cr 1.36 on 11/14, improved 1.08 on 11/21 13.  Iron deficiency anemia: Continue iron supplement.    Hb 8.0 on 11/14, stable 8.4 on 11/21 14.  Sleep disturbance: Continue melatonin as well as low-dose Seroquel at bedtime  11/12- slept well  15/ Constipation  Improving  LOS: 10 days A FACE TO FACE EVALUATION WAS PERFORMED  13/12 03/03/2021, 10:24 AM

## 2021-03-03 NOTE — Progress Notes (Signed)
Patient ID: [name], female   DOB: [birth year], 45 y.o.   MRN: [id number]  Disability information left in pt room.

## 2021-03-04 NOTE — Progress Notes (Signed)
PROGRESS NOTE   Subjective/Complaints:  Patient remains severely aphasic and apraxic.  Does have some right-sided neglect but according to OT this does not affect her ambulation.  ROS: Limited by cognition  Objective:   No results found. Recent Labs    03/03/21 0528  WBC 5.2  HGB 8.4*  HCT 29.5*  PLT 365    Recent Labs    03/03/21 0528  NA 136  K 3.9  CL 102  CO2 26  GLUCOSE 92  BUN 13  CREATININE 1.08*  CALCIUM 8.7*     Intake/Output Summary (Last 24 hours) at 03/04/2021 0900 Last data filed at 03/04/2021 0756 Gross per 24 hour  Intake 357 ml  Output --  Net 357 ml         Physical Exam: Vital Signs Blood pressure 129/76, pulse 61, temperature 98 F (36.7 C), temperature source Oral, resp. rate 16, height 5\' 5"  (1.651 m), weight 109.3 kg, SpO2 100 %.   General: No acute distress Mood and affect are appropriate Heart: Regular rate and rhythm no rubs murmurs or extra sounds Lungs: Clear to auscultation, breathing unlabored, no rales or wheezes Abdomen: Positive bowel sounds, soft nontender to palpation, nondistended Extremities: No clubbing, cyanosis, or edema Skin: No evidence of breakdown, no evidence of rash   Neuro: Alert Right inattention Aphasia Motor: RUE 3/5 with apraxia and inconsistent RLE grossly 4/5.   Assessment/Plan: 1. Functional deficits which require 3+ hours per day of interdisciplinary therapy in a comprehensive inpatient rehab setting. Physiatrist is providing close team supervision and 24 hour management of active medical problems listed below. Physiatrist and rehab team continue to assess barriers to discharge/monitor patient progress toward functional and medical goals  Care Tool:  Bathing    Body parts bathed by patient: Right arm, Left arm, Chest, Abdomen, Front perineal area, Buttocks, Right upper leg, Left upper leg, Right lower leg, Left lower leg, Face    Body parts bathed by helper: Front perineal area, Buttocks     Bathing assist Assist Level: Supervision/Verbal cueing     Upper Body Dressing/Undressing Upper body dressing   What is the patient wearing?: Pull over shirt    Upper body assist Assist Level: Set up assist    Lower Body Dressing/Undressing Lower body dressing      What is the patient wearing?: Underwear/pull up, Pants     Lower body assist Assist for lower body dressing: Minimal Assistance - Patient > 75%     Toileting Toileting Toileting Activity did not occur (Clothing management and hygiene only): N/A (no void or bm)  Toileting assist Assist for toileting: Contact Guard/Touching assist     Transfers Chair/bed transfer  Transfers assist     Chair/bed transfer assist level: Supervision/Verbal cueing     Locomotion Ambulation   Ambulation assist      Assist level: Supervision/Verbal cueing Assistive device: No Device Max distance: 267ft   Walk 10 feet activity   Assist     Assist level: Contact Guard/Touching assist Assistive device: No Device   Walk 50 feet activity   Assist    Assist level: Contact Guard/Touching assist Assistive device: No Device    Walk 150  feet activity   Assist    Assist level: Contact Guard/Touching assist Assistive device: No Device    Walk 10 feet on uneven surface  activity   Assist Walk 10 feet on uneven surfaces activity did not occur: Safety/medical concerns         Wheelchair     Assist Is the patient using a wheelchair?: No Type of Wheelchair: Manual Wheelchair activity did not occur: N/A         Wheelchair 50 feet with 2 turns activity    Assist    Wheelchair 50 feet with 2 turns activity did not occur: N/A   Assist Level: Total Assistance - Patient < 25%   Wheelchair 150 feet activity     Assist      Assist Level: Total Assistance - Patient < 25%   Blood pressure 129/76, pulse 61, temperature 98 F  (36.7 C), temperature source Oral, resp. rate 16, height 5\' 5"  (1.651 m), weight 109.3 kg, SpO2 100 %.  Medical Problem List and Plan: 1.  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  Antithrombotics: -DVT/anticoagulation:  Pharmaceutical: Lovenox             -antiplatelet therapy: DAPT x3 months 3. Pain Management: N/A 4. Mood: LCSW to follow for evaluation and support             -antipsychotic agents: N/A 5. Neuropsych: This patient is not capable of making decisions on her own behalf. 6. Skin/Wound Care: Routine pressure-relief measures. 7. Fluids/Electrolytes/Nutrition: Monitor intake/outputs 8.  HTN: Monitor blood pressures TID.              -- Continue metoprolol, lisinopril and norvasc on home doses   Vitals:   03/03/21 2023 03/04/21 0620  BP: (!) 141/63 129/76  Pulse: 77 61  Resp: 14 16  Temp: 98.2 F (36.8 C) 98 F (36.7 C)  SpO2: 99% 100%   HR in 60s , blood pressure well controlled 9:  Dyslipidemia: HDL- 32.  LDL 58-- > on Lipitor 80 mg 10.  Morbid obesity: BMI 41.  Educated on diet and exercise as well as weight loss to help promote overall health and mobility. 11.  Hypokalemia: Resolved with supplementation. 12.  Acute on chronic renal failure:  Monitor with serial checks. Cr 1.36 on 11/14, improved 1.08 on 11/21 13.  Iron deficiency anemia: Continue iron supplement.    Hb 8.0 on 11/14, stable 8.4 on 11/21 14.  Sleep disturbance: Continue melatonin as well as low-dose Seroquel at bedtime  11/12- slept well  15/ Constipation  Improving  LOS: 11 days A FACE TO FACE EVALUATION WAS PERFORMED  13/12 03/04/2021, 9:00 AM

## 2021-03-04 NOTE — Progress Notes (Signed)
Inpatient Rehabilitation Discharge Medication Review by a Pharmacist  A complete drug regimen review was completed for this patient to identify any potential clinically significant medication issues.  High Risk Drug Classes Is patient taking? Indication by Medication  Antipsychotic Yes Seroquel- sleep  Anticoagulant No   Antibiotic No   Opioid No   Antiplatelet Yes Plavix/Aspirin- secondary preventio s/p MCA infarct  Hypoglycemics/insulin No   Vasoactive Medication Yes Amlodipine, lisinopril, metoprolol tartrate- Hypertension  Chemotherapy No   Other No      Type of Medication Issue Identified Description of Issue Recommendation(s)  Drug Interaction(s) (clinically significant)     Duplicate Therapy     Allergy     No Medication Administration End Date     Incorrect Dose     Additional Drug Therapy Needed     Significant med changes from prior encounter (inform family/care partners about these prior to discharge).    Other       Clinically significant medication issues were identified that warrant physician communication and completion of prescribed/recommended actions by midnight of the next day:  No   Pharmacist comments:   Time spent performing this drug regimen review (minutes):  20   [name] BS, PharmD, BCPS Clinical Pharmacist  03/04/2021 9:32 AM

## 2021-03-04 NOTE — Progress Notes (Signed)
Patient ID: [name], female   DOB: Jun 02, 1975, 45 y.o.   MRN: [id number]  Pt has no DME reccs

## 2021-03-04 NOTE — Progress Notes (Signed)
Physical Therapy Discharge Summary  Patient Details  Name: [name] MRN: [id number] Date of Birth: 07/25/1975  Today's Date: 03/04/2021 PT Individual Time: 0925-1054 PT Individual Time Calculation (min): 89 min    Patient has met 9 of 9 long term goals due to improved activity tolerance, improved balance, improved postural control, ability to compensate for deficits, functional use of  right upper extremity and right lower extremity, improved attention, improved awareness, and improved coordination.  Patient to discharge at an ambulatory level Supervision, without AD.  Patient's care partner attended family education/training and is independent to provide the necessary physical and cognitive assistance at discharge.  All goals met.  Recommendation:  Patient will benefit from ongoing skilled PT services in home health setting to continue to advance safe functional mobility, address ongoing impairments in motor planning for functional mobility tasks, R attention, dynamic gait training for community reintegration, and minimize fall risk.  Equipment: No equipment provided - none needed  Reasons for discharge: treatment goals met and discharge from hospital  Patient/family agrees with progress made and goals achieved: Yes  Skilled Therapeutic Interventions/Progress Updates:  Pt received sitting EOB and agreeable to therapy session via "yes" head nod. Pt performed all functional mobility tasks described below at supervision level without AD - pt continues to require 24hr supervision due to significant R inattention/neglect, apraxia, and aphasia. Pt does demo improved ability to visually scan R and locate doorways on verbal command today compared previously seen by this therapist. In ADL apartment kitchen continued to work on locating large familiar items on verbal command including: sink, microwave, fridge vs freezer, pantry, and coffee maker as these items are familiar to patient -  pt requires max cuing to identify these items and reorient to the kitchen environment but once oriented able to progress to the next higher level task. Pt able to take a frozen food item (ex: frozen pizza) from therapist, who was standing at the pantry, and carry it in her R hand to then place it in the freezer when commanded - pt able to repeat this task 3x  prior to progressing to then having to delineate between request to place an item in the freezer versus the fridge, which was more difficult for pt requiring direct mod/max verbal cuing to perform correctly. This was all a significant improvement compared to when last performed this task with this therapist as pt now able to locate fridge/freezer door with R hand and open/close it with only 1x verbal cuing intermittently throughout. Participated in folding towels/washcloths task with pt having to sort these items then store them in the chest of drawers - this task was significantly easier for pt compared to the kitchen task. At end of session, pt left seated EOB with needs in reach and bed alarm on.  PT Discharge Precautions/Restrictions Precautions Precautions: Fall;Other (comment) Precaution Comments: R inattention/ neglect, apraxia, global aphasia Restrictions Weight Bearing Restrictions: No Pain Pain Assessment Pain Scale: 0-10 Pain Score: 0-No pain Pain Interference Pain Interference Pain Effect on Sleep: 0. Does not apply - I have not had any pain or hurting in the past 5 days Pain Interference with Therapy Activities: 1. Rarely or not at all Pain Interference with Day-to-Day Activities: 1. Rarely or not at all Perception  Perception: Impaired Inattention/Neglect: Does not attend to right visual field;Does not attend to right side of body Figure Ground: appears to have impaired ability to detect figure vs ground but could possibly be due to impaired vision but difficult to  determine due to global aphasia Praxis Praxis:  Impaired Praxis Impairment Details: Motor planning;Ideomotor;Ideation  Cognition  Overall Cognitive Status: Impaired/Different from baseline Arousal/Alertness: Awake/alert Orientation Level: Oriented X4 (given pt 2 options she is able to say "yes/no" for correct one) Year: Other (Comment) (due to aphasia give pt 2 choices & she can select correct one with "yes/no" response) Month:  (due to aphasia give pt 2 choices & she can select correct one with "yes/no" response) Day of Week: Other (Comment) (due to aphasia gave pt 2 choices & she can select correct one with "yes/no" response) Attention: Focused;Sustained Focused Attention: Appears intact Sustained Attention: Appears intact Selective Attention: Impaired Memory:  (unable to determine secondary to aphasia) Awareness: Impaired Problem Solving: Impaired Safety/Judgment: Impaired Comments: safety impacted by apraxia, aphasia, right vision cut and neglect Sensation Sensation Light Touch:  (difficult to determine due to global aphasia and R hemibody inattention/neglect) Hot/Cold: Not tested Proprioception:  (appears intact but difficult to determine due to global aphasia and R hemibody inattention/neglect) Stereognosis: Not tested Additional Comments: Sensation difficult to test secondary to receptive and expressive deficits. Pt able to hold objects spontaneously in the right hand and react to touch on the right side, however at times when holding objects in the right hand, she is not aware of it. 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  Mobility Bed Mobility Bed Mobility: Supine to Sit;Sit to Supine Rolling Right: Independent Rolling Left: Independent Supine to Sit: Independent Sit to Supine: Independent Transfers Transfers: Sit to Stand;Stand to Sit;Stand Pivot Transfers Sit to Stand: Independent Stand to Sit: Independent Stand Pivot Transfers: Supervision/Verbal cueing Stand Pivot Transfer Details: Verbal cues for precautions/safety Transfer (Assistive device): None Locomotion  Gait Ambulation: Yes Gait Assistance: Supervision/Verbal cueing Gait Distance (Feet): 300 Feet Assistive device: None Gait Assistance Details: Verbal cues for precautions/safety Gait Gait: Yes Gait Pattern: Within Functional Limits Gait Pattern: Step-through pattern Gait velocity: decreased Stairs / Additional Locomotion Stairs: Yes Stairs Assistance: Supervision/Verbal cueing Stair Management Technique: Two rails Number of Stairs: 12 Height of Stairs: 6 Ramp: Supervision/Verbal cueing Curb: Supervision/Verbal cueing Wheelchair Mobility Wheelchair Mobility: No  Trunk/Postural Assessment  Cervical Assessment Cervical Assessment: Within Functional Limits Thoracic Assessment Thoracic Assessment: Within Functional Limits Lumbar Assessment Lumbar Assessment: Within Functional Limits Postural Control Postural Control: Within Functional Limits  Balance Balance Balance Assessed: Yes Standardized Balance Assessment Standardized Balance Assessment: Berg Balance Test Berg Balance Test Sit to Stand: Able to stand without using hands and stabilize independently Standing Unsupported: Able to stand safely 2 minutes Sitting with Back Unsupported but Feet Supported on Floor or Stool: Able to sit safely and securely 2 minutes Stand to Sit: Sits safely with minimal use of hands Transfers: Able to transfer  safely, minor use of hands Standing Unsupported with Eyes Closed: Able to stand 10 seconds safely Standing Ubsupported with Feet Together: Able to place feet together independently and stand 1 minute safely From Standing, Reach Forward with Outstretched Arm: Can reach forward >12 cm safely (5") From Standing Position, Pick up Object from Floor: Able to  pick up shoe safely and easily From Standing Position, Turn to Look Behind Over each Shoulder: Looks behind from both sides and weight shifts well Turn 360 Degrees: Able to turn 360 degrees safely in 4 seconds or less Standing Unsupported, Alternately Place Feet on Step/Stool: Able to stand independently and safely and complete 8 steps in 20 seconds Standing Unsupported, One Foot in Front: Able to place foot tandem independently and hold 30 seconds Standing on One Leg: Able to lift leg independently and hold 5-10 seconds Total Score: 54 Static Sitting Balance Static Sitting - Balance Support: Feet supported Static Sitting - Level of Assistance: 6: Modified independent (Device/Increase time) Dynamic Sitting Balance Dynamic Sitting - Balance Support: Feet supported Dynamic Sitting - Level of Assistance: 6: Modified independent (Device/Increase time) Static Standing Balance Static Standing - Balance Support: During functional activity Static Standing - Level of Assistance: 6: Modified independent (Device/Increase time) Dynamic Standing Balance Dynamic Standing - Balance Support: During functional activity Dynamic Standing - Level of Assistance: 5: Stand by assistance Extremity Assessment      RLE Assessment RLE Assessment: Within Functional Limits General Strength Comments: difficult to formally assess due to global aphasia - WFL but functionally appears to have slight decreased strength compared to L LE LLE Assessment LLE Assessment: Within Functional Limits Active Range of Motion (AROM) Comments: [facility]    Tawana Scale , PT, DPT, NCS,  CSRS  03/04/2021, 7:56 AM

## 2021-03-04 NOTE — Plan of Care (Signed)
  Problem: RH Swallowing Goal: LTG Patient will consume least restrictive diet using compensatory strategies with assistance (SLP) Description: LTG:  Patient will consume least restrictive diet using compensatory strategies with assistance (SLP) Outcome: Completed/Met   Problem: RH Comprehension Communication Goal: LTG Patient will comprehend basic/complex auditory (SLP) Description: LTG: Patient will comprehend basic/complex auditory information with cues (SLP). Outcome: Completed/Met   Problem: RH Expression Communication Goal: LTG Patient will express needs/wants via multi-modal(SLP) Description: LTG:  Patient will express needs/wants via multi-modal communication (gestures/written, etc) with cues (SLP) Outcome: Completed/Met Goal: LTG Patient will verbally express basic/complex needs(SLP) Description: LTG:  Patient will verbally express basic/complex needs, wants or ideas with cues  (SLP) Outcome: Completed/Met

## 2021-03-04 NOTE — Progress Notes (Signed)
Occupational Therapy Discharge Summary  Patient Details  Name: [name] MRN: [id number] Date of Birth: 1975/06/24  Today's Date: 03/04/2021 OT Individual Time: 8546-2703 OT Individual Time Calculation (min): 54 min   Session Note:  Pt in bed to start agreeable to completion of ADL tasks.  She was able to gather her clothing with supervision and no assistive device but mod demonstrational cueing to sequence and remove underpants from the pack and scan right in the dresser to locate her clothing secondary to visual deficit.  She was then able to ambulate to the shower and remove clothing in standing at supervision level.  She needed assist with setting up shower temp and then was able to shower with supervision and min demonstrational cueing for incorporating the washcloth to help apply the soap.  She stood to dry off with supervision and then ambulated out to the EOB for dressing.  She was able to apply lotion with setup and when given deodorant, she was able to apply it without tactile cueing from therapist, which is an improvement from previous sessions.  She needed min demonstrational cueing for orientation of LB clothing secondary to placing the wrong LE in the wrong pants leg.  She was able to donn all other pieces with supervision.  Finished session with oral hygiene and fixing her hair at the sink.  Mod demonstrational cueing for scanning to the right to locate all items with mod demonstrational cueing for setup of toothbrush.  She used the RUE spontaneously throughout session, but at times when holding items in her right hand, she was not aware of it and therapist would have to move her RUE into the left visual field.  She was left at end of session in the bed with the call button and phone in reach and safety alarm in place.    Patient has met 9 of 10 long term goals due to improved balance, ability to compensate for deficits, functional use of  RIGHT upper and RIGHT lower extremity,  improved attention, improved awareness, and improved coordination.  Patient to discharge at overall Supervision level.  Patient's care partner is independent to provide the necessary physical and cognitive assistance at discharge.    Reasons goals not met: Pt needs min assist for LB dressing at times  Recommendation:  Patient will benefit from ongoing skilled OT services in home health setting to continue to advance functional skills in the area of BADL, iADL, and Reduce care partner burden.  Feel pt will benefit from continued OT via home health in order to further eval and treat motor planning deficits and ADL functional performance in a familiar environment.  Will also benefit from continued evaluation and treatment of visual deficits as it is unclear what exactly is going on with her vision at this time.    Equipment: No equipment provided  Reasons for discharge: treatment goals met and discharge from hospital  Patient/family agrees with progress made and goals achieved: Yes  OT Discharge Precautions/Restrictions  Precautions Precautions: Fall;Other (comment) Precaution Comments: R inattention/ neglect, apraxia, global aphasia Restrictions Weight Bearing Restrictions: No  Pain Pain Assessment Pain Scale: Faces Pain Score: 0-No pain ADL ADL Eating: Supervision/safety Where Assessed-Eating: Chair Grooming: Supervision/safety Where Assessed-Grooming: Standing at sink Upper Body Bathing: Supervision/safety Where Assessed-Upper Body Bathing: Shower Lower Body Bathing: Supervision/safety Where Assessed-Lower Body Bathing: Shower Upper Body Dressing: Supervision/safety Where Assessed-Upper Body Dressing: Edge of bed Lower Body Dressing: Minimal assistance Where Assessed-Lower Body Dressing: Edge of bed Toileting: Supervision/safety Where Assessed-Toileting:  Glass blower/designer: Close supervision Toilet Transfer Method: Counselling psychologist: Architectural technologist: Close supervison Clinical cytogeneticist Method: Magazine features editor: Close supervision Social research officer, government Method: Heritage manager: Gaffer Baseline Vision/History:  (per report, difficulty seeing in the right eye, but unclear) Patient Visual Report: No change from baseline (unable to state secondary to aphasia) Vision Assessment?: Yes Eye Alignment: Within Functional Limits Ocular Range of Motion: Restricted on the right Alignment/Gaze Preference: Gaze left Tracking/Visual Pursuits: Other (comment) (Pt unable to follow commands for visual testing) Visual Fields: Right homonymous hemianopsia Perception  Perception: Impaired Inattention/Neglect: Does not attend to right visual field;Does not attend to right side of body Praxis Praxis: Impaired Praxis Impairment Details: Motor planning;Ideomotor;Ideation Cognition Overall Cognitive Status: Impaired/Different from baseline Arousal/Alertness: Awake/alert Orientation Level:  (unable to determine secondary to aphasia) Year:  (unable to determine secondary to aphasia) Month:  (unable to determine secondary to aphasia) Day of Week:  (unable to determine secondary to aphasia) Attention: Sustained Sustained Attention: Appears intact Sustained Attention Impairment: Functional basic Selective Attention: Impaired Selective Attention Impairment: Verbal basic;Functional basic Memory:  (unable to determine secondary to aphasia) Immediate Memory Recall:  (unable to determine secondary to aphasia) Memory Recall Sock:  (unable to determine secondary to aphasia) Memory Recall Blue:  (unable to determine secondary to aphasia) Memory Recall Bed:  (unable to determine secondary to aphasia) Awareness: Impaired Awareness Impairment: Anticipatory impairment;Emergent impairment Problem Solving: Impaired Problem Solving Impairment: Functional basic;Verbal  basic Safety/Judgment: Impaired Sensation Sensation Light Touch: Impaired by gross assessment Hot/Cold: Not tested Proprioception: Not tested Stereognosis: Not tested Additional Comments: Sensation difficult to test secondary to receptive and expressive deficits. Pt able to hold objects spontaneously in the right hand and react to touch on the right side, however at times when holding objects in the right hand, she is not aware of it. Coordination Gross Motor Movements are Fluid and Coordinated: Yes Fine Motor Movements are Fluid and Coordinated: No Coordination and Movement Description: Decreased coordination secondary to motor planning deficits. Motor  Motor Motor: Motor apraxia Motor - Discharge Observations: Pt still with significant motor apraxia as well as ideational apraxia Mobility  Bed Mobility Bed Mobility: Rolling Right;Rolling Left;Supine to Sit;Sit to Supine Rolling Right: Independent Rolling Left: Independent Supine to Sit: Independent Sit to Supine: Independent Transfers Sit to Stand: Independent Stand to Sit: Independent  Trunk/Postural Assessment  Cervical Assessment Cervical Assessment: Within Functional Limits Thoracic Assessment Thoracic Assessment: Within Functional Limits Lumbar Assessment Lumbar Assessment: Within Functional Limits  Balance Balance Balance Assessed: Yes Static Sitting Balance Static Sitting - Balance Support: Feet supported Static Sitting - Level of Assistance: 7: Independent Dynamic Sitting Balance Dynamic Sitting - Balance Support: Feet supported Dynamic Sitting - Level of Assistance: 7: Independent Static Standing Balance Static Standing - Balance Support: During functional activity Static Standing - Level of Assistance: 7: Independent Dynamic Standing Balance Dynamic Standing - Balance Support: During functional activity Dynamic Standing - Level of Assistance: 5: Stand by assistance Extremity/Trunk Assessment RUE  Assessment RUE Assessment: Within Functional Limits General Strength Comments: AROM and strength WFLs for selfcare tasks.  Increased motor planning deficits noted which limite formalized MMT. LUE Assessment LUE Assessment: Within Functional Limits General Strength Comments: AROM and strength WFLs for selfcare tasks.  Increased motor planning deficits noted which limite formalized MMT.   Tamanika Heiney OTR/L 03/04/2021, 3:51 PM

## 2021-03-05 DIAGNOSIS — I639 Cerebral infarction, unspecified: Secondary | ICD-10-CM

## 2021-03-05 DIAGNOSIS — I69390 Apraxia following cerebral infarction: Secondary | ICD-10-CM

## 2021-03-05 DIAGNOSIS — I69990 Apraxia following unspecified cerebrovascular disease: Secondary | ICD-10-CM

## 2021-03-05 DIAGNOSIS — I69920 Aphasia following unspecified cerebrovascular disease: Secondary | ICD-10-CM

## 2021-03-05 DIAGNOSIS — G479 Sleep disorder, unspecified: Secondary | ICD-10-CM

## 2021-03-05 DIAGNOSIS — N189 Chronic kidney disease, unspecified: Secondary | ICD-10-CM

## 2021-03-05 DIAGNOSIS — N179 Acute kidney failure, unspecified: Secondary | ICD-10-CM

## 2021-03-05 MED ORDER — PANTOPRAZOLE SODIUM 40 MG PO TBEC: 40.0000 mg | DELAYED_RELEASE_TABLET | Freq: Every day | ORAL | 0 refills | Status: DC

## 2021-03-05 MED ORDER — POLYETHYLENE GLYCOL 3350 17 G PO PACK: 17.0000 g | PACK | Freq: Two times a day (BID) | ORAL | 0 refills | Status: AC

## 2021-03-05 MED ORDER — CLOPIDOGREL BISULFATE 75 MG PO TABS: 75.0000 mg | ORAL_TABLET | Freq: Every day | ORAL | 0 refills | Status: AC

## 2021-03-05 MED ORDER — QUETIAPINE FUMARATE 25 MG PO TABS: 25.0000 mg | ORAL_TABLET | Freq: Every day | ORAL | 0 refills | Status: AC

## 2021-03-05 MED ORDER — ATORVASTATIN CALCIUM 40 MG PO TABS: 40.0000 mg | ORAL_TABLET | Freq: Every day | ORAL | 0 refills | Status: DC

## 2021-03-05 MED ORDER — MELATONIN 3 MG PO TABS: 3.0000 mg | ORAL_TABLET | Freq: Every day | ORAL | 0 refills | Status: AC

## 2021-03-05 MED ORDER — METOPROLOL TARTRATE 50 MG PO TABS: 50.0000 mg | ORAL_TABLET | Freq: Two times a day (BID) | ORAL | 0 refills | Status: DC

## 2021-03-05 MED ORDER — LISINOPRIL 20 MG PO TABS: 20.0000 mg | ORAL_TABLET | Freq: Every day | ORAL | 0 refills | Status: DC

## 2021-03-05 MED ORDER — ACETAMINOPHEN 325 MG PO TABS: 325.0000 mg | ORAL_TABLET | ORAL | Status: AC | PRN

## 2021-03-05 MED ORDER — AMLODIPINE BESYLATE 10 MG PO TABS: 10.0000 mg | ORAL_TABLET | Freq: Every day | ORAL | 0 refills | Status: AC

## 2021-03-05 MED ORDER — FERROUS GLUCONATE 324 (38 FE) MG PO TABS: 324.0000 mg | ORAL_TABLET | Freq: Three times a day (TID) | ORAL | 0 refills | Status: AC

## 2021-03-05 NOTE — Discharge Summary (Signed)
Physician Discharge Summary  Patient ID: [name] MRN: [id number] DOB/AGE: 1975/06/17 45 y.o.  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   Sleep disturbance   Acute on chronic renal failure [facility])   Discharged Condition: stable  Significant Diagnostic Studies:  Labs:  Basic Metabolic Panel: BMP Latest Ref Rng & Units 03/03/2021 02/24/2021 02/22/2021  Glucose 70 - 99 mg/dL 92 100(H) 90  BUN 6 - 20 mg/dL 13 13 14   Creatinine 0.44 - 1.00 mg/dL 1.08(H) 1.36(H) 1.17(H)  Sodium 135 - 145 mmol/L 136 135 134(L)  Potassium 3.5 - 5.1 mmol/L 3.9 3.8 4.1  Chloride 98 - 111 mmol/L 102 103 102  CO2 22 - 32 mmol/L 26 25 24   Calcium 8.9 - 10.3 mg/dL 8.7(L) 8.8(L) 8.8(L)     CBC: CBC Latest Ref Rng & Units 03/03/2021 02/24/2021 02/22/2021  WBC 4.0 - 10.5 K/uL 5.2 5.2 5.1  Hemoglobin 12.0 - 15.0 g/dL 8.4(L) 8.0(L) 8.2(L)  Hematocrit 36.0 - 46.0 % 29.5(L) 27.9(L) 28.6(L)  Platelets 150 - 400 K/uL 365 349 331     CBG: No results for input(s): GLUCAP in the last 168 hours.  Brief HPI:   [name] is a 45 y.o. female female with history of HTN, multiple prior CVA was admitted via [facility] on 11/06 22 with right-sided weakness and inability to speak.  CTA/perfusion head was done showing perfusion mismatch with ischemia in left parietal region and M2 occlusion.  She was transferred to [facility] and underwent cerebral angio with thrombectomy of left M2 reocclusion and 2B revascularization.  MRI brain done showing acute large left MCA and left PCA territory infarct with mild mass-effect and additional multiple small acute infarcts in bilateral cerebral hemispheres  suggestive of embolic event.  2D echo showed EF of 60 to 65% with severely dilated LA and mild dilatation of aorta approximately 37 mm.  Dr. Leonie Man felt the stroke was embolic secondary to intracranial left MCA atherosclerosis and recommended DAPT x3 months.  She has had issues with hypokalemia requiring runs of K.  She was tolerating dysphagia 3 diet and verbal output was improving.  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  CIR was recommended due to functional decline.   Hospital Course: [name] was admitted to rehab 02/21/2021 for inpatient therapies to consist of PT, ST and OT at least three hours five days a week. Past admission physiatrist, therapy team and rehab RN have worked together to provide customized collaborative inpatient rehab.  Her blood pressures were monitored on TID basis and has been stable.  She was maintained on DAPT with serial CBC showing H&H to be stable. Check of BMET showed hyponatremia has resolved, potassium levels to be stable and acute on chronic renal failure has resolved.  Constipation has resolved with augmentation of bowel program.She was maintained on melatonin as well as low-dose Seroquel to help manage insomnia.  Her p.o. intake has been good and she is continent of bowel and bladder. She has made good gains and currently requires supervision due to ongoing limitations secondary to right inattention, apraxia and aphasia.  24-hour supervision is recommended for safety.  She will continue to receive follow-up outpatient PT, OT and ST   Rehab course: During patient's stay in rehab weekly team conferences were held to monitor patient's progress, set goals and discuss barriers to discharge. At admission, patient required min to max assist with ADL task and contact-guard assist with mobility.  She exhibited severe expressive greater than receptive aphasia with minimal verbal  output with occasional spontaneous responses and was able to answer yes/no questions with 50% accuracy. She  has had improvement in activity tolerance, balance, postural control as well as ability to compensate for deficits. She has had improvement in functional use RUE  and RLE as well as improvement in awareness.  She is able to complete ADL tasks with set up assist or supervision.  She continues to require mod demonstration cueing to scan to the right.  She requires supervision for transfers and to ambulate 300 feet without assist device.  She is able to use multimodal cues with gestures to answer simple yes/no questions.  She requires min to mod assist for comprehension and max to total assist for verbal expression.  Verbal output is limited consisting of yes and no and gestures.  She is tolerating regular diet safely with use of safe swallow strategies.  Family education was completed.   Disposition: Home  Diet: Heart healthy  Special Instructions: Family to provide supervision and manage medications  Allergies as of 03/05/2021       Reactions   Labetalol Hcl         Medication List     TAKE these medications    acetaminophen 325 MG tablet Commonly known as: TYLENOL Take 1-2 tablets (325-650 mg total) by mouth every 4 (four) hours as needed for mild pain. What changed:  how much to take reasons to take this   amLODipine 10 MG tablet Commonly known as: NORVASC Take 1 tablet (10 mg total) by mouth daily.   aspirin 81 MG EC tablet Take 1 tablet (81 mg total) by mouth daily. Swallow whole. Notes to patient: Purchase over the counter   atorvastatin 40 MG tablet Commonly known as: LIPITOR Take 1 tablet (40 mg total) by mouth daily.   clopidogrel 75 MG tablet Commonly known as: PLAVIX Take 1 tablet (75 mg total) by mouth daily.   ferrous gluconate 324 MG tablet Commonly known as: FERGON Take 1 tablet (324 mg total) by mouth 3 (three) times daily with meals. Notes to  patient: Purchase over the counter   lisinopril 20 MG tablet Commonly known as: ZESTRIL Take 1 tablet (20 mg total) by mouth daily.   melatonin 3 MG Tabs tablet Take 1 tablet (3 mg total) by mouth at bedtime.   metoprolol tartrate 50 MG tablet Commonly known as: LOPRESSOR Take 1 tablet (50 mg total) by mouth 2 (two) times daily.   pantoprazole 40 MG tablet Commonly known as: PROTONIX Take 1 tablet (40 mg total) by mouth at bedtime.   polyethylene glycol 17 g packet Commonly known as: MIRALAX / GLYCOLAX Take 17 g by mouth 2 (two) times daily. Notes to patient: Purchase over the counter   QUEtiapine 25 MG tablet Commonly known as: SEROQUEL Take 1 tablet (25 mg total) by mouth at bedtime.        Follow-up Information     Cobb, Glenice Laine, FNP Follow up.   Specialty: Family Medicine Contact information: [address] [place] [address] [phone]         Erick Colace,  MD Follow up.   Specialty: Physical Medicine and Rehabilitation Why: office will call you with follow up appointment Contact information: [place] [address] [phone]         [facility]. Call in 5 day(s).   Why: for stroke follow up and input on blood thinners. Contact information: [address]     [place] [phone]                Signed: Bary Leriche 03/05/2021, 4:54 PM

## 2021-03-05 NOTE — Progress Notes (Signed)
Inpatient Rehabilitation Care Coordinator Discharge Note   Patient Details  Name: [name] MRN: [id number] Date of Birth: [birth year]   Discharge location: Home with mother  Length of Stay: 12 Days  Discharge activity level: Supervision  Home/community participation: Mother and Daughter  Patient response XY:VOPFYT Literacy - How often do you need to have someone help you when you read instructions, pamphlets, or other written material from your doctor or pharmacy?: Often  Patient response [id number] Isolation - How often do you feel lonely or isolated from those around you?: Never  Services provided included: MD, RD, PT, OT, SLP, RN, CM, TR, Pharmacy, SW  Financial Services:  Financial Services Utilized: Medicaid    Choices offered to/list presented to: pt mother and daughter  Follow-up services arranged:  Outpatient    Outpatient Servicies: OP at [facility]      Patient response to transportation need: Is the patient able to respond to transportation needs?: Yes In the past 12 months, has lack of transportation kept you from medical appointments or from getting medications?: No In the past 12 months, has lack of transportation kept you from meetings, work, or from getting things needed for daily living?: No    Comments (or additional information):  Patient/Family verbalized understanding of follow-up arrangements:  Yes  Individual responsible for coordination of the follow-up plan: [name] [phone]  Confirmed correct DME delivered: Andria Rhein 03/05/2021    Andria Rhein

## 2021-03-05 NOTE — Discharge Instructions (Addendum)
Inpatient Rehab Discharge Instructions  [name] Discharge date and time: No discharge date for patient encounter.   Activities/Precautions/ Functional Status: Activity: no lifting, driving, or strenuous exercise for till cleared by MD Diet: cardiac diet--limit sweets and carbs.  Wound Care: none needed   Functional status:  ___ No restrictions     ___ Walk up steps independently _X__ 24/7 supervision/assistance   ___ Walk up steps with assistance ___ Intermittent supervision/assistance  ___ Bathe/dress independently ___ Walk with walker     _X__ Bathe/dress with assistance ___ Walk Independently    ___ Shower independently _X__ Walk with assistance    ___ Shower with assistance _X__ No alcohol     ___ Return to work/school ________  Special Instructions: Family needs to assist with medication management.      COMMUNITY REFERRALS UPON DISCHARGE:    Outpatient: PT     OT    ST                Agency: [facility]  Phone: [phone]              Appointment Date/Time: TBD  STROKE/TIA DISCHARGE INSTRUCTIONS SMOKING Cigarette smoking nearly doubles your risk of having a stroke & is the single most alterable risk factor  If you smoke or have smoked in the last 12 months, you are advised to quit smoking for your health. Most of the excess cardiovascular risk related to smoking disappears within a year of stopping. Ask you doctor about anti-smoking medications [place] Quit Line: 1-800-QUIT NOW Free Smoking Cessation Classes [phone]  CHOLESTEROL Know your levels; limit fat & cholesterol in your diet  Lipid Panel     Component Value Date/Time   CHOL 155 02/16/2021 0607   TRIG 216 (H) 02/17/2021 0647   HDL 32 (L) 02/16/2021 0607   CHOLHDL 4.8 02/16/2021 0607   VLDL 65 (H) 02/16/2021 0607   LDLCALC 58 02/16/2021 0607     Many patients benefit from treatment even if their cholesterol is at goal. Goal: Total  Cholesterol (CHOL) less than 160 Goal:  Triglycerides (TRIG) less than 150 Goal:  HDL greater than 40 Goal:  LDL (LDLCALC) less than 100   BLOOD PRESSURE American Stroke Association blood pressure target is less that 120/80 mm/Hg  Your discharge blood pressure is:  BP: (!) 144/80 Monitor your blood pressure Limit your salt and alcohol intake Many individuals will require more than one medication for high blood pressure  DIABETES (A1c is a blood sugar average for last 3 months) Goal HGBA1c is under 7% (HBGA1c is blood sugar average for last 3 months)  Diabetes: Pre-diabetes    Lab Results  Component Value Date   HGBA1C 5.7 (H) 02/16/2021    Your HGBA1c can be lowered with medications, healthy diet, and exercise. Check your blood sugar as directed by your physician Call your physician if you experience unexplained or low blood sugars.  PHYSICAL ACTIVITY/REHABILITATION Goal is 30 minutes at least 4 days per week  Activity: No driving, and Walk with assistance, Therapies: see above Return to work: N/A at this time Activity decreases your risk of heart attack and stroke and makes your heart stronger.  It helps control your weight and blood pressure; helps you relax and can improve your mood. Participate in a regular exercise program. Talk with your doctor about the best form of exercise for you (dancing, walking, swimming, cycling).  DIET/WEIGHT Goal  is to maintain a healthy weight  Your discharge diet is:  Diet Order             Diet regular Room service appropriate? Yes; Fluid consistency: Thin  Diet effective now                   liquids Your height is:  Height: 5\' 5"  (165.1 cm) Your current weight is: Weight: 109.3 kg Your Body Mass Index (BMI) is:  BMI (Calculated): 40.1 Following the type of diet specifically designed for you will help prevent another stroke. Your goal weight is:   Your goal Body Mass Index (BMI) is 19-24. Healthy food habits can help reduce 3 risk  factors for stroke:  High cholesterol, hypertension, and excess weight.  RESOURCES Stroke/Support Group:  Call [phone]   STROKE EDUCATION PROVIDED/REVIEWED AND GIVEN TO PATIENT Stroke warning signs and symptoms How to activate emergency medical system (call 911). Medications prescribed at discharge. Need for follow-up after discharge. Personal risk factors for stroke. Pneumonia vaccine given:  Flu vaccine given:  My questions have been answered, the writing is legible, and I understand these instructions.  I will adhere to these goals & educational materials that have been provided to me after my discharge from the hospital.      My questions have been answered and I understand these instructions. I will adhere to these goals and the provided educational materials after my discharge from the hospital.  Patient/Caregiver Signature _______________________________ Date __________  Clinician Signature _______________________________________ Date __________  Please bring this form and your medication list with you to all your follow-up doctor's appointments.

## 2021-03-05 NOTE — Progress Notes (Signed)
Patient ID: [name], female   DOB: 1975-04-21, 45 y.o.   MRN: [id number]  Pt referral faxed to OP at [facility]

## 2021-03-05 NOTE — Progress Notes (Signed)
PROGRESS NOTE   Subjective/Complaints:  No new issues  ROS: Limited by cognition  Objective:   No results found. Recent Labs    03/03/21 0528  WBC 5.2  HGB 8.4*  HCT 29.5*  PLT 365    Recent Labs    03/03/21 0528  NA 136  K 3.9  CL 102  CO2 26  GLUCOSE 92  BUN 13  CREATININE 1.08*  CALCIUM 8.7*     Intake/Output Summary (Last 24 hours) at 03/05/2021 1159 Last data filed at 03/05/2021 0738 Gross per 24 hour  Intake 238 ml  Output --  Net 238 ml         Physical Exam: Vital Signs Blood pressure (!) 144/83, pulse (!) 58, temperature 98.1 F (36.7 C), resp. rate 18, height 5\' 5"  (1.651 m), weight 109.3 kg, SpO2 100 %.  General: No acute distress Mood and affect are appropriate Heart: Regular rate and rhythm no rubs murmurs or extra sounds Lungs: Clear to auscultation, breathing unlabored, no rales or wheezes Abdomen: Positive bowel sounds, soft nontender to palpation, nondistended Extremities: No clubbing, cyanosis, or edema Skin: No evidence of breakdown, no evidence of rash   Neuro: Alert Right inattention Aphasia Motor: RUE 3/5 with apraxia and inconsistent RLE grossly 4/5.   Assessment/Plan: 1. Functional deficits due to left MCA infarct Stable for D/C today F/u PCP in 3-4 weeks F/u PM&R 2 weeks See D/C summary See D/C instructions   Care Tool:  Bathing    Body parts bathed by patient: Right arm, Left arm, Chest, Abdomen, Front perineal area, Buttocks, Right upper leg, Left upper leg, Right lower leg, Left lower leg, Face   Body parts bathed by helper: Front perineal area, Buttocks     Bathing assist Assist Level: Supervision/Verbal cueing     Upper Body Dressing/Undressing Upper body dressing   What is the patient wearing?: Pull over shirt    Upper body assist Assist Level: Set up assist    Lower Body Dressing/Undressing Lower body dressing      What is the patient  wearing?: Underwear/pull up, Pants     Lower body assist Assist for lower body dressing: Minimal Assistance - Patient > 75%     Toileting Toileting Toileting Activity did not occur (Clothing management and hygiene only): N/A (no void or bm)  Toileting assist Assist for toileting: Supervision/Verbal cueing     Transfers Chair/bed transfer  Transfers assist     Chair/bed transfer assist level: Supervision/Verbal cueing     Locomotion Ambulation   Ambulation assist      Assist level: Supervision/Verbal cueing Assistive device: No Device Max distance: 3103ft   Walk 10 feet activity   Assist     Assist level: Supervision/Verbal cueing Assistive device: No Device   Walk 50 feet activity   Assist    Assist level: Supervision/Verbal cueing Assistive device: No Device    Walk 150 feet activity   Assist    Assist level: Supervision/Verbal cueing Assistive device: No Device    Walk 10 feet on uneven surface  activity   Assist Walk 10 feet on uneven surfaces activity did not occur: Safety/medical concerns   Assist level: Supervision/Verbal  cueing     Wheelchair     Assist Is the patient using a wheelchair?: No Type of Wheelchair: Manual Wheelchair activity did not occur: N/A         Wheelchair 50 feet with 2 turns activity    Assist    Wheelchair 50 feet with 2 turns activity did not occur: N/A   Assist Level: Total Assistance - Patient < 25%   Wheelchair 150 feet activity     Assist      Assist Level: Total Assistance - Patient < 25%   Blood pressure (!) 144/83, pulse (!) 58, temperature 98.1 F (36.7 C), resp. rate 18, height 5\' 5"  (1.651 m), weight 109.3 kg, SpO2 100 %.  Medical Problem List and Plan: 1.  Functional deficits secondary to left MCA infarct with associated expressive and receptive aphasia/apraxia, right hemiparesis, and right visual-spatial deficits  Continue CIR PT, OT, SLP- plan d/c 11/23 family  requests afternoon discharge, 2.  Antithrombotics: -DVT/anticoagulation:  Pharmaceutical: Lovenox             -antiplatelet therapy: DAPT x3 months 3. Pain Management: N/A 4. Mood: LCSW to follow for evaluation and support             -antipsychotic agents: N/A 5. Neuropsych: This patient is not capable of making decisions on her own behalf. 6. Skin/Wound Care: Routine pressure-relief measures. 7. Fluids/Electrolytes/Nutrition: Monitor intake/outputs 8.  HTN: Monitor blood pressures TID.              -- Continue metoprolol, lisinopril and norvasc on home doses   Vitals:   03/05/21 0458 03/05/21 1002  BP: (!) 144/92 (!) 144/83  Pulse: (!) 54 (!) 58  Resp: 18   Temp: 98.1 F (36.7 C)   SpO2: 100%    HR in 60s , blood pressure well controlled 9:  Dyslipidemia: HDL- 32.  LDL 58-- > on Lipitor 80 mg 10.  Morbid obesity: BMI 41.  Educated on diet and exercise as well as weight loss to help promote overall health and mobility. 11.  Hypokalemia: Resolved with supplementation. 12.  Acute on chronic renal failure:  Monitor with serial checks. Cr 1.36 on 11/14, improved 1.08 on 11/21 13.  Iron deficiency anemia: Continue iron supplement.    Hb 8.0 on 11/14, stable 8.4 on 11/21 14.  Sleep disturbance: Continue melatonin as well as low-dose Seroquel at bedtime  11/12- slept well  15/ Constipation  Improving  LOS: 12 days A FACE TO FACE EVALUATION WAS PERFORMED  13/12 03/05/2021, 11:59 AM

## 2021-03-27 ENCOUNTER — Encounter: Payer: Medicaid Other | Attending: Registered Nurse | Admitting: Registered Nurse

## 2021-03-27 ENCOUNTER — Encounter: Payer: Self-pay | Admitting: Registered Nurse

## 2021-03-27 ENCOUNTER — Other Ambulatory Visit: Payer: Self-pay

## 2021-03-27 VITALS — BP 155/91 | HR 64 | Temp 98.4°F | Ht 65.0 in | Wt 256.0 lb

## 2021-03-27 DIAGNOSIS — I10 Essential (primary) hypertension: Secondary | ICD-10-CM

## 2021-03-27 DIAGNOSIS — I69390 Apraxia following cerebral infarction: Secondary | ICD-10-CM

## 2021-03-27 DIAGNOSIS — R4701 Aphasia: Secondary | ICD-10-CM | POA: Diagnosis present

## 2021-03-27 DIAGNOSIS — I63512 Cerebral infarction due to unspecified occlusion or stenosis of left middle cerebral artery: Secondary | ICD-10-CM

## 2021-03-27 DIAGNOSIS — I639 Cerebral infarction, unspecified: Secondary | ICD-10-CM

## 2021-03-27 DIAGNOSIS — G479 Sleep disorder, unspecified: Secondary | ICD-10-CM

## 2021-03-27 NOTE — Progress Notes (Signed)
Subjective:    Patient ID: [name], female    DOB: 09/18/1975, 45 y.o.   MRN: [id number]  HPI: [name] is a 45 y.o. 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 [name] went to [facility] on 02/16/2021 with right sided weakness and the inability to speak. She was seen by Dr Amada Jupiter.  Dr Amada Jupiter H&P Neurology H&P   CC: Aphasia   History is obtained from: Chart review, patient   HPI: [name] is a 45 y.o. female with a history of previous stroke who was last known to be normal around 530.  Around nine, she was having difficulty speaking, but it is unclear exactly when this started.  She was brought into the emergency department at [facility] where she was evaluated by teleneurology.  She was outside the window for tenecteplase, but a CTA/CTP was performed.  This demonstrated an area of ischemia in the left parietal region and an M2 occlusion.  Aspects was 10.   Due to this, she was brought to [facility] emergently for thrombectomy.  CT Head WO Contrast:  IMPRESSION: 1. No acute intracranial abnormality. 2. 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 3. ASPECTS is 10.  CT Angio:  IMPRESSION: 1. Occlusion of the proximal M2 inferior division of the left middle cerebral artery. 2. Area of ischemia in the posterior left MCA territory corresponding to the area supplied by the occluded vessel. 3. Other areas of perfusion deficit in the left cerebellum and right temporal lobe are less certain.   CT Head:  IMPRESSION: 1. Relatively large acute Left MCA infarct. Confluent cytotoxic edema.   2. Evidence also of acute Left Cerebellar PICA territory infarct.   3. But no malignant hemorrhagic transformation. Only trace  rightward midline shift, and mild mass effect on the left 4th ventricle with no ventriculomegaly.   4. Underlying advanced chronic bilateral cerebral and cerebellar hemisphere ischemia.   MRI: Brain:  IMPRESSION: 1. Acute large left MCA and left PICA territory infarcts with mild mass effect. No evidence of hemorrhagic transformation. 2. Additional multiple small acute infarcts in the bilateral cerebral hemispheres, suggestive of embolic events. 3. Advanced chronic white matter disease. Given recent angiogram findings, this may be related to severe intracranial atherosclerotic disease versus vasculitis.    She underwent: Per Dr Pearlean Brownie Note DISCHARGE DIAGNOSIS:   Acute large left MCA ischemic infarct s/p thrombectomy left M2 reocclusion, TICI 2B revascularization only..  Etiology likely underlying intracranial left MCA atherosclerosis  [name] was admitted to inpatient rehabilitation on 02/21/2021 and discharged home on 03/05/2021. She hasn't heard from [facility], this provider called [facility] representative states they don't have a referra.. I sent a e-mail to [facility], and referral was placed. [name] and Mother is aware of he above.  She denies any pain. She rates her pain 0.   [name] with expressive aphasia, mother in room and all questions answered.   Pain Inventory Average Pain 0 Pain Right Now 0 My pain is  no pain  LOCATION OF PAIN  no pain  BOWEL Number of stools per week: 7 Incontinent No   BLADDER Normal    Mobility walk without assistance ability to climb steps?  yes do you drive?  no Do you have any goals  in this area?  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  yes  Neuro/Psych No problems in this area  Prior Studies Any changes since last visit?   no  Physicians involved in your care Any changes since last visit?  no   Family History  Problem Relation Age of Onset   Healthy Mother    Social History   Socioeconomic History   Marital status: Divorced    Spouse name: Not on file   Number of children: 3   Years of education: Not on file   Highest education level: Not on file  Occupational History   Not on file  Tobacco Use   Smoking status: Never    Passive exposure: Past   Smokeless tobacco: Never  Vaping Use   Vaping Use: Never used  Substance and Sexual Activity   Alcohol use: Yes    Comment: occassionally   Drug use: Yes    Types: Marijuana    Comment: occassionally   Sexual activity: Not Currently  Other Topics Concern   Not on file  Social History Narrative   Not on file   Social Determinants of Health   Financial Resource Strain: Not on file  Food Insecurity: Not on file  Transportation Needs: Not on file  Physical Activity: Not on file  Stress: Not on file  Social Connections: Not on file   Past Surgical History:  Procedure Laterality Date   CESAREAN SECTION     IR CT HEAD LTD  02/16/2021   IR CT HEAD LTD  02/16/2021   IR CT HEAD LTD  02/16/2021   IR CT HEAD LTD  02/16/2021   IR PERCUTANEOUS ART THROMBECTOMY/INFUSION INTRACRANIAL INC DIAG ANGIO  02/16/2021   RADIOLOGY WITH ANESTHESIA N/A 02/16/2021   Procedure: IR WITH ANESTHESIA;  Surgeon: Julieanne Cotton, MD;  Location: [facility] OR;  Service: Radiology;  Laterality: N/A;   THYROIDECTOMY     Past Medical History:  Diagnosis Date   Anemia    history of goiter    Hypertension    Low back pain    Stroke (HCC)    BP (!) 156/96   Opioid Risk Score:   Fall Risk Score:  `1  Depression screen PHQ 2/9  No flowsheet data found.  Review of Systems  Neurological:  Positive for speech difficulty.  All other systems reviewed and are negative.     Objective:   Physical Exam Vitals and nursing note reviewed.  Constitutional:      Appearance:  Normal appearance.  Cardiovascular:     Rate and Rhythm: Normal rate and regular rhythm.     Pulses: Normal pulses.     Heart sounds: Normal heart sounds.  Pulmonary:     Effort: Pulmonary effort is normal.     Breath sounds: Normal breath sounds.  Musculoskeletal:     Cervical back: Normal range of motion and neck supple.     Comments: Normal Muscle Bulk and Muscle Testing Reveals:  Upper Extremities: Full ROM and Muscle Strength  on Right 4/5 and Left 5/5  Lower Extremities: Full ROM and Muscle Strength 5/5 Arises from Table Slowly Narrow Based  Gait     Skin:    General: Skin is warm and dry.  Neurological:     Mental Status: She is alert and oriented to person, place, and time.  Psychiatric:  Mood and Affect: Mood normal.        Behavior: Behavior normal.         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 She has a scheduled HFU appointment with Neurology. Continue current medication regimen. Continue to Monitor.  , Sleep Disturbance: Continue Melatonin as needed , Continue to monitor.    Benign Essential Hypertension: Continue current medication: PCP Following. Continue to Monitor.  F/U with Dr Wynn Banker in 4- 6 weeks.

## 2021-04-01 ENCOUNTER — Other Ambulatory Visit: Payer: Self-pay

## 2021-04-01 ENCOUNTER — Ambulatory Visit: Payer: Medicaid Other | Attending: Registered Nurse | Admitting: Occupational Therapy

## 2021-04-01 ENCOUNTER — Ambulatory Visit: Payer: Medicaid Other | Admitting: Speech Pathology

## 2021-04-01 ENCOUNTER — Encounter: Payer: Self-pay | Admitting: Occupational Therapy

## 2021-04-01 DIAGNOSIS — I6381 Other cerebral infarction due to occlusion or stenosis of small artery: Secondary | ICD-10-CM

## 2021-04-01 DIAGNOSIS — M6281 Muscle weakness (generalized): Secondary | ICD-10-CM | POA: Insufficient documentation

## 2021-04-01 DIAGNOSIS — I69390 Apraxia following cerebral infarction: Secondary | ICD-10-CM | POA: Diagnosis present

## 2021-04-01 DIAGNOSIS — R41841 Cognitive communication deficit: Secondary | ICD-10-CM | POA: Diagnosis present

## 2021-04-01 DIAGNOSIS — I639 Cerebral infarction, unspecified: Secondary | ICD-10-CM

## 2021-04-01 DIAGNOSIS — R278 Other lack of coordination: Secondary | ICD-10-CM | POA: Diagnosis present

## 2021-04-01 DIAGNOSIS — R482 Apraxia: Secondary | ICD-10-CM

## 2021-04-01 DIAGNOSIS — H5410 Blindness, one eye, low vision other eye, unspecified eyes: Secondary | ICD-10-CM | POA: Insufficient documentation

## 2021-04-01 DIAGNOSIS — R4701 Aphasia: Secondary | ICD-10-CM | POA: Insufficient documentation

## 2021-04-01 NOTE — Patient Instructions (Signed)
Practice oral motor/imitative patterns - blowing kisses, blowing bubbles

## 2021-04-01 NOTE — Therapy (Signed)
[facility] MAIN [facility] SERVICES [address] [place], [place], [address] Phone: [phone]   Fax:  [phone]  Occupational Therapy Evaluation  Patient Details  Name: [name] MRN: [id number] Date of Birth: 06/11/1975 Referring Provider (OT): Jacalyn Lefevre   Encounter Date: 04/01/2021   OT End of Session - 04/01/21 1427     Visit Number 1    Number of Visits 24    Date for OT Re-Evaluation 06/24/21    Authorization Type Progress reporting period starting 04/01/2021    OT Start Time 1100    OT Stop Time 1155    OT Time Calculation (min) 55 min    Activity Tolerance Patient tolerated treatment well    Behavior During Therapy [facility] for tasks assessed/performed             Past Medical History:  Diagnosis Date   Anemia    history of goiter    Hypertension    Low back pain    Stroke [facility])     Past Surgical History:  Procedure Laterality Date   CESAREAN SECTION     IR CT HEAD LTD  02/16/2021   IR CT HEAD LTD  02/16/2021   IR CT HEAD LTD  02/16/2021   IR CT HEAD LTD  02/16/2021   IR PERCUTANEOUS ART THROMBECTOMY/INFUSION INTRACRANIAL INC DIAG ANGIO  02/16/2021   RADIOLOGY WITH ANESTHESIA N/A 02/16/2021   Procedure: IR WITH ANESTHESIA;  Surgeon: Julieanne Cotton, MD;  Location: [facility] OR;  Service: Radiology;  Laterality: N/A;   THYROIDECTOMY      There were no vitals filed for this visit.   Subjective Assessment - 04/01/21 1421     Subjective  Pt. was present with her daughter during the initial evaluation.    Patient is accompanied by: Family member    Pertinent History Pt. is a 45 y.o. female who was admitted to the hospital with weakness, and  an inability to speak. Pt. was diagnosed with an Acute Ischemic Left MCA CVA, Left PCA territory Infarcts with mass effect, and additional multiple small acute infarcts in the bilateral hemispheres suggestive of Embolic event.Pt. underwent cerebral Angio with thrombectomy Left  M2 Reocclusion 2B revascularization. Pt/ has Right visual spatial deficits, expressive, and receptive aphasia, aphasia/apraxia. PMHx includes: Hyperlipidemia, slow transit constipation, iron deficiency anemia, HTN, sleep disturbance, Acute on chronic Renal failure. Pt. is a mother of supportive children/family. Pt. works as a Lawyer, and enjoys singing, and watching T.V.    Currently in Pain? No/denies               [facility] OT Assessment - 04/01/21 1106       Assessment   Medical Diagnosis CVA    Referring Provider (OT) Jacalyn Lefevre    Onset Date/Surgical Date 02/16/21    Hand Dominance Right      Precautions   Precautions None      Restrictions   Weight Bearing Restrictions No      Balance Screen   Has the patient fallen in the past 6 months No    Has the patient had a decrease in activity level because of a fear of falling?  No    Is the patient reluctant to leave their home because of a fear of falling?  No      Home  Environment   Family/patient expects to be discharged to: Private residence    Living Arrangements Children    Available Help at Discharge Family  Type of Home House    Home Access Level entry    Home Layout Two level    Bathroom Shower/Tub Tub/Shower unit;Curtain    Programmer, systems None    Lives With Family      Prior Function   Level of Independence Independent    Vocation Full time employment    Vocation Requirements CNA    Leisure Singing, watching TV      ADL   Eating/Feeding Independent   Assist with cutting food   Grooming Independent   complete set-up required   Upper Body Bathing Independent    Lower Body Bathing Independent    Upper Body Dressing Independent   assist needed for correct direction of Museum/gallery conservator for menstral care   Tub/Shower Transfer --   Does not use the shower     IADL   Prior Level  of Function Shopping Independent    Shopping Completely unable to shop    Prior Level of Function Light Housekeeping Independent    Light Housekeeping Needs help with all home maintenance tasks   Is able to make her bed   Prior Level of Function Meal Prep Independent    Meal Prep Needs to have meals prepared and served   Per family independent pointing to items that she would like to have from the fridge for her family   Prior Level of Function Best boy Relies on family or friends for transportation    Prior Level of Function Medication Managment Independent    Medication Management Is not capable of dispensing or managing own medication    Prior Level of Function Architect Dependent      Written Expression   Dominant Hand Right    Handwriting 25% legible      Vision - History   Baseline Vision --   Right sided visual deficit     Cognition   Overall Cognitive Status Impaired/Different from baseline    Awareness Impaired    Problem Solving Impaired      Observation/Other Assessments   Focus on Therapeutic Outcomes (FOTO)  50      Coordination   Gross Motor Movements are Fluid and Coordinated No    Fine Motor Movements are Fluid and Coordinated No    9 Hole Peg Test --   Not timed. Max visual, and verbal cues for task initiation. coordination is impaired.     Hand Function   Right Hand Grip (lbs) 9    Right Hand Lateral Pinch 16 lbs    Right Hand 3 Point Pinch 6 lbs    Left Hand Grip (lbs) 21    Left Hand Lateral Pinch 10 lbs    Left 3 point pinch 6 lbs                              OT Education - 04/01/21 1427     Education Details OT services, POC, and goals.    Person(s) Educated Patient    Methods Explanation    Comprehension Verbalized understanding;Returned demonstration;Verbal cues required              OT Short Term Goals - 04/01/21 1709        OT  SHORT TERM GOAL #1   Title Pt.'s FOTO score will improve by 2 points to reflect functional change.    Baseline Eval: FOTO score: 50, TR score 68    Time 6    Period Weeks    Status New    Target Date 05/13/21      OT SHORT TERM GOAL #2   Title --    Baseline --    Time --    Period --    Status --    Target Date --      OT SHORT TERM GOAL #3   Title --    Baseline --    Time --    Period --    Status --    Target Date --      OT SHORT TERM GOAL #4   Title --    Baseline --    Time --    Period --    Status --    Target Date --      OT SHORT TERM GOAL #5   Title .    Baseline --    Time --    Period --    Status --    Target Date --      Additional Short Term Goals   Additional Short Term Goals --      OT SHORT TERM GOAL #6   Title --    Baseline --    Time --    Period --    Status --      OT SHORT TERM GOAL #7   Title --    Baseline --    Time --    Period --    Status --    Target Date --               OT Long Term Goals - 04/01/21 1734       OT LONG TERM GOAL #1   Title Pt. will independently apply toothpaste to her toothbrush.    Baseline Eval: Pt. is able to brush her teeth, however requires full set-up of toothpaste on her toothbrussh.    Time 12    Period Weeks    Status New    Target Date 06/24/21      OT LONG TERM GOAL #2   Title Pt. will independently, and legibly write her name.    Baseline Eval: 25% legibility with difficulty formulating her name.    Time 12    Period Weeks    Status New    Target Date 06/24/21      OT LONG TERM GOAL #3   Title Pt. will independently fold linens from the dryer.    Baseline Eval: Pt. is unable to perform laundry tasks    Time 12    Period Weeks    Status New    Target Date 07/03/21      OT LONG TERM GOAL #4   Title Pt. will independently make a sandwich.    Baseline Eval: Pt. is unable    Time 12    Period Weeks    Status New    Target Date 07/03/21      OT LONG TERM  GOAL #5   Title Pt./caregivers will demonstrate knowledge of visual compensatory strategies for tabletop ADL/IADL tasks.    Baseline Eval: Does not utilize visual compensatory strategies    Time 12    Period Weeks    Status New    Target Date 07/03/21  Long Term Additional Goals   Additional Long Term Goals Yes      OT LONG TERM GOAL #6   Title Pt./caregivers will demonstrate visual compensatory strategies for navigating through her extrapersonal space    Baseline Eval: Does not utilize visual compensatory strategies.    Time 12    Period Weeks    Status New    Target Date 06/24/21                   Plan - 04/01/21 1428     Clinical Impression Statement Pt. is a 45 y.o. female who was diagnosed   with a CVA. pt. underwent a cerbral angio with thrombectomy of left M2   reocclusion, 2B revascularization. Left PCA territory infarcts with mass   effect and additional multiple small infarcts in the bilateral   hemispheres suggestive of embolic event. Pt. has a history of multiple   infarcts. Pt. presents with right sided visual spatial deficits,   expressive, and receptive aphasia, apraxia impaired cognition, impaired   strength, and fine motor coordination skills which hinder her ability to   perform daily ADL, and IADL functioning. Pt.'s FOTO score 50 with TR score 68. Pt. required daughter assist in completing the assessment. Pt. Requires step by step verbal, visual cues, and tactile cues. Pt. will benefit from skilled OT services to improve RUE functioning, and  provide pt./caregiver education about visual compensatory strategies during ADL, and IADL functioning.   OT Occupational Profile and History Detailed Assessment- Review of Records and additional review of physical, cognitive, psychosocial history related to current functional performance    Occupational performance deficits (Please refer to evaluation for details): ADL's;IADL's    Body Structure / Function / Physical  Skills ADL;IADL;FMC;Coordination;Strength;Endurance;UE functional use;Decreased knowledge of use of DME;Pain;Proprioception    Rehab Potential Good    Clinical Decision Making Several treatment options, min-mod task modification necessary    Comorbidities Affecting Occupational Performance: May have comorbidities impacting occupational performance    Modification or Assistance to Complete Evaluation  Min-Moderate modification of tasks or assist with assess necessary to complete eval    OT Frequency 2x / week    OT Duration 12 weeks    OT Treatment/Interventions Self-care/ADL training;Therapeutic exercise;Neuromuscular education;Therapeutic activities;Patient/family education;DME and/or AE instruction;Cognitive remediation/compensation;Visual/perceptual remediation/compensation;Energy conservation    Consulted and Agree with Plan of Care Patient;Family member/caregiver    Family Member Consulted Daughter             Patient will benefit from skilled therapeutic intervention in order to improve the following deficits and impairments:   Body Structure / Function / Physical Skills: ADL, IADL, FMC, Coordination, Strength, Endurance, UE functional use, Decreased knowledge of use of DME, Pain, Proprioception       Visit Diagnosis: Muscle weakness (generalized)  Other lack of coordination  Cognitive communication deficit  Blindness, one eye, low vision other eye, unspecified eyes    Problem List Patient Active Problem List   Diagnosis Date Noted   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) 03/05/2021   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 03/05/2021   Sleep disturbance 03/05/2021   Acute on chronic renal failure (HCC) 03/05/2021   Slow transit constipation    Iron deficiency anemia    Benign essential HTN    Acute ischemic left middle cerebral artery (MCA) stroke (HCC) 02/21/2021   Stroke (cerebrum) (HCC) 02/16/2021   Middle cerebral artery embolism, left 02/16/2021    Endotracheally intubated    Hypertensive urgency 09/29/2020   History  of ischemic left MCA stroke 09/29/2020   Basal ganglia stroke (HCC) 09/29/2020   Malignant hypertension 09/29/2020   Obesity, Class III, BMI 40-49.9 (morbid obesity) (HCC) 09/29/2020   Hyperlipidemia, mixed 09/29/2020    Olegario Messier, MS, OTR/L 04/01/2021, 5:41 PM  [place] [facility] MAIN [facility] SERVICES [address] [place], [place], [address] Phone: [phone]   Fax:  [phone]  Name: Lynnley Jacqueleen Pulver MRN: [id number] Date of Birth: 1975/12/13

## 2021-04-01 NOTE — Therapy (Signed)
[place] [facility] MAIN [facility] SERVICES [address] [place], [place], [address] Phone: [phone]   Fax:  [phone]  Speech Language Pathology Evaluation  Patient Details  Name: [name] MRN: [id number] Date of Birth: 1975/05/29 Referring Provider (SLP): Jacalyn Lefevre   Encounter Date: 04/01/2021   End of Session - 04/01/21 1318     Visit Number 1    Number of Visits 25    Date for SLP Re-Evaluation 06/24/21    Authorization Type [place] Medicaid Prepaid [employer] Plan    Authorization Time Period 04/01/2021 thru 06/24/2021    Authorization - Visit Number 1    Authorization - Number of Visits 12    Progress Note Due on Visit 10    SLP Start Time 1000    SLP Stop Time  1100    SLP Time Calculation (min) 60 min    Activity Tolerance Patient tolerated treatment well             Past Medical History:  Diagnosis Date   Anemia    history of goiter    Hypertension    Low back pain    Stroke [facility])     Past Surgical History:  Procedure Laterality Date   CESAREAN SECTION     IR CT HEAD LTD  02/16/2021   IR CT HEAD LTD  02/16/2021   IR CT HEAD LTD  02/16/2021   IR CT HEAD LTD  02/16/2021   IR PERCUTANEOUS ART THROMBECTOMY/INFUSION INTRACRANIAL INC DIAG ANGIO  02/16/2021   RADIOLOGY WITH ANESTHESIA N/A 02/16/2021   Procedure: IR WITH ANESTHESIA;  Surgeon: Julieanne Cotton, MD;  Location: [facility] OR;  Service: Radiology;  Laterality: N/A;   THYROIDECTOMY      There were no vitals filed for this visit.   Subjective Assessment - 04/01/21 1311     Subjective pt pleasant, accompanied by her daughter    Patient is accompained by: Family member    Currently in Pain? 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   Pt's chart is somewhat difficult to organize given pt inconsistencies as she has given different histories at different times to different providers including for example different reports on the number of miscarriages she has had or whether or not she has had miscarriages, inability to remember some hospitalizations and stroke symptoms etc.    From what is noted in medical records she did have a presentation to [facility] in December 2020 with speech changes and was diagnosed with Left MCA CVA (no record found in chart).   Pt presented 07/29/2020 with unclear symptoms, diagnosed with an ischemic stroke on MRI but left AMA.  She then represented on 08/05/2020 with episodic alterations in mental status (confusion).   MRI brain showed restricted diffusion in the posterior right temporal lobe, right lateral parietal lobe and anterior  lateral portions of the right occipital lobe as well as right insula.  Vessel imaging was notable for multifocal stenoses with concern for vasculitis versus atherosclerosis. TTE and TEE were negative for intracardiac thrombus or PFO.  Prolonged EEG demonstrated focal slowing in the right posterior quadrant but no electrographic seizures/epileptiform activity. Pt experienced residual cognitive deficits.   Pt presented 09/29/2020 with the following MRI results:  Redemonstrated chronic cortical/subcortical infarcts within the mid left frontal lobe and right MCA vascular territory. New from the prior head CT of 08/28/2020, there is a 1.8 cm focus of curvilinear hyperdensity within the chronic right parietal lobe infarction territory, which may reflect interval development of cortical laminar necrosis  or petechial hemorrhage. 2. Known chronic lacunar infarcts within the bilateral basal ganglia and left thalamus.  Stable background moderate cerebral white matter chronic small vessel ischemic disease. Redemonstrated chronic infarcts within the right cerebellum.   During most recent hospitalization on 02/18/2021 pt's MRI revealed 1.) Acute large left MCA and left PICA territory infarcts with mild mass effect. No evidence of hemorrhagic transformation. 2. Additional multiple small acute infarcts in the bilateral cerebral hemispheres, suggestive of embolic events. 3. Advanced chronic white matter disease. Given recent angiogram findings, this may be related to severe intracranial atherosclerotic disease versus vasculitis.    CIR 02/22/2021 thru 03/04/2021.   Behavioral/Cognition appropriate    Mobility Status ambulatory      Balance Screen   Has the patient fallen in the past 6 months No    Has the patient had a decrease in activity level because of a fear of falling?  No    Is the patient reluctant to leave their home because of a fear of falling?  No      Prior Functional Status   Cognitive/Linguistic Baseline Within functional limits    Type of Home House     Lives With Alone    Available Support Family    Vocation Full time employment      Cognition   Overall Cognitive Status Difficult to assess    Difficult to assess due to Impaired communication      Auditory Comprehension   Overall Auditory Comprehension Impaired    Yes/No Questions Impaired    Basic Biographical Questions 51-75% accurate    Basic Immediate Environment Questions 25-49% accurate    Complex Questions 0-24% accurate    Commands Impaired    One Step Basic Commands 0-24% accurate    Interfering Components Visual impairments;Motor planning    EffectiveTechniques Visual/Gestural cues;Extra processing time;Repetition      Visual Recognition/Discrimination   Discrimination Exceptions to [facility]    L/R Discrimination Able  in field of 2    Common Objects Able in field of 2    Photographs Able in field of 2    Pictures Unable to identify    Black/White Line Drawings Unable to identify      Reading Comprehension   Reading Status Impaired    Word level 0-25% accurate    Sentence Level 0-25% accurate    Paragraph Level Not tested    Functional Environmental (signs, name badge) Impaired    Interfering Components Visual scanning;Right neglect/inattention    Effective Techniques Large print      Expression   Primary Mode of Expression Verbal      Verbal Expression   Overall Verbal Expression Impaired    Initiation Impaired    Level of Generative/Spontaneous Verbalization Word    Repetition Impaired    Level of Impairment Word  level    Naming Impairment    Responsive 0-25% accurate    Confrontation 0-24% accurate    Convergent 0-24% accurate    Divergent Not tested    Pragmatics Impairment    Impairments Abnormal affect    Non-Verbal Means of Communication Gestures      Written Expression   Dominant Hand Right    Written Expression Exceptions to [facility]    Trace Ability --   unable d/t motor planning deficits     Oral Motor/Sensory Function   Overall Oral Motor/Sensory Function Appears within functional limits for tasks assessed      Motor Speech   Overall Motor Speech Impaired    Respiration Within functional limits    Phonation Normal    Articulation Impaired    Level of Impairment Word    Intelligibility --   unablet oa ssess d/t inability to produce sounds other than "yes/no"   Motor Planning Impaired    Level of Impairment Word    Motor Speech Errors Groping for words;Consistent    Phonation [facility]      Standardized Assessments   Standardized Assessments  Western Aphasia Battery                             SLP Education - 04/01/21 1318     Education Details results of assessment and ST POC, SGD    Person(s) Educated Patient;Child(ren)    Methods  Explanation;Demonstration;Verbal cues;Handout    Comprehension Verbalized understanding;Need further instruction              SLP Short Term Goals - 04/01/21 1321       SLP SHORT TERM GOAL #1   Title Pt will complete SGD assessment.    Baseline new goal    Time 10    Period --   sessions   Status New      SLP SHORT TERM GOAL #2   Title With minimal assistance, pt will match picture with object in field of 3 with 75% accuracy across 5 sessions.    Baseline 65% with moderate assistance    Time 10    Period --   sessions   Status New      SLP SHORT TERM GOAL #3   Title Pt will imitate set of meaningful vocalizations in 3 out of 5 opportunties during structured speech/language tasks with mod A multimodal cues    Baseline maximal assistance for 1 out of 5 opportunities    Time 10    Period --   sessions   Status New              SLP Long Term Goals - 04/01/21 1327       SLP LONG TERM GOAL #1   Title Using an SGD, pt will indicate 1 need in 3 out of 5 opportunities with communication partners.    Baseline inability to communicate basic wants/needs with multimodal means    Time 12    Period Weeks    Status New    Target Date 06/24/21              Plan - 04/01/21 1320     Clinical Impression Statement Pt presents with severe expressive > receptive language impairment with severe motor planning impairments impacting oral motor movements and speech productions. As a result, pt is largely nonverbal with production of "yes/no" observed but were used incorrectly. Pt's communication deficits are further compounded by  motor planning deficits impacting her UE and her visual deficits that appear as a right visual field deficits and neglect. The Western Aphasia Battery was attempted but largely inappropriate d/t the severity of the above deficits. Informally, pt was able to select requested objects in field of 3, unable to use printed word yes/no to indicate verification of  spoken answer, moderate assistance to match picture to object in field of 3 to achieve 50% accuracy. Discussed findings of evaluation with pt's daughter as well as possible use of trial SGD from [place]. At this time, pt is unable to communication any wants/needs and is completely dependent on her family to interpret pt's needs.    Speech Therapy Frequency 2x / week    Duration 12 weeks    Treatment/Interventions SLP instruction and feedback;Patient/family education;Functional tasks;Compensatory techniques;Language facilitation;Environmental controls;Multimodal communcation approach;Compensatory strategies;Internal/external aids;Cueing hierarchy    Potential to Achieve Goals Fair    Potential Considerations Severity of impairments    SLP Home Exercise Plan provided, see pt instructions section    Consulted and Agree with Plan of Care Patient;Family member/caregiver    Family Member Consulted pt's daughter             Patient will benefit from skilled therapeutic intervention in order to improve the following deficits and impairments:   Aphasia  Apraxia  Cognitive communication deficit  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)  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  Basal ganglia stroke [facility])    Problem List Patient Active Problem List   Diagnosis Date Noted   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) 03/05/2021   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 03/05/2021   Sleep disturbance 03/05/2021   Acute on chronic renal failure (HCC) 03/05/2021   Slow transit constipation    Iron deficiency anemia    Benign essential HTN    Acute ischemic left middle cerebral artery (MCA) stroke (HCC) 02/21/2021   Stroke (cerebrum) (HCC) 02/16/2021   Middle cerebral artery embolism, left 02/16/2021   Endotracheally intubated    Hypertensive urgency 09/29/2020   History of ischemic left MCA stroke 09/29/2020   Basal ganglia stroke (HCC) 09/29/2020   Malignant hypertension 09/29/2020    Obesity, Class III, BMI 40-49.9 (morbid obesity) (HCC) 09/29/2020   Hyperlipidemia, mixed 09/29/2020   Jabe Jeanbaptiste B. Dreama Saa M.S., CCC-SLP, [facility] Speech-Language Pathologist Rehabilitation Services Office [phone], [place] 04/01/2021, 2:37 PM  [facility] MAIN [facility] SERVICES [address] [place], [place], [address] Phone: [phone]   Fax:  [phone]  Name: [name] MRN: [id number] Date of Birth: 18-Aug-1975

## 2021-04-10 ENCOUNTER — Ambulatory Visit: Payer: Medicaid Other | Admitting: Occupational Therapy

## 2021-04-10 ENCOUNTER — Ambulatory Visit: Payer: Medicaid Other

## 2021-04-10 ENCOUNTER — Ambulatory Visit: Payer: Medicaid Other | Admitting: Adult Health

## 2021-04-10 ENCOUNTER — Other Ambulatory Visit: Payer: Self-pay

## 2021-04-10 ENCOUNTER — Encounter: Payer: Self-pay | Admitting: Adult Health

## 2021-04-10 ENCOUNTER — Ambulatory Visit: Payer: Medicaid Other | Admitting: Speech Pathology

## 2021-04-10 VITALS — BP 167/104 | HR 66 | Ht 65.0 in | Wt 261.0 lb

## 2021-04-10 DIAGNOSIS — I639 Cerebral infarction, unspecified: Secondary | ICD-10-CM

## 2021-04-10 DIAGNOSIS — I6381 Other cerebral infarction due to occlusion or stenosis of small artery: Secondary | ICD-10-CM

## 2021-04-10 DIAGNOSIS — R4701 Aphasia: Secondary | ICD-10-CM

## 2021-04-10 DIAGNOSIS — R482 Apraxia: Secondary | ICD-10-CM

## 2021-04-10 DIAGNOSIS — R278 Other lack of coordination: Secondary | ICD-10-CM

## 2021-04-10 DIAGNOSIS — M6281 Muscle weakness (generalized): Secondary | ICD-10-CM | POA: Diagnosis not present

## 2021-04-10 NOTE — Therapy (Signed)
[facility] MAIN [facility] SERVICES [address] [place], [place], [address] Phone: [phone]   Fax:  [phone]  Speech Language Pathology Treatment  Patient Details  Name: [name] MRN: [id number] Date of Birth: 08/26/1975 Referring Provider (SLP): Jacalyn Lefevre   Encounter Date: 04/10/2021   End of Session - 04/10/21 1610     Visit Number 2    Number of Visits 25    Date for SLP Re-Evaluation 06/24/21    Authorization Type [place] [employer]    Authorization Time Period 04/01/2021 thru 06/24/2021    Authorization - Visit Number 2    Authorization - Number of Visits 12    Progress Note Due on Visit 10    SLP Start Time 1450    SLP Stop Time  1550    SLP Time Calculation (min) 60 min    Activity Tolerance Patient tolerated treatment well             Past Medical History:  Diagnosis Date   Anemia    history of goiter    Hypertension    Low back pain    Stroke [facility])     Past Surgical History:  Procedure Laterality Date   CESAREAN SECTION     IR CT HEAD LTD  02/16/2021   IR CT HEAD LTD  02/16/2021   IR CT HEAD LTD  02/16/2021   IR CT HEAD LTD  02/16/2021   IR PERCUTANEOUS ART THROMBECTOMY/INFUSION INTRACRANIAL INC DIAG ANGIO  02/16/2021   RADIOLOGY WITH ANESTHESIA N/A 02/16/2021   Procedure: IR WITH ANESTHESIA;  Surgeon: Julieanne Cotton, MD;  Location: [facility] OR;  Service: Radiology;  Laterality: N/A;   THYROIDECTOMY      There were no vitals filed for this visit.   Subjective Assessment - 04/10/21 1558     Subjective "Yeah"    Currently in Pain? No/denies                   ADULT SLP TREATMENT - 04/10/21 1558       General Information   Behavior/Cognition Alert;Cooperative;Pleasant mood      Treatment Provided   Treatment provided Cognitive-Linquistic      Cognitive-Linquistic Treatment   Treatment focused on Aphasia;Apraxia;Patient/family/caregiver education    Skilled  Treatment Completed Quick Assess for AAC via Lingraphica app. Pt touch accuracy (using tablet pen) 100% for icons ranging large to small. Vision field test 0% accuracy; pt made attempts but selected slightly left of images. Phrase completion with icons 60% accuracy. Categorization 60% accuracy. Repetition 0% accuracy and confrontation naming 0% (stereotypic utterance "yeah"). Used clinic Lingraphica TouchTalk model with patient and she demonstrated ability to make simple choices (preferred fruits, vegetables, favorite color, ice cream flavor) by pointing to icons after demonstration, modeling cues. While she had difficulty selecting icons (long fingernails), she was generally unable to use stylus to select due to ideomotor apraxia. Occasionally able to activate icons using her knuckle. Patient appeared excited and eager when using the device, with some spontaneous/rote speech "I like that" on occasion. Functional word recognition: pt selected favorite color from written choice F:2, city from F:3, birth month from F:12. While walking out of session pt gestured to [facility] device, affirmed she is interested in pursuing device trial.      Assessment / Recommendations / Plan   Plan Continue with current plan of care      Progression Toward Goals   Progression toward goals Progressing toward goals  SLP Education - 04/10/21 1609     Education Details take pictures and use images to help pt reference objects/items out of view, SGD    Person(s) Educated Patient;Spouse    Methods Explanation    Comprehension Verbalized understanding;Need further instruction              SLP Short Term Goals - 04/01/21 1321       SLP SHORT TERM GOAL #1   Title Pt will complete SGD assessment.    Baseline new goal    Time 10    Period --   sessions   Status New      SLP SHORT TERM GOAL #2   Title With minimal assistance, pt will match picture with object in field of 3 with 75% accuracy  across 5 sessions.    Baseline 65% with moderate assistance    Time 10    Period --   sessions   Status New      SLP SHORT TERM GOAL #3   Title Pt will imitate set of meaningful vocalizations in 3 out of 5 opportunties during structured speech/language tasks with mod A multimodal cues    Baseline maximal assistance for 1 out of 5 opportunities    Time 10    Period --   sessions   Status New              SLP Long Term Goals - 04/01/21 1327       SLP LONG TERM GOAL #1   Title Using an SGD, pt will indicate 1 need in 3 out of 5 opportunities with communication partners.    Baseline inability to communicate basic wants/needs with multimodal means    Time 12    Period Weeks    Status New    Target Date 06/24/21              Plan - 04/10/21 1611     Clinical Impression Statement Pt presents with severe expressive > receptive language impairment with severe motor planning impairments impacting oral motor movements and speech productions. As a result, pt is largely nonverbal with production of "yes/no" observed but were used incorrectly. Pt's communication deficits are further compounded by motor planning deficits impacting her UE and her visual deficits that appear as a right visual field deficits and neglect. Completed Lingraphica assessment and initiated use of device in session through which pt able to communicate answers to personal questions in context with support. Appears to have some word-level recognition. Patient appears engaged and motivated to communicate, and expressed interest in a communication device trial by pointing at the device as she left the room and nodding eagerly when asked if she wanted to try one at home. At this time, pt is unable to verbally communicate any wants/needs and is completely dependent on her family to interpret pt's needs. Continue skilled ST to maximize functional communication and for use of AAC to communicate wants and needs.    Speech  Therapy Frequency 2x / week    Duration 12 weeks    Treatment/Interventions SLP instruction and feedback;Patient/family education;Functional tasks;Compensatory techniques;Language facilitation;Environmental controls;Multimodal communcation approach;Compensatory strategies;Internal/external aids;Cueing hierarchy    Potential to Achieve Goals Fair    Potential Considerations Severity of impairments    SLP Home Exercise Plan provided, see pt instructions section    Consulted and Agree with Plan of Care Patient;Family member/caregiver    Family Member Consulted pt's daughter  Patient will benefit from skilled therapeutic intervention in order to improve the following deficits and impairments:   Aphasia  Apraxia    Problem List Patient Active Problem List   Diagnosis Date Noted   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) 03/05/2021   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 03/05/2021   Sleep disturbance 03/05/2021   Acute on chronic renal failure (HCC) 03/05/2021   Slow transit constipation    Iron deficiency anemia    Benign essential HTN    Acute ischemic left middle cerebral artery (MCA) stroke (HCC) 02/21/2021   Stroke (cerebrum) (HCC) 02/16/2021   Middle cerebral artery embolism, left 02/16/2021   Endotracheally intubated    Hypertensive urgency 09/29/2020   History of ischemic left MCA stroke 09/29/2020   Basal ganglia stroke (HCC) 09/29/2020   Malignant hypertension 09/29/2020   Obesity, Class III, BMI 40-49.9 (morbid obesity) (HCC) 09/29/2020   Hyperlipidemia, mixed 09/29/2020   Rondel Baton, MS, CCC-SLP Speech-Language Pathologist  Arlana Lindau, CCC-SLP 04/10/2021, 4:15 PM  [facility] MAIN [facility] SERVICES [address] [place], [place], [address] Phone: [phone]   Fax:  [phone]   Name: [name] MRN: [id number] Date of Birth: December 28, 1975

## 2021-04-10 NOTE — Therapy (Signed)
[facility] MAIN [facility] SERVICES [address] [place], [place], [address] Phone: [phone]   Fax:  [phone]  Occupational Therapy Treatment  Patient Details  Name: [name] MRN: [id number] Date of Birth: 07/13/1975 Referring Provider (OT): Jacalyn Lefevre   Encounter Date: 04/10/2021   OT End of Session - 04/10/21 1620     Visit Number 2    Number of Visits 24    Date for OT Re-Evaluation 06/24/21    Authorization Type Progress reporting period starting 04/01/2021    OT Start Time 1406    OT Stop Time 1445    OT Time Calculation (min) 39 min    Activity Tolerance Patient tolerated treatment well    Behavior During Therapy [facility] for tasks assessed/performed             Past Medical History:  Diagnosis Date   Anemia    history of goiter    Hypertension    Low back pain    Stroke [facility])     Past Surgical History:  Procedure Laterality Date   CESAREAN SECTION     IR CT HEAD LTD  02/16/2021   IR CT HEAD LTD  02/16/2021   IR CT HEAD LTD  02/16/2021   IR CT HEAD LTD  02/16/2021   IR PERCUTANEOUS ART THROMBECTOMY/INFUSION INTRACRANIAL INC DIAG ANGIO  02/16/2021   RADIOLOGY WITH ANESTHESIA N/A 02/16/2021   Procedure: IR WITH ANESTHESIA;  Surgeon: Julieanne Cotton, MD;  Location: [facility] OR;  Service: Radiology;  Laterality: N/A;   THYROIDECTOMY      There were no vitals filed for this visit.   Subjective Assessment - 04/10/21 1618     Subjective  Family reports that pt often misses food on her plate or has to turn her plate to see the food in front of her.    Patient is accompanied by: Family member    Pertinent History Pt. is a 45 y.o. female who was admitted to the hospital with weakness, and  an inability to speak. Pt. was diagnosed with an Acute Ischemic Left MCA CVA, Left PCA territory Infarcts with mass effect, and additional multiple small acute infarcts in the bilateral hemispheres suggestive of Embolic event.Pt.  underwent cerebral Angio with thrombectomy Left M2 Reocclusion 2B revascularization. Pt/ has Right visual spatial deficits, expressive, and receptive aphasia, aphasia/apraxia. PMHx includes: Hyperlipidemia, slow transit constipation, iron deficiency anemia, HTN, sleep disturbance, Acute on chronic Renal failure. Pt. is a mother of supportive children/family. Pt. works as a Lawyer, and enjoys singing, and watching T.V.    Currently in Pain? No/denies    Pain Score 0-No pain            Occupational Therapy Treatment: Therapeutic Activity: Facilitated visual scanning at table top lining up 10 cards with pt hunting for selected numbers, with reinforcement to use R hand to place and pick up cards.  Intermittent vc for turning head or using hand to scan to search for end of table to view the entire line of cards L to R.  Visual scanning further addressed with pt searching for selected colors of clothespins on table top and clipping pins onto horizontal and vertical target in front of pt then transferred target for pt to clip pins in R visual field.  Pt required intermittent cues for head turns and turning pin to successfully clip it onto the target.  Intermittent hand over hand guidance to initiate tasks d/t global aphasia.  Attempted writing name but  unsuccessful to follow commands, and although pt is able to hold the pen and form letters, letters are jumbled and do not form words.  Worked tracing circles and identifying multiple circles on a page to further address visual scanning with a paper pencil task.   Response to Treatment: Provided education to pt's mother and daughter who were present today on cueing pt during meal times as needed to encourage head turns to the right to view all food on plate.  Family verbalized understanding and also stated that pt also turns plate.  Intermittent vc for turning head or using hand to scan to search for end of table to view the entire line of cards L to R during table  top scanning activity.  Intermittent hand over hand guidance to initiate tasks d/t global aphasia.  Attempted writing name but unsuccessful to follow commands, and although pt is able to hold the pen and form letters, letters are jumbled and do not form words.  Worked tracing circles and identifying multiple circles on a page to further address visual scanning with a paper pencil task.  Targeted RUE use with playing card activity, clips, and paper pencil activity.  Pt occasionally switched to grab a card or clip with her L non-dominant hand, but resumed use of R hand with tactile cues; suspect mild R sided neglect.  Noted visual/spatial impairment when pt attempted to clip a clothespin multiple times in the wrong direction, but able to correct with verbal cue to turn hand to line up the clip horizontally to a horizontal target, and vertically to a vertical target.    Pt will continue to benefit from skilled OT services to improve RUE functioning, and  provide pt./caregiver education about visual compensatory strategies during ADL, and IADL functioning.     OT Education - 04/10/21 1619     Education Details visual scanning techniques while self feeding    Person(s) Educated Patient;Child(ren);Parent(s)    Methods Explanation    Comprehension Verbalized understanding;Verbal cues required;Need further instruction              OT Short Term Goals - 04/01/21 1709       OT SHORT TERM GOAL #1   Title Pt.'s FOTO score will improve by 2 points to reflect functional change.    Baseline Eval: FOTO score: 50, TR score 68    Time 6    Period Weeks    Status New    Target Date 05/13/21      OT SHORT TERM GOAL #2   Title --    Baseline --    Time --    Period --    Status --    Target Date --      OT SHORT TERM GOAL #3   Title --    Baseline --    Time --    Period --    Status --    Target Date --      OT SHORT TERM GOAL #4   Title --    Baseline --    Time --    Period --     Status --    Target Date --      OT SHORT TERM GOAL #5   Title .    Baseline --    Time --    Period --    Status --    Target Date --      Additional Short Term Goals   Additional Short Term Goals --  OT SHORT TERM GOAL #6   Title --    Baseline --    Time --    Period --    Status --      OT SHORT TERM GOAL #7   Title --    Baseline --    Time --    Period --    Status --    Target Date --               OT Long Term Goals - 04/01/21 1734       OT LONG TERM GOAL #1   Title Pt. will independently apply toothpaste to her toothbrush.    Baseline Eval: Pt. is able to brush her teeth, however requires full set-up of toothpaste on her toothbrussh.    Time 12    Period Weeks    Status New    Target Date 06/24/21      OT LONG TERM GOAL #2   Title Pt. will independently, and legibly write her name.    Baseline Eval: 25% legibility with difficulty formulating her name.    Time 12    Period Weeks    Status New    Target Date 06/24/21      OT LONG TERM GOAL #3   Title Pt. will independently fold linens from the dryer.    Baseline Eval: Pt. is unable to perform laundry tasks    Time 12    Period Weeks    Status New    Target Date 07/03/21      OT LONG TERM GOAL #4   Title Pt. will independently make a sandwich.    Baseline Eval: Pt. is unable    Time 12    Period Weeks    Status New    Target Date 07/03/21      OT LONG TERM GOAL #5   Title Pt./caregivers will demonstrate knowledge of visual compensatory strategies for tabletop ADL/IADL tasks.    Baseline Eval: Does not utilize visual compensatory strategies    Time 12    Period Weeks    Status New    Target Date 07/03/21      Long Term Additional Goals   Additional Long Term Goals Yes      OT LONG TERM GOAL #6   Title Pt./caregivers will demonstrate visual compensatory strategies for navigating through her extrapersonal space    Baseline Eval: Does not utilize visual compensatory strategies.     Time 12    Period Weeks    Status New    Target Date 06/24/21                   Plan - 04/10/21 0847     Clinical Impression Statement Provided education to pt's mother and daughter who were present today on cueing pt during meal times as needed to encourage head turns to the right to view all food on plate.  Family verbalized understanding and also stated that pt also turns plate.  Intermittent vc for turning head or using hand to scan to search for end of table to view the entire line of cards L to R during table top scanning activity.  Intermittent hand over hand guidance to initiate tasks d/t global aphasia.  Attempted writing name but unsuccessful to follow commands, and although pt is able to hold the pen and form letters, letters are jumbled and do not form words.  Worked tracing circles and identifying multiple circles on a page to further  address visual scanning with a paper pencil task.  Targeted RUE use with playing card activity, clips, and paper pencil activity.  Pt occasionally switched to grab a card or clip with her L non-dominant hand, but resumed use of R hand with tactile cues; suspect mild R sided neglect.  Noted visual/spatial impairment when pt attempted to clip a clothespin multiple times in the wrong direction, but able to correct with verbal cue to turn hand to line up the clip horizontally to a horizontal target, and vertically to a vertical target.  Pt will continue to benefit from skilled OT services to improve RUE functioning, and  provide pt./caregiver education about visual compensatory strategies during ADL, and IADL functioning.    OT Occupational Profile and History Detailed Assessment- Review of Records and additional review of physical, cognitive, psychosocial history related to current functional performance    Occupational performance deficits (Please refer to evaluation for details): ADL's;IADL's    Body Structure / Function / Physical Skills  ADL;IADL;FMC;Coordination;Strength;Endurance;UE functional use;Decreased knowledge of use of DME;Pain;Proprioception    Rehab Potential Good    Clinical Decision Making Several treatment options, min-mod task modification necessary    Comorbidities Affecting Occupational Performance: May have comorbidities impacting occupational performance    Modification or Assistance to Complete Evaluation  Min-Moderate modification of tasks or assist with assess necessary to complete eval    OT Frequency 2x / week    OT Duration 12 weeks    OT Treatment/Interventions Self-care/ADL training;Therapeutic exercise;Neuromuscular education;Therapeutic activities;Patient/family education;DME and/or AE instruction;Cognitive remediation/compensation;Visual/perceptual remediation/compensation;Energy conservation    Consulted and Agree with Plan of Care Patient;Family member/caregiver    Family Member Consulted Daughter             Patient will benefit from skilled therapeutic intervention in order to improve the following deficits and impairments:   Body Structure / Function / Physical Skills: ADL, IADL, FMC, Coordination, Strength, Endurance, UE functional use, Decreased knowledge of use of DME, Pain, Proprioception       Visit Diagnosis: Other lack of coordination  Basal ganglia stroke (HCC)    Problem List Patient Active Problem List   Diagnosis Date Noted   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) 03/05/2021   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 03/05/2021   Sleep disturbance 03/05/2021   Acute on chronic renal failure (HCC) 03/05/2021   Slow transit constipation    Iron deficiency anemia    Benign essential HTN    Acute ischemic left middle cerebral artery (MCA) stroke (HCC) 02/21/2021   Stroke (cerebrum) (HCC) 02/16/2021   Middle cerebral artery embolism, left 02/16/2021   Endotracheally intubated    Hypertensive urgency 09/29/2020   History of ischemic left MCA stroke 09/29/2020   Basal  ganglia stroke (HCC) 09/29/2020   Malignant hypertension 09/29/2020   Obesity, Class III, BMI 40-49.9 (morbid obesity) (HCC) 09/29/2020   Hyperlipidemia, mixed 09/29/2020   Danelle Earthly, MS, OTR/L  Otis Dials, OT 04/11/2021, 8:48 AM  [facility] MAIN [facility] SERVICES [address] [place], [place], [address] Phone: [phone]   Fax:  [phone]  Name: [name] MRN: [id number] Date of Birth: Jan 24, 1976

## 2021-04-10 NOTE — Patient Instructions (Signed)
Please follow up with [facility] regarding completing heart monitor and sleep study - [phone]  Continue working with therapies for hopeful ongoing recovery - consider evaluation by eye doctor (ophthalmologist) around mid Feb/March  Continue aspirin 81 mg daily and clopidogrel 75 mg daily  and atorvastatin  for secondary stroke prevention  You will stop aspirin mid February   Continue to follow up with PCP regarding cholesterol and blood pressure management  Maintain strict control of hypertension with blood pressure goal below 130/90 and cholesterol with LDL cholesterol (bad cholesterol) goal below 70 mg/dL.   Signs of a Stroke? Follow the BEFAST method:  Balance Watch for a sudden loss of balance, trouble with coordination or vertigo Eyes Is there a sudden loss of vision in one or both eyes? Or double vision?  Face: Ask the person to smile. Does one side of the face droop or is it numb?  Arms: Ask the person to raise both arms. Does one arm drift downward? Is there weakness or numbness of a leg? Speech: Ask the person to repeat a simple phrase. Does the speech sound slurred/strange? Is the person confused ? Time: If you observe any of these signs, call 911.    Continue to follow with [facility] for stroke management - can follow up with [place] as needed but please don't hesitate to reach out with any questions or concerns      Thank you for coming to see [place] at [facility]. I hope we have been able to provide you high quality care today.  You may receive a patient satisfaction survey over the next few weeks. We would appreciate your feedback and comments so that we may continue to improve ourselves and the health of our patients.

## 2021-04-10 NOTE — Progress Notes (Signed)
[facility] [address] [place]. [place] [address] [phone]       HOSPITAL FOLLOW UP NOTE  [name] Date of Birth:  [birth year] Medical Record Number:  [id number]   Reason for Referral:  hospital stroke follow up    SUBJECTIVE:   CHIEF COMPLAINT:  Chief Complaint  Patient presents with   Follow-up    RM 2 with daughters damiah and Markham Jordan  Pt is well, daughter states pt is having some cognitive issues, speech difficulty and L sided weakness. Pt is currently doing PT/OT    HPI:   [name] is a 45 y.o. female with history of multiple strokes, HTN, HLD, obesity who presented to [facility] ED on 02/16/2021 for aphasia.  Transferred to [facility] for emergent thrombectomy.  Personally viewed hospitalization pertinent progress notes, lab work and imaging.  Evaluated by Dr. Leonie Man.  CTA head/neck showed occlusion of proximal M2 inferior division of left MCA with CT perfusion mismatch volume 74 mL.  Cerebral angio s/p revascularization of occluded inferior division of left MCA achieving TICI 3 reperfusion with reocclusion and reopened with a second pass achieving TICI 2C then reoccluded with unsuccessful x2 contact aspirations with final TICI score 2b/2c. Post CTH showed left basal ganglia and mild left anterior parietal subarachnoid hyperdensity. MR brain showed acute large left MCA and left PICA infarcts with additional multiple small acute infarcts in the bilateral cerebral hemisphere.  EF 60 to 65%.  LDL 58.  A1c 5.7.  Per Dr. Leonie Man, etiology likely underlying intracranial left MCA arthrosclerosis.  Recommended DAPT for 3 months then Plavix alone.  Resumed home meds amlodipine and lisinopril for HTN.  Resumed home meds atorvastatin for HLD.  Placed on Seroquel for agitation. Residual global aphasia, dysphagia, right hemiparesis and left gaze deviation with therapy eval's recommending CIR.   Today, 04/10/2021, patient being seen for initial  hospital follow-up accompanied by her 2 daughters who provides majority of history. Overall doing well since discharge.  Continued aphasia, right-sided weakness, visual deficit, gait impairment and cognitive impairment but does report improvement since discharge.  Recently started SLP and OT and plans on starting PT at the end of next month. Per daughter, right sided weakness - more in hand with numbness and leg weakness more noticeable with ambulation.  Saying a new word every day but mainly answers questions with "yes/no". Still has difficulty understanding what is being said to her. Runs into objects on her right side - daughter reports right sided visual loss and left sided blurred vision (from prior strokes). Lives with her 2 daughters -able to maintain ADLs independently (at times with supervision) -daughters assist with all IADLs.  Denies new stroke/TIA symptoms.  Compliant on aspirin and Plavix as well as atorvastatin without side effects.  Blood pressure today 167/104 - not routinely monitored at home.   Followed by [facility] with prior visit 09/2020 - OV note personally reviewed -recommended pursuing cardiac monitoring and sleep study in setting of recurrent strokes -neither have been completed at this time. She does endorse snoring, chronic insomnia and nocturia  No further concerns at this time       PERTINENT IMAGING  Per recent hospitalization Code Stroke CT head 1. No acute intracranial abnormality. 2. 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 3. ASPECTS is 10. CTA head & neck 1. Occlusion of the proximal M2 inferior division of the left middle cerebral artery. 2. Area of ischemia in the posterior left  MCA territory corresponding to the area supplied by the occluded vessel. 3. Other areas of perfusion deficit in the left cerebellum and right temporal lobe are less certain. CT perfusion CBF (<30%) Volume: 80mL. Perfusion (Tmax>6.0s) volume:  47mL. Mismatch Volume: 77mL Cerebral angio -S/P revascularization of occluded Inf division of Lt MCA with x1 pass with 40 mm solitairex retriever and contact aspiration achieving a TICI 3 reperfusion. 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 Reopened  with sec pass with combination as above.achieving a TICI 2C. Reoccluded ,with unsuccessful x 2 contact aspirations . Final TICI score 2b/2C Post IR CT  Lt basal ganglia blush and mild Lt ant parietal subarachnoid hyperdensity Repeat CT head 11/7: 1. Relatively large acute Left MCA infarct. Confluent cytotoxic edema. 2. Evidence also of acute Left Cerebellar PICA territory infarct. 3. But no malignant hemorrhagic transformation. Only trace rightward midline shift, and mild mass effect on the left 4th ventricle with no ventriculomegaly. 4. Underlying advanced chronic bilateral cerebral and cerebellar hemisphere ischemia. MRI pending 2D Echo EF 60-65%. No shunt LDL 58 HgbA1c 5.7    ROS:   N/A d/t aphasia  PMH:  Past Medical History:  Diagnosis Date   Anemia    history of goiter    Hypertension    Low back pain    Stroke (HCC)     PSH:  Past Surgical History:  Procedure Laterality Date   CESAREAN SECTION     IR CT HEAD LTD  02/16/2021   IR CT HEAD LTD  02/16/2021   IR CT HEAD LTD  02/16/2021   IR CT HEAD LTD  02/16/2021   IR PERCUTANEOUS ART THROMBECTOMY/INFUSION INTRACRANIAL INC DIAG ANGIO  02/16/2021   RADIOLOGY WITH ANESTHESIA N/A 02/16/2021   Procedure: IR WITH ANESTHESIA;  Surgeon: Julieanne Cotton, MD;  Location: [facility] OR;  Service: Radiology;  Laterality: N/A;   THYROIDECTOMY      Social History:  Social History   Socioeconomic History   Marital status: Divorced    Spouse name: Not on file   Number of children: 3   Years of education: Not on file   Highest education level: Not on file  Occupational History   Not on file  Tobacco Use   Smoking status: Never    Passive exposure: Past   Smokeless tobacco: Never  Vaping Use    Vaping Use: Never used  Substance and Sexual Activity   Alcohol use: Yes    Comment: occassionally   Drug use: Yes    Types: Marijuana    Comment: occassionally   Sexual activity: Not Currently  Other Topics Concern   Not on file  Social History Narrative   Not on file   Social Determinants of Health   Financial Resource Strain: Not on file  Food Insecurity: Not on file  Transportation Needs: Not on file  Physical Activity: Not on file  Stress: Not on file  Social Connections: Not on file  Intimate Partner Violence: Not on file    Family History:  Family History  Problem Relation Age of Onset   Healthy Mother     Medications:   Current Outpatient Medications on File Prior to Visit  Medication Sig Dispense Refill   acetaminophen (TYLENOL) 325 MG tablet Take 1-2 tablets (325-650 mg total) by mouth every 4 (four) hours as needed for mild pain.     amLODipine (NORVASC) 10 MG tablet Take 1 tablet (10 mg total) by mouth daily. 30 tablet 0   aspirin EC 81 MG EC tablet  Take 1 tablet (81 mg total) by mouth daily. Swallow whole. 30 tablet 11   atorvastatin (LIPITOR) 80 MG tablet Take by mouth.     clopidogrel (PLAVIX) 75 MG tablet Take 1 tablet (75 mg total) by mouth daily. 30 tablet 0   ferrous gluconate (FERGON) 324 MG tablet Take 1 tablet (324 mg total) by mouth 3 (three) times daily with meals. 90 tablet 0   lisinopril (ZESTRIL) 40 MG tablet Take by mouth.     melatonin 3 MG TABS tablet Take 1 tablet (3 mg total) by mouth at bedtime. 30 tablet 0   metoprolol tartrate (LOPRESSOR) 50 MG tablet Take 50 mg by mouth 2 (two) times daily.     pantoprazole (PROTONIX) 40 MG tablet Take 1 tablet (40 mg total) by mouth at bedtime. 30 tablet 0   polyethylene glycol (MIRALAX / GLYCOLAX) 17 g packet Take 17 g by mouth 2 (two) times daily. 60 each 0   QUEtiapine (SEROQUEL) 25 MG tablet Take 1 tablet (25 mg total) by mouth at bedtime. 30 tablet 0   No current facility-administered  medications on file prior to visit.    Allergies:   Allergies  Allergen Reactions   Labetalol Hcl       OBJECTIVE:  Physical Exam  Vitals:   04/10/21 0907  BP: (!) 167/104  Pulse: 66  Weight: 261 lb (118.4 kg)  Height: 5\' 5"  (1.651 m)   Body mass index is 43.43 kg/m. No results found.  General: Morbidly obese very pleasant middle-aged African-American female, eated, in no evident distress Head: head normocephalic and atraumatic.   Neck: supple with no carotid or supraclavicular bruits Cardiovascular: regular rate and rhythm, no murmurs Musculoskeletal: no deformity Skin:  no rash/petichiae Vascular:  Normal pulses all extremities   Neurologic Exam Mental Status: Awake and fully alert.  Globally aphasic -inconsistently answering yes/no correctly, would say random word occasionally, difficulty following simple commands.  Unable to assess orientation.  Mood and affect appropriate.  Cranial Nerves: Fundoscopic exam unable to complete. Pupils equal, briskly reactive to light. Extraocular movements full without nystagmus. Blinks to threat left side more than right side - unable to do confrontation testing. Hearing intact. Facial sensation intact. Face, tongue, palate moves normally and symmetrically.  Motor: Normal bulk and tone. Normal strength in all tested extremity muscles except slight decreased right hand dexterity. Unable to appreciate RLE weakness but difficulty fully testing (difficulty following commands) Sensory.:  Difficulty fully assessing but responded appropriately to painful stimuli Coordination: Rapid alternating movements normal in all extremities except decreased right hand. Finger-to-nose and heel-to-shin difficulty performing due to difficulty understanding directions. Gait and Station: Arises from chair without difficulty. Stance is broad-based.  Gait demonstrates slightly decreased stride length and step height of RLE and mild imbalance.  Difficulty performing  tandem walk and heel toe.  Reflexes: 1+ and symmetric. Toes downgoing.     NIHSS  7 Modified Rankin  3-4      ASSESSMENT: Aubrianna Regene Eapen is a 45 y.o. year old female with acute large left MCA ischemic infarct in setting of left M2 occlusion and smaller left cerebellar infarct on 02/16/2021 s/p partial revascularization with mechanical thrombectomy and unable to get angioplasty stenting.  Vascular risk factors include multiple prior strokes, HTN, HLD, and obesity.      PLAN:  Left MCA and cerebellar stroke:  Residual deficit: Receptive and expressive aphasia, gait impairment, visual impairment and right sided weakness. Continue therapies. Discussed typical recovery time  Prior hypercoagulable labs  negative (07/2020) Per [facility] recommendations, advised to follow up re: completion of cardiac monitoring and sleep study Continue aspirin 81 mg daily and clopidogrel 75 mg daily  and atorvastatin 40mg  daily for secondary stroke prevention. Continue aspirin for for additional 6 weeks then Plavix alone.   Discussed secondary stroke prevention measures and importance of close PCP follow up for aggressive stroke risk factor management including HTN with BP goal<130/90 and HLD with LDL goal<70. I have gone over the pathophysiology of stroke, warning signs and symptoms, risk factors and their management in some detail with instructions to go to the closest emergency room for symptoms of concern.    Continue to follow with [facility] who has been continuously following patient for prior strokes   CC:  [facility] provider: Dr. Leonie Man PCP: Belenda Cruise, Bunnie Pion, FNP    I spent 59 minutes of face-to-face and non-face-to-face time with patient and daughters.  This included previsit chart review including review of recent hospitalization, lab review, study review, electronic health record documentation, patient and daughters education regarding recent stroke including etiology, secondary stroke  prevention measures and importance of managing stroke risk factors, residual deficits and typical recovery time and answered all other questions to patient and daughters satisfaction  Frann Rider, AGNP-BC  [facility] [address] [place], [place] [address]  Phone [phone] Fax [phone] Note: This document was prepared with digital dictation and possible smart phrase technology. Any transcriptional errors that result from this process are unintentional.

## 2021-04-11 ENCOUNTER — Other Ambulatory Visit: Payer: Self-pay | Admitting: Physical Medicine and Rehabilitation

## 2021-04-16 ENCOUNTER — Ambulatory Visit: Payer: Medicaid Other | Attending: Registered Nurse | Admitting: Occupational Therapy

## 2021-04-16 ENCOUNTER — Encounter: Payer: Self-pay | Admitting: Occupational Therapy

## 2021-04-16 ENCOUNTER — Other Ambulatory Visit: Payer: Self-pay

## 2021-04-16 DIAGNOSIS — H5410 Blindness, one eye, low vision other eye, unspecified eyes: Secondary | ICD-10-CM | POA: Diagnosis present

## 2021-04-16 DIAGNOSIS — R4701 Aphasia: Secondary | ICD-10-CM | POA: Insufficient documentation

## 2021-04-16 DIAGNOSIS — I6381 Other cerebral infarction due to occlusion or stenosis of small artery: Secondary | ICD-10-CM | POA: Diagnosis present

## 2021-04-16 DIAGNOSIS — R278 Other lack of coordination: Secondary | ICD-10-CM | POA: Diagnosis present

## 2021-04-16 DIAGNOSIS — M6281 Muscle weakness (generalized): Secondary | ICD-10-CM | POA: Diagnosis present

## 2021-04-16 DIAGNOSIS — I639 Cerebral infarction, unspecified: Secondary | ICD-10-CM | POA: Insufficient documentation

## 2021-04-16 DIAGNOSIS — R482 Apraxia: Secondary | ICD-10-CM | POA: Insufficient documentation

## 2021-04-16 DIAGNOSIS — R41841 Cognitive communication deficit: Secondary | ICD-10-CM | POA: Insufficient documentation

## 2021-04-16 DIAGNOSIS — I69390 Apraxia following cerebral infarction: Secondary | ICD-10-CM | POA: Insufficient documentation

## 2021-04-16 NOTE — Therapy (Signed)
[facility] MAIN [facility] SERVICES [address] [place], [place], [address] Phone: [phone]   Fax:  [phone]  Occupational Therapy Treatment  Patient Details  Name: [name] MRN: [id number] Date of Birth: 11-May-1975 Referring Provider (OT): Jacalyn Lefevre   Encounter Date: 04/16/2021   OT End of Session - 04/16/21 1423     Visit Number 3    Number of Visits 24    Date for OT Re-Evaluation 06/24/21    Authorization Type Progress reporting period starting 04/01/2021    OT Start Time 1310    OT Stop Time 1345    OT Time Calculation (min) 35 min    Activity Tolerance Patient tolerated treatment well    Behavior During Therapy [facility] for tasks assessed/performed             Past Medical History:  Diagnosis Date   Anemia    history of goiter    Hypertension    Low back pain    Stroke [facility])     Past Surgical History:  Procedure Laterality Date   CESAREAN SECTION     IR CT HEAD LTD  02/16/2021   IR CT HEAD LTD  02/16/2021   IR CT HEAD LTD  02/16/2021   IR CT HEAD LTD  02/16/2021   IR PERCUTANEOUS ART THROMBECTOMY/INFUSION INTRACRANIAL INC DIAG ANGIO  02/16/2021   RADIOLOGY WITH ANESTHESIA N/A 02/16/2021   Procedure: IR WITH ANESTHESIA;  Surgeon: Julieanne Cotton, MD;  Location: [facility] OR;  Service: Radiology;  Laterality: N/A;   THYROIDECTOMY      There were no vitals filed for this visit.   Subjective Assessment - 04/16/21 1423     Subjective  Family reports pt. is pickin gout her own outfits out of the closet.    Patient is accompanied by: Family member    Currently in Pain? No/denies            OT TREATMENT    Pt. worked on visual scanning tasks with emphasis placed on visual scanning from left to the right. Pt. worked on grasping 1/2", 3/4", and 1" washers from a magnetic resistive dish, and placing them onto vertical dowels placed at various vertical, and diagonal angles. Pt. required verbal, tactile, and  visual cues for scanning to the right for accuracy with placement of the washers on the dowels. Pt. Worked matching colors  when grasping resistive clips. Pt. Worked on placing them on horizontal dowels placed at multiple angles to the right. Pt. Worked on visual scanning tasks in preparation for writing, and tabletop top tasks. Pt. had difficulty with following 1 step directions. Pt. requires extensive verbal, visual cues, as well as hand over hand assistance.                          OT Education - 04/16/21 1423     Education Details visual scanning techniques while self feeding    Person(s) Educated Patient;Child(ren);Parent(s)    Methods Explanation    Comprehension Verbalized understanding;Verbal cues required;Need further instruction              OT Short Term Goals - 04/01/21 1709       OT SHORT TERM GOAL #1   Title Pt.'s FOTO score will improve by 2 points to reflect functional change.    Baseline Eval: FOTO score: 50, TR score 68    Time 6    Period Weeks    Status New  Target Date 05/13/21      OT SHORT TERM GOAL #2   Title --    Baseline --    Time --    Period --    Status --    Target Date --      OT SHORT TERM GOAL #3   Title --    Baseline --    Time --    Period --    Status --    Target Date --      OT SHORT TERM GOAL #4   Title --    Baseline --    Time --    Period --    Status --    Target Date --      OT SHORT TERM GOAL #5   Title .    Baseline --    Time --    Period --    Status --    Target Date --      Additional Short Term Goals   Additional Short Term Goals --      OT SHORT TERM GOAL #6   Title --    Baseline --    Time --    Period --    Status --      OT SHORT TERM GOAL #7   Title --    Baseline --    Time --    Period --    Status --    Target Date --               OT Long Term Goals - 04/01/21 1734       OT LONG TERM GOAL #1   Title Pt. will independently apply toothpaste to  her toothbrush.    Baseline Eval: Pt. is able to brush her teeth, however requires full set-up of toothpaste on her toothbrussh.    Time 12    Period Weeks    Status New    Target Date 06/24/21      OT LONG TERM GOAL #2   Title Pt. will independently, and legibly write her name.    Baseline Eval: 25% legibility with difficulty formulating her name.    Time 12    Period Weeks    Status New    Target Date 06/24/21      OT LONG TERM GOAL #3   Title Pt. will independently fold linens from the dryer.    Baseline Eval: Pt. is unable to perform laundry tasks    Time 12    Period Weeks    Status New    Target Date 07/03/21      OT LONG TERM GOAL #4   Title Pt. will independently make a sandwich.    Baseline Eval: Pt. is unable    Time 12    Period Weeks    Status New    Target Date 07/03/21      OT LONG TERM GOAL #5   Title Pt./caregivers will demonstrate knowledge of visual compensatory strategies for tabletop ADL/IADL tasks.    Baseline Eval: Does not utilize visual compensatory strategies    Time 12    Period Weeks    Status New    Target Date 07/03/21      Long Term Additional Goals   Additional Long Term Goals Yes      OT LONG TERM GOAL #6   Title Pt./caregivers will demonstrate visual compensatory strategies for navigating through her extrapersonal space    Baseline Eval: Does not  utilize visual compensatory strategies.    Time 12    Period Weeks    Status New    Target Date 06/24/21                   Plan - 04/16/21 1424     Clinical Impression Statement Pt. worked on visual scanning tasks with emphasis placed on visual scanning from left to the right. Pt. worked on grasping 1/2", 3/4", and 1" washers from a magnetic resistive dish, and placing them onto vertical dowels placed at various vertical, and diagonal angles. Pt. required verbal, tactile, and visual cues for scanning to the right for accuracy with placement of the washers on the dowels. Pt.  Worked matching colors  when grasping resistive clips. Pt. Worked on placing them on horizontal dowels placed at multiple angles to the right. Pt. Worked on visual scanning tasks in preparation for writing, and tabletop top tasks. Pt. had difficulty with following 1 step directions. Pt. requires extensive verbal, visual cues, as well as hand over hand assistance.       OT Occupational Profile and History Detailed Assessment- Review of Records and additional review of physical, cognitive, psychosocial history related to current functional performance    Occupational performance deficits (Please refer to evaluation for details): ADL's;IADL's    Body Structure / Function / Physical Skills ADL;IADL;FMC;Coordination;Strength;Endurance;UE functional use;Decreased knowledge of use of DME;Pain;Proprioception    Rehab Potential Good    Clinical Decision Making Several treatment options, min-mod task modification necessary    Comorbidities Affecting Occupational Performance: May have comorbidities impacting occupational performance    Modification or Assistance to Complete Evaluation  Min-Moderate modification of tasks or assist with assess necessary to complete eval    OT Frequency 2x / week    OT Duration 12 weeks    OT Treatment/Interventions Self-care/ADL training;Therapeutic exercise;Neuromuscular education;Therapeutic activities;Patient/family education;DME and/or AE instruction;Cognitive remediation/compensation;Visual/perceptual remediation/compensation;Energy conservation    Consulted and Agree with Plan of Care Patient;Family member/caregiver    Family Member Consulted Daughter             Patient will benefit from skilled therapeutic intervention in order to improve the following deficits and impairments:   Body Structure / Function / Physical Skills: ADL, IADL, FMC, Coordination, Strength, Endurance, UE functional use, Decreased knowledge of use of DME, Pain, Proprioception       Visit  Diagnosis: Muscle weakness (generalized)  Other lack of coordination    Problem List Patient Active Problem List   Diagnosis Date Noted   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) 03/05/2021   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 03/05/2021   Sleep disturbance 03/05/2021   Acute on chronic renal failure (HCC) 03/05/2021   Slow transit constipation    Iron deficiency anemia    Benign essential HTN    Acute ischemic left middle cerebral artery (MCA) stroke (HCC) 02/21/2021   Stroke (cerebrum) (HCC) 02/16/2021   Middle cerebral artery embolism, left 02/16/2021   Endotracheally intubated    Hypertensive urgency 09/29/2020   History of ischemic left MCA stroke 09/29/2020   Basal ganglia stroke (HCC) 09/29/2020   Malignant hypertension 09/29/2020   Obesity, Class III, BMI 40-49.9 (morbid obesity) (HCC) 09/29/2020   Hyperlipidemia, mixed 09/29/2020    Olegario Messier, MS, OTR/L 04/16/2021, 2:33 PM  [facility] MAIN [facility] SERVICES [address] [place], [place], [address] Phone: [phone]   Fax:  [phone]  Name: [name] MRN: [id number] Date of Birth: September 30, 1975

## 2021-04-18 ENCOUNTER — Ambulatory Visit: Payer: Medicaid Other

## 2021-04-23 ENCOUNTER — Ambulatory Visit: Payer: Medicaid Other | Admitting: Speech Pathology

## 2021-04-23 ENCOUNTER — Other Ambulatory Visit: Payer: Self-pay

## 2021-04-23 ENCOUNTER — Ambulatory Visit: Payer: Medicaid Other | Admitting: Occupational Therapy

## 2021-04-23 DIAGNOSIS — R4701 Aphasia: Secondary | ICD-10-CM

## 2021-04-23 DIAGNOSIS — I639 Cerebral infarction, unspecified: Secondary | ICD-10-CM

## 2021-04-23 DIAGNOSIS — M6281 Muscle weakness (generalized): Secondary | ICD-10-CM | POA: Diagnosis not present

## 2021-04-23 DIAGNOSIS — I6381 Other cerebral infarction due to occlusion or stenosis of small artery: Secondary | ICD-10-CM

## 2021-04-23 DIAGNOSIS — I69390 Apraxia following cerebral infarction: Secondary | ICD-10-CM

## 2021-04-23 DIAGNOSIS — R482 Apraxia: Secondary | ICD-10-CM

## 2021-04-23 NOTE — Therapy (Signed)
[facility] MAIN [facility] SERVICES [address] [place], [place], [address] Phone: [phone]   Fax:  [phone]  Speech Language Pathology Treatment  Patient Details  Name: [name] MRN: [id number] Date of Birth: March 27, 1976 Referring Provider (SLP): Jacalyn Lefevre   Encounter Date: 04/23/2021   End of Session - 04/23/21 1621     Visit Number 3    Number of Visits 25    Date for SLP Re-Evaluation 06/24/21    Authorization Type [place] Medicaid Prepaid Health Plan    Authorization Time Period 04/01/2021 thru 06/24/2021    Authorization - Visit Number 3    Authorization - Number of Visits 12    Progress Note Due on Visit 10    SLP Start Time 1000    SLP Stop Time  1100    SLP Time Calculation (min) 60 min    Activity Tolerance Patient tolerated treatment well             Past Medical History:  Diagnosis Date   Anemia    history of goiter    Hypertension    Low back pain    Stroke [facility])     Past Surgical History:  Procedure Laterality Date   CESAREAN SECTION     IR CT HEAD LTD  02/16/2021   IR CT HEAD LTD  02/16/2021   IR CT HEAD LTD  02/16/2021   IR CT HEAD LTD  02/16/2021   IR PERCUTANEOUS ART THROMBECTOMY/INFUSION INTRACRANIAL INC DIAG ANGIO  02/16/2021   RADIOLOGY WITH ANESTHESIA N/A 02/16/2021   Procedure: IR WITH ANESTHESIA;  Surgeon: Julieanne Cotton, MD;  Location: [facility] OR;  Service: Radiology;  Laterality: N/A;   THYROIDECTOMY      There were no vitals filed for this visit.   Subjective Assessment - 04/23/21 1615     Subjective pt pleasant, accompanied by her daughter, minimal verbalizations    Patient is accompained by: Family member    Currently in Pain? No/denies                   ADULT SLP TREATMENT - 04/23/21 0001       Treatment Provided   Treatment provided Cognitive-Linquistic      Cognitive-Linquistic Treatment   Treatment focused on Aphasia;Apraxia;Patient/family/caregiver  education    Skilled Treatment Skilled treatment session focused on icon configuration on SGD. Left, center and right orientation as well as icon size were trialed with known icons. Most accurate and effecient setting was CENTER with 3 icons per line. SLP further instructed pt's daughter in sending pictures of medicine, pt, pt's mother, pt's children as well as list of favorite restaurants, places to shop, TV shows. [employer] [facility] app account created for pt and customized for pt to use for language activities.              SLP Education - 04/23/21 1620     Education Details SGD, number of therapy sessions allowed by insurance, focus on use of SGD for communication    Person(s) Educated Patient;Child(ren)    Methods Explanation;Demonstration;Verbal cues;Handout    Comprehension Need further instruction              SLP Short Term Goals - 04/01/21 1321       SLP SHORT TERM GOAL #1   Title Pt will complete SGD assessment.    Baseline new goal    Time 10    Period --   sessions   Status New  SLP SHORT TERM GOAL #2   Title With minimal assistance, pt will match picture with object in field of 3 with 75% accuracy across 5 sessions.    Baseline 65% with moderate assistance    Time 10    Period --   sessions   Status New      SLP SHORT TERM GOAL #3   Title Pt will imitate set of meaningful vocalizations in 3 out of 5 opportunties during structured speech/language tasks with mod A multimodal cues    Baseline maximal assistance for 1 out of 5 opportunities    Time 10    Period --   sessions   Status New              SLP Long Term Goals - 04/01/21 1327       SLP LONG TERM GOAL #1   Title Using an SGD, pt will indicate 1 need in 3 out of 5 opportunities with communication partners.    Baseline inability to communicate basic wants/needs with multimodal means    Time 12    Period Weeks    Status New    Target Date 06/24/21              Plan - 04/23/21 1621      Clinical Impression Statement Pt presents with severe expressive > receptive language impairment with severe motor planning impairments impacting oral motor movements and speech productions. As a result, pt is largely nonverbal with production of "yes/no" observed but were used incorrectly. Pt's communication deficits are further compounded by motor planning deficits impacting her UE and her visual deficits that appear as a right visual field deficits and neglect. Iniitiated use of loaner device in session through which pt able to communicate answers to personal questions in context with support. Appears to have some word-level recognition. Patient appears engaged and motivated to communicate, and expressed interest in a communication device. At this time, pt is unable to verbally communicate any wants/needs and is completely dependent on her family to interpret pt's needs. Continue skilled ST to maximize functional communication and for use of AAC to communicate wants and needs.    Speech Therapy Frequency 2x / week    Duration 12 weeks    Treatment/Interventions SLP instruction and feedback;Patient/family education;Functional tasks;Compensatory techniques;Language facilitation;Environmental controls;Multimodal communcation approach;Compensatory strategies;Internal/external aids;Cueing hierarchy    Potential to Achieve Goals Fair    Potential Considerations Severity of impairments    SLP Home Exercise Plan provided, see pt instructions section    Consulted and Agree with Plan of Care Patient;Family member/caregiver    Family Member Consulted pt's daughter             Patient will benefit from skilled therapeutic intervention in order to improve the following deficits and impairments:   Aphasia  Apraxia  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)  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  Basal ganglia stroke [facility](HCC)    Problem List Patient Active Problem List   Diagnosis Date Noted    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) 03/05/2021   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 03/05/2021   Sleep disturbance 03/05/2021   Acute on chronic renal failure (HCC) 03/05/2021   Slow transit constipation    Iron deficiency anemia    Benign essential HTN    Acute ischemic left middle cerebral artery (MCA) stroke (HCC) 02/21/2021   Stroke (cerebrum) (HCC) 02/16/2021   Middle cerebral artery embolism, left 02/16/2021   Endotracheally intubated  Hypertensive urgency 09/29/2020   History of ischemic left MCA stroke 09/29/2020   Basal ganglia stroke (HCC) 09/29/2020   Malignant hypertension 09/29/2020   Obesity, Class III, BMI 40-49.9 (morbid obesity) (HCC) 09/29/2020   Hyperlipidemia, mixed 09/29/2020   Kristi Hyer B. Dreama Saa M.S., CCC-SLP, [facility] Speech-Language Pathologist Rehabilitation Services Office [phone]  Reuel [place], [place] 04/23/2021, 4:23 PM  [place] [facility] MAIN [facility] SERVICES [address] [place], [place], [address] Phone: [phone]   Fax:  [phone]   Name: [name] MRN: [id number] Date of Birth: 08-18-1975

## 2021-04-23 NOTE — Patient Instructions (Signed)
Information to be provided for SGD

## 2021-04-23 NOTE — Therapy (Signed)
[place] [facility] MAIN [facility] SERVICES [address] [place], [place], [address] Phone: [phone]   Fax:  [phone]  Occupational Therapy Treatment  Patient Details  Name: [name] MRN: [id number] Date of Birth: 11-22-1975 Referring Provider (OT): Jacalyn Lefevrehomas, Eunice   Encounter Date: 04/23/2021   OT End of Session - 04/23/21 1154     Visit Number 4    Number of Visits 24    Date for OT Re-Evaluation 06/24/21    Authorization Type Progress reporting period starting 04/01/2021    OT Start Time 0927    OT Stop Time 1000    OT Time Calculation (min) 33 min    Activity Tolerance Patient tolerated treatment well    Behavior [facility] tasks assessed/performed             Past Medical History:  Diagnosis Date   Anemia    history of goiter    Hypertension    Low back pain    Stroke [facility](HCC)     Past Surgical History:  Procedure Laterality Date   CESAREAN SECTION     IR CT HEAD LTD  02/16/2021   IR CT HEAD LTD  02/16/2021   IR CT HEAD LTD  02/16/2021   IR CT HEAD LTD  02/16/2021   IR PERCUTANEOUS ART THROMBECTOMY/INFUSION INTRACRANIAL INC DIAG ANGIO  02/16/2021   RADIOLOGY WITH ANESTHESIA N/A 02/16/2021   Procedure: IR WITH ANESTHESIA;  Surgeon: Julieanne Cottoneveshwar, Sanjeev, MD;  Location: [facility] OR;  Service: Radiology;  Laterality: N/A;   THYROIDECTOMY      There were no vitals filed for this visit.   Subjective Assessment - 04/23/21 1153     Subjective  Pt. was late for the session today. Pt. arrived with her daughter.    Patient is accompanied by: Family member    Pertinent History Pt. is a 46 y.o. female who was admitted to the hospital with weakness, and  an inability to speak. Pt. was diagnosed with an Acute Ischemic Left MCA CVA, Left PCA territory Infarcts with mass effect, and additional multiple small acute infarcts in the bilateral hemispheres suggestive of Embolic event.Pt. underwent cerebral Angio with thrombectomy Left  M2 Reocclusion 2B revascularization. Pt/ has Right visual spatial deficits, expressive, and receptive aphasia, aphasia/apraxia. PMHx includes: Hyperlipidemia, slow transit constipation, iron deficiency anemia, HTN, sleep disturbance, Acute on chronic Renal failure. Pt. is a mother of supportive children/family. Pt. works as a LawyerCNA, and enjoys singing, and watching T.V.    Currently in Pain? No/denies            OT TREATMENT    Therapeutic Activities:  Pt. worked on visual scanning tasks, and visual search strategies following simple vertical, and horizontal design patterns. using The [employer] Judy Pegboard. Pt. work on obtaining, and matching the correct color. Pt. required step, by step verbal, and visual cues. As well as cues for visual demonstration. The pegboard was positioned flat at the tabletop, and with the design pattern vertically displayed in midline in front of her.   Pt. required consistent step-by step visual, and verbal cues for each color. Pt. was able to match the colors with increased time required. At times pt. had difficulty distinguishing between the green, and blue pegs. Pt. Was able to complete the vertical rows with minimal cues. Pt. Required increased cues for the horizontal rows scanning both to the right, and left. On the second design, pt. required all the rows to be blocked with only the  top horizontal row visible. Pt. Was able to self-correct misidentifications. Pt. Continues to work on improving visual compensatory strategies, and visual search strategies during ADLs, and IADL tasks.                     OT Education - 04/23/21 1154     Education Details visual scanning    Person(s) Educated Patient;Child(ren);Parent(s)    Methods Explanation    Comprehension Verbalized understanding;Verbal cues required;Need further instruction              OT Short Term Goals - 04/01/21 1709       OT SHORT TERM GOAL #1   Title Pt.'s FOTO score will improve  by 2 points to reflect functional change.    Baseline Eval: FOTO score: 50, TR score 68    Time 6    Period Weeks    Status New    Target Date 05/13/21      OT SHORT TERM GOAL #2   Title --    Baseline --    Time --    Period --    Status --    Target Date --      OT SHORT TERM GOAL #3   Title --    Baseline --    Time --    Period --    Status --    Target Date --      OT SHORT TERM GOAL #4   Title --    Baseline --    Time --    Period --    Status --    Target Date --      OT SHORT TERM GOAL #5   Title .    Baseline --    Time --    Period --    Status --    Target Date --      Additional Short Term Goals   Additional Short Term Goals --      OT SHORT TERM GOAL #6   Title --    Baseline --    Time --    Period --    Status --      OT SHORT TERM GOAL #7   Title --    Baseline --    Time --    Period --    Status --    Target Date --               OT Long Term Goals - 04/01/21 1734       OT LONG TERM GOAL #1   Title Pt. will independently apply toothpaste to her toothbrush.    Baseline Eval: Pt. is able to brush her teeth, however requires full set-up of toothpaste on her toothbrussh.    Time 12    Period Weeks    Status New    Target Date 06/24/21      OT LONG TERM GOAL #2   Title Pt. will independently, and legibly write her name.    Baseline Eval: 25% legibility with difficulty formulating her name.    Time 12    Period Weeks    Status New    Target Date 06/24/21      OT LONG TERM GOAL #3   Title Pt. will independently fold linens from the dryer.    Baseline Eval: Pt. is unable to perform laundry tasks    Time 12    Period Weeks    Status New  Target Date 07/03/21      OT LONG TERM GOAL #4   Title Pt. will independently make a sandwich.    Baseline Eval: Pt. is unable    Time 12    Period Weeks    Status New    Target Date 07/03/21      OT LONG TERM GOAL #5   Title Pt./caregivers will demonstrate knowledge of  visual compensatory strategies for tabletop ADL/IADL tasks.    Baseline Eval: Does not utilize visual compensatory strategies    Time 12    Period Weeks    Status New    Target Date 07/03/21      Long Term Additional Goals   Additional Long Term Goals Yes      OT LONG TERM GOAL #6   Title Pt./caregivers will demonstrate visual compensatory strategies for navigating through her extrapersonal space    Baseline Eval: Does not utilize visual compensatory strategies.    Time 12    Period Weeks    Status New    Target Date 06/24/21                   Plan - 04/23/21 1154     Clinical Impression Statement Pt. required consistent step-by step visual, and verbal cues for each color. Pt. was able to match the colors with increased time required. At times pt. had difficulty distinguishing between the green, and blue pegs. Pt. Was able to complete the vertical rows with minimal cues. Pt. Required increased cues for the horizontal rows scanning both to the right, and left. On the second design, pt. required all the rows to be blocked with only the top horizontal row visible. Pt. Was able to self-correct misidentifications. Pt. Continues to work on improving visual compensatory strategies, and visual search strategies during ADLs, and IADL tasks.       OT Occupational Profile and History Detailed Assessment- Review of Records and additional review of physical, cognitive, psychosocial history related to current functional performance    Occupational performance deficits (Please refer to evaluation for details): ADL's;IADL's    Body Structure / Function / Physical Skills ADL;IADL;FMC;Coordination;Strength;Endurance;UE functional use;Decreased knowledge of use of DME;Pain;Proprioception    Rehab Potential Good    Clinical Decision Making Several treatment options, min-mod task modification necessary    Comorbidities Affecting Occupational Performance: May have comorbidities impacting  occupational performance    Modification or Assistance to Complete Evaluation  Min-Moderate modification of tasks or assist with assess necessary to complete eval    OT Frequency 2x / week    OT Duration 12 weeks    OT Treatment/Interventions Self-care/ADL training;Therapeutic exercise;Neuromuscular education;Therapeutic activities;Patient/family education;DME and/or AE instruction;Cognitive remediation/compensation;Visual/perceptual remediation/compensation;Energy conservation    Consulted and Agree with Plan of Care Patient;Family member/caregiver    Family Member Consulted Daughter             Patient will benefit from skilled therapeutic intervention in order to improve the following deficits and impairments:   Body Structure / Function / Physical Skills: ADL, IADL, FMC, Coordination, Strength, Endurance, UE functional use, Decreased knowledge of use of DME, Pain, Proprioception       Visit Diagnosis: Muscle weakness (generalized)    Problem List Patient Active Problem List   Diagnosis Date Noted   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) 03/05/2021   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 03/05/2021   Sleep disturbance 03/05/2021   Acute on chronic renal failure (HCC) 03/05/2021   Slow transit constipation  Iron deficiency anemia    Benign essential HTN    Acute ischemic left middle cerebral artery (MCA) stroke (HCC) 02/21/2021   Stroke (cerebrum) (HCC) 02/16/2021   Middle cerebral artery embolism, left 02/16/2021   Endotracheally intubated    Hypertensive urgency 09/29/2020   History of ischemic left MCA stroke 09/29/2020   Basal ganglia stroke (HCC) 09/29/2020   Malignant hypertension 09/29/2020   Obesity, Class III, BMI 40-49.9 (morbid obesity) (HCC) 09/29/2020   Hyperlipidemia, mixed 09/29/2020    Olegario Messier, MS, OTR/L 04/23/2021, 11:59 AM  [place] [facility] MAIN [facility] SERVICES [address] [place], [place],  [address] Phone: [phone]   Fax:  [phone]  Name: [name] MRN: [id number] Date of Birth: [birth year]

## 2021-04-24 ENCOUNTER — Ambulatory Visit: Payer: Medicaid Other | Admitting: Speech Pathology

## 2021-04-24 ENCOUNTER — Ambulatory Visit: Payer: Medicaid Other | Admitting: Occupational Therapy

## 2021-04-24 DIAGNOSIS — M6281 Muscle weakness (generalized): Secondary | ICD-10-CM | POA: Diagnosis not present

## 2021-04-24 DIAGNOSIS — R278 Other lack of coordination: Secondary | ICD-10-CM

## 2021-04-24 DIAGNOSIS — R482 Apraxia: Secondary | ICD-10-CM

## 2021-04-26 ENCOUNTER — Encounter: Payer: Self-pay | Admitting: Occupational Therapy

## 2021-04-26 NOTE — Therapy (Signed)
[facility] MAIN [facility] SERVICES [address] [place], [place], [address] Phone: [phone]   Fax:  [phone]  Occupational Therapy Treatment  Patient Details  Name: [name] MRN: [id number] Date of Birth: [birth year] Referring Provider (OT): Jacalyn Lefevre   Encounter Date: 04/24/2021   OT End of Session - 04/26/21 1614     Visit Number 5    Number of Visits 24    Date for OT Re-Evaluation 06/24/21    Authorization Type Progress reporting period starting 04/01/2021    OT Start Time 0930    OT Stop Time 1000    OT Time Calculation (min) 30 min    Activity Tolerance Patient tolerated treatment well    Behavior During Therapy [facility] for tasks assessed/performed             Past Medical History:  Diagnosis Date   Anemia    history of goiter    Hypertension    Low back pain    Stroke [facility])     Past Surgical History:  Procedure Laterality Date   CESAREAN SECTION     IR CT HEAD LTD  02/16/2021   IR CT HEAD LTD  02/16/2021   IR CT HEAD LTD  02/16/2021   IR CT HEAD LTD  02/16/2021   IR PERCUTANEOUS ART THROMBECTOMY/INFUSION INTRACRANIAL INC DIAG ANGIO  02/16/2021   RADIOLOGY WITH ANESTHESIA N/A 02/16/2021   Procedure: IR WITH ANESTHESIA;  Surgeon: Julieanne Cotton, MD;  Location: [facility] OR;  Service: Radiology;  Laterality: N/A;   THYROIDECTOMY      There were no vitals filed for this visit.   Subjective Assessment - 04/26/21 1612     Subjective  Pt arrived with her 2 daughters and was late to appt.  Therapist had another patient scheduled after patient therefore her session was only 30 mins.    Pertinent History Pt. is a 46 y.o. female who was admitted to the hospital with weakness, and  an inability to speak. Pt. was diagnosed with an Acute Ischemic Left MCA CVA, Left PCA territory Infarcts with mass effect, and additional multiple small acute infarcts in the bilateral hemispheres suggestive of Embolic event.Pt. underwent  cerebral Angio with thrombectomy Left M2 Reocclusion 2B revascularization. Pt/ has Right visual spatial deficits, expressive, and receptive aphasia, aphasia/apraxia. PMHx includes: Hyperlipidemia, slow transit constipation, iron deficiency anemia, HTN, sleep disturbance, Acute on chronic Renal failure. Pt. is a mother of supportive children/family. Pt. works as a Lawyer, and enjoys singing, and watching T.V.    Patient Stated Goals Pt indicates she wants to be more independent with tasks and take care of herself.    Currently in Pain? No/denies    Pain Score 0-No pain             Pt seen this date for design copy with use of large, jumbo peg [employer].  Pt demonstrates difficulty with initiation of task.  If therapist provided correct color and item to the start of the row, patient was able to follow and match color.  She required max cues to attend to pattern for how many items were in a row.  Worked this date in vertical row patterns, difficulty with color green and could not pick out independently, therapist placed correct color next to peg patient was choosing and pt was able to determine it did not match.  Increased cues any time the color green was utilized.   Pt responding yes/no to questions during session.  Response to tx: Pt late to appt and only had 30 mins of time to work on task.  Pt continues to demonstrate difficulty with cognitive processing, initiation of task and with design copy.  She demonstrated the most difficulty with matching the color green to the design and required assistance.  Pt able to respond and place items of a single color in a vertical row but required assist when color changed for next row.  Continue to work towards goals in plan of care to maximize safety and independence in necessary daily tasks.                       OT Education - 04/26/21 1614     Education Details visual scanning, design copy    Person(s) Educated  Patient;Child(ren);Parent(s)    Methods Explanation    Comprehension Verbalized understanding;Verbal cues required;Need further instruction              OT Short Term Goals - 04/01/21 1709       OT SHORT TERM GOAL #1   Title Pt.'s FOTO score will improve by 2 points to reflect functional change.    Baseline Eval: FOTO score: 50, TR score 68    Time 6    Period Weeks    Status New    Target Date 05/13/21      OT SHORT TERM GOAL #2   Title --    Baseline --    Time --    Period --    Status --    Target Date --      OT SHORT TERM GOAL #3   Title --    Baseline --    Time --    Period --    Status --    Target Date --      OT SHORT TERM GOAL #4   Title --    Baseline --    Time --    Period --    Status --    Target Date --      OT SHORT TERM GOAL #5   Title .    Baseline --    Time --    Period --    Status --    Target Date --      Additional Short Term Goals   Additional Short Term Goals --      OT SHORT TERM GOAL #6   Title --    Baseline --    Time --    Period --    Status --      OT SHORT TERM GOAL #7   Title --    Baseline --    Time --    Period --    Status --    Target Date --               OT Long Term Goals - 04/01/21 1734       OT LONG TERM GOAL #1   Title Pt. will independently apply toothpaste to her toothbrush.    Baseline Eval: Pt. is able to brush her teeth, however requires full set-up of toothpaste on her toothbrussh.    Time 12    Period Weeks    Status New    Target Date 06/24/21      OT LONG TERM GOAL #2   Title Pt. will independently, and legibly write her name.    Baseline Eval: 25% legibility with difficulty formulating her name.  Time 12    Period Weeks    Status New    Target Date 06/24/21      OT LONG TERM GOAL #3   Title Pt. will independently fold linens from the dryer.    Baseline Eval: Pt. is unable to perform laundry tasks    Time 12    Period Weeks    Status New    Target Date  07/03/21      OT LONG TERM GOAL #4   Title Pt. will independently make a sandwich.    Baseline Eval: Pt. is unable    Time 12    Period Weeks    Status New    Target Date 07/03/21      OT LONG TERM GOAL #5   Title Pt./caregivers will demonstrate knowledge of visual compensatory strategies for tabletop ADL/IADL tasks.    Baseline Eval: Does not utilize visual compensatory strategies    Time 12    Period Weeks    Status New    Target Date 07/03/21      Long Term Additional Goals   Additional Long Term Goals Yes      OT LONG TERM GOAL #6   Title Pt./caregivers will demonstrate visual compensatory strategies for navigating through her extrapersonal space    Baseline Eval: Does not utilize visual compensatory strategies.    Time 12    Period Weeks    Status New    Target Date 06/24/21                   Plan - 04/26/21 1614     Clinical Impression Statement Pt late to appt and only had 30 mins of time to work on task.  Pt continues to demonstrate difficulty with cognitive processing, initiation of task and with design copy.  She demonstrated the most difficulty with matching the color green to the design and required assistance.  Pt able to respond and place items of a single color in a vertical row but required assist when color changed for next row.  Continue to work towards goals in plan of care to maximize safety and independence in necessary daily tasks.    OT Occupational Profile and History Detailed Assessment- Review of Records and additional review of physical, cognitive, psychosocial history related to current functional performance    Occupational performance deficits (Please refer to evaluation for details): ADL's;IADL's    Body Structure / Function / Physical Skills ADL;IADL;FMC;Coordination;Strength;Endurance;UE functional use;Decreased knowledge of use of DME;Pain;Proprioception    Rehab Potential Good    Clinical Decision Making Several treatment options,  min-mod task modification necessary    Comorbidities Affecting Occupational Performance: May have comorbidities impacting occupational performance    Modification or Assistance to Complete Evaluation  Min-Moderate modification of tasks or assist with assess necessary to complete eval    OT Frequency 2x / week    OT Duration 12 weeks    OT Treatment/Interventions Self-care/ADL training;Therapeutic exercise;Neuromuscular education;Therapeutic activities;Patient/family education;DME and/or AE instruction;Cognitive remediation/compensation;Visual/perceptual remediation/compensation;Energy conservation    Consulted and Agree with Plan of Care Patient;Family member/caregiver    Family Member Consulted Daughter             Patient will benefit from skilled therapeutic intervention in order to improve the following deficits and impairments:   Body Structure / Function / Physical Skills: ADL, IADL, FMC, Coordination, Strength, Endurance, UE functional use, Decreased knowledge of use of DME, Pain, Proprioception       Visit Diagnosis: Muscle weakness (generalized)  Other lack of coordination  Apraxia    Problem List Patient Active Problem List   Diagnosis Date Noted   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) 03/05/2021   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 03/05/2021   Sleep disturbance 03/05/2021   Acute on chronic renal failure (HCC) 03/05/2021   Slow transit constipation    Iron deficiency anemia    Benign essential HTN    Acute ischemic left middle cerebral artery (MCA) stroke (HCC) 02/21/2021   Stroke (cerebrum) (HCC) 02/16/2021   Middle cerebral artery embolism, left 02/16/2021   Endotracheally intubated    Hypertensive urgency 09/29/2020   History of ischemic left MCA stroke 09/29/2020   Basal ganglia stroke (HCC) 09/29/2020   Malignant hypertension 09/29/2020   Obesity, Class III, BMI 40-49.9 (morbid obesity) (HCC) 09/29/2020   Hyperlipidemia, mixed 09/29/2020   Sumi Lye T  Nicolae Vasek, OTR/L, CLT  Loye Reininger, OT 04/26/2021, 4:23 PM  [facility] [address] [place], [place], [address] Phone: [phone]   Fax:  [phone]  Name: [name] MRN: [id number] Date of Birth: 07/13/1975

## 2021-04-28 ENCOUNTER — Encounter: Payer: Medicaid Other | Admitting: Speech Pathology

## 2021-04-28 ENCOUNTER — Other Ambulatory Visit: Payer: Self-pay

## 2021-04-28 ENCOUNTER — Ambulatory Visit: Payer: Medicaid Other

## 2021-04-28 DIAGNOSIS — R278 Other lack of coordination: Secondary | ICD-10-CM

## 2021-04-28 DIAGNOSIS — R4701 Aphasia: Secondary | ICD-10-CM

## 2021-04-28 DIAGNOSIS — M6281 Muscle weakness (generalized): Secondary | ICD-10-CM

## 2021-04-28 DIAGNOSIS — R41841 Cognitive communication deficit: Secondary | ICD-10-CM

## 2021-04-28 NOTE — Therapy (Signed)
[facility] [address] [place], [place], [address] Phone: [phone]   Fax:  [phone]  Occupational Therapy Treatment  Patient Details  Name: [name] [name] MRN: [id number] Date of Birth: 1976-03-24 Referring Provider (OT): Jacalyn Lefevrehomas, Eunice   Encounter Date: 04/28/2021   OT End of Session - 04/28/21 1352     Visit Number 6    Number of Visits 24    Date for OT Re-Evaluation 06/24/21    Authorization Type Progress reporting period starting 04/01/2021    OT Start Time 1345    OT Stop Time 1430    OT Time Calculation (min) 45 min    Activity Tolerance Patient tolerated treatment well    Behavior During Therapy [facility] for tasks assessed/performed             Past Medical History:  Diagnosis Date   Anemia    history of goiter    Hypertension    Low back pain    Stroke [facility](HCC)     Past Surgical History:  Procedure Laterality Date   CESAREAN SECTION     IR CT HEAD LTD  02/16/2021   IR CT HEAD LTD  02/16/2021   IR CT HEAD LTD  02/16/2021   IR CT HEAD LTD  02/16/2021   IR PERCUTANEOUS ART THROMBECTOMY/INFUSION INTRACRANIAL INC DIAG ANGIO  02/16/2021   RADIOLOGY WITH ANESTHESIA N/A 02/16/2021   Procedure: IR WITH ANESTHESIA;  Surgeon: Julieanne Cottoneveshwar, Sanjeev, MD;  Location: [facility] OR;  Service: Radiology;  Laterality: N/A;   THYROIDECTOMY      There were no vitals filed for this visit.   Subjective Assessment - 04/28/21 1350     Subjective  Pt early to appointment this date, largely answers yes/no. Pt places L hand behind back and asks "like this?"    Patient is accompanied by: Family member    Pertinent History Pt. is a 46 y.o. female who was admitted to the hospital with weakness, and  an inability to speak. Pt. was diagnosed with an Acute Ischemic Left MCA CVA, Left PCA territory Infarcts with mass effect, and additional multiple small acute infarcts in the bilateral hemispheres suggestive of Embolic event.Pt.  underwent cerebral Angio with thrombectomy Left M2 Reocclusion 2B revascularization. Pt/ has Right visual spatial deficits, expressive, and receptive aphasia, aphasia/apraxia. PMHx includes: Hyperlipidemia, slow transit constipation, iron deficiency anemia, HTN, sleep disturbance, Acute on chronic Renal failure. Pt. is a mother of supportive children/family. Pt. works as a LawyerCNA, and enjoys singing, and watching T.V.    Patient Stated Goals Pt indicates she wants to be more independent with tasks and take care of herself.    Currently in Pain? No/denies               Therapeutic Activity  Pt completed tabletop visual scanning ordering 2-10 red suit cards in numerical order, MIN cues to complete. Pt demonstrated recall from prior session stating "like this?" and placing LUE behind back with [facility] cueing [facility] to [facility] activities. Pt then matched black suit 2-10 cards to same numerical red suit card, MOD cues to complete. Noted increased difficulty with extra cards, improved with black/red pairs placed one on top rather than in front of each other. Instructed on simplifying the visual environment to improve scanning for tasks such as meals at home.   Pt completed table top whiteboard design/letter copy activity, successfully copying squares, circles, and 10 letters (a, c, I, j, L, o, s, d, u, t) with  vertical, horizontal, or curved letter formations - unable to form triangles or diagonal letter formations. Pt wrote the 3 letter word "sit" x2 with ~50% legibility, required step by step cueing and multiple trials. Instructed daughter on assisting to practice letter formation / design copy at home.              OT Education - 04/28/21 1351     Education Details visual scanning, sorting cards    Person(s) Educated Patient;Child(ren);Parent(s)    Methods Explanation    Comprehension Verbalized understanding;Verbal cues required;Need further instruction              OT Short Term Goals -  04/01/21 1709       OT SHORT TERM GOAL #1   Title Pt.'s FOTO score will improve by 2 points to reflect functional change.    Baseline Eval: FOTO score: 50, TR score 68    Time 6    Period Weeks    Status New    Target Date 05/13/21      OT SHORT TERM GOAL #2   Title --    Baseline --    Time --    Period --    Status --    Target Date --      OT SHORT TERM GOAL #3   Title --    Baseline --    Time --    Period --    Status --    Target Date --      OT SHORT TERM GOAL #4   Title --    Baseline --    Time --    Period --    Status --    Target Date --      OT SHORT TERM GOAL #5   Title .    Baseline --    Time --    Period --    Status --    Target Date --      Additional Short Term Goals   Additional Short Term Goals --      OT SHORT TERM GOAL #6   Title --    Baseline --    Time --    Period --    Status --      OT SHORT TERM GOAL #7   Title --    Baseline --    Time --    Period --    Status --    Target Date --               OT Long Term Goals - 04/01/21 1734       OT LONG TERM GOAL #1   Title Pt. will independently apply toothpaste to her toothbrush.    Baseline Eval: Pt. is able to brush her teeth, however requires full set-up of toothpaste on her toothbrussh.    Time 12    Period Weeks    Status New    Target Date 06/24/21      OT LONG TERM GOAL #2   Title Pt. will independently, and legibly write her name.    Baseline Eval: 25% legibility with difficulty formulating her name.    Time 12    Period Weeks    Status New    Target Date 06/24/21      OT LONG TERM GOAL #3   Title Pt. will independently fold linens from the dryer.    Baseline Eval: Pt. is unable to perform laundry tasks  Time 12    Period Weeks    Status New    Target Date 07/03/21      OT LONG TERM GOAL #4   Title Pt. will independently make a sandwich.    Baseline Eval: Pt. is unable    Time 12    Period Weeks    Status New    Target Date 07/03/21       OT LONG TERM GOAL #5   Title Pt./caregivers will demonstrate knowledge of visual compensatory strategies for tabletop ADL/IADL tasks.    Baseline Eval: Does not utilize visual compensatory strategies    Time 12    Period Weeks    Status New    Target Date 07/03/21      Long Term Additional Goals   Additional Long Term Goals Yes      OT LONG TERM GOAL #6   Title Pt./caregivers will demonstrate visual compensatory strategies for navigating through her extrapersonal space    Baseline Eval: Does not utilize visual compensatory strategies.    Time 12    Period Weeks    Status New    Target Date 06/24/21                   Plan - 04/28/21 1352     Clinical Impression Statement Pt early to appointment and demonstrates recall from prior session stating "like this?" and placing LUE behind back with no cueing prior to [facility] activities. Pt continues to demonstrate difficulty with cognitive processing during design/letter copy. Improved ability to copy and form letters - correctly copying 11 letters and writing the 3 letter word "sit" x2 with ~50% legibility. Continue to work towards goals in plan of care to maximize safety and independence in necessary daily tasks.    OT Occupational Profile and History Detailed Assessment- Review of Records and additional review of physical, cognitive, psychosocial history related to current functional performance    Occupational performance deficits (Please refer to evaluation for details): ADL's;IADL's    Body Structure / Function / Physical Skills ADL;IADL;FMC;Coordination;Strength;Endurance;UE functional use;Decreased knowledge of use of DME;Pain;Proprioception    Rehab Potential Good    Clinical Decision Making Several treatment options, min-mod task modification necessary    Comorbidities Affecting Occupational Performance: May have comorbidities impacting occupational performance    Modification or Assistance to Complete Evaluation   Min-Moderate modification of tasks or assist with assess necessary to complete eval    OT Frequency 2x / week    OT Duration 12 weeks    OT Treatment/Interventions Self-care/ADL training;Therapeutic exercise;Neuromuscular education;Therapeutic activities;Patient/family education;DME and/or AE instruction;Cognitive remediation/compensation;Visual/perceptual remediation/compensation;Energy conservation    Consulted and Agree with Plan of Care Patient;Family member/caregiver    Family Member Consulted Daughter             Patient will benefit from skilled therapeutic intervention in order to improve the following deficits and impairments:   Body Structure / Function / Physical Skills: ADL, IADL, FMC, Coordination, Strength, Endurance, UE functional use, Decreased knowledge of use of DME, Pain, Proprioception       Visit Diagnosis: Muscle weakness (generalized)  Cognitive communication deficit  Other lack of coordination  Aphasia    Problem List Patient Active Problem List   Diagnosis Date Noted   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) 03/05/2021   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 03/05/2021   Sleep disturbance 03/05/2021   Acute on chronic renal failure (HCC) 03/05/2021   Slow transit constipation    Iron deficiency anemia  Benign essential HTN    Acute ischemic left middle cerebral artery (MCA) stroke (HCC) 02/21/2021   Stroke (cerebrum) (HCC) 02/16/2021   Middle cerebral artery embolism, left 02/16/2021   Endotracheally intubated    Hypertensive urgency 09/29/2020   History of ischemic left MCA stroke 09/29/2020   Basal ganglia stroke (HCC) 09/29/2020   Malignant hypertension 09/29/2020   Obesity, Class III, BMI 40-49.9 (morbid obesity) (HCC) 09/29/2020   Hyperlipidemia, mixed 09/29/2020    Kathie Dikeevin Gurshan Settlemire, M.S. OTR/L  04/28/21, 3:34 PM  ascom [phone]   [place]IN [facility] [address] [place],  [place], [address] Phone: [phone]   Fax:  [phone]  Name: Erynn Molina Kimberly Haack MRN: [id number] Date of Birth: 17-Apr-1975

## 2021-04-30 ENCOUNTER — Ambulatory Visit: Payer: Medicaid Other | Admitting: Speech Pathology

## 2021-05-02 ENCOUNTER — Encounter: Payer: Self-pay | Admitting: Physical Medicine & Rehabilitation

## 2021-05-02 ENCOUNTER — Other Ambulatory Visit: Payer: Self-pay

## 2021-05-02 ENCOUNTER — Encounter: Payer: Medicaid Other | Attending: Registered Nurse | Admitting: Physical Medicine & Rehabilitation

## 2021-05-02 VITALS — BP 137/84 | HR 60 | Temp 98.3°F | Ht 65.0 in | Wt 268.4 lb

## 2021-05-02 DIAGNOSIS — I69990 Apraxia following unspecified cerebrovascular disease: Secondary | ICD-10-CM | POA: Insufficient documentation

## 2021-05-02 DIAGNOSIS — I69920 Aphasia following unspecified cerebrovascular disease: Secondary | ICD-10-CM | POA: Insufficient documentation

## 2021-05-02 NOTE — Progress Notes (Signed)
Subjective:    Patient ID: [name], female    DOB: 10/29/1975, 46 y.o.   MRN: [id number] 46 y.o. female female with history of HTN, multiple prior CVA was admitted via [facility] on 11/06 22 with right-sided weakness and inability to speak.  CTA/perfusion head was done showing perfusion mismatch with ischemia in left parietal region and M2 occlusion.  She was transferred to [facility] and underwent cerebral angio with thrombectomy of left M2 reocclusion and 2B revascularization.  MRI brain done showing acute large left MCA and left PCA territory infarct with mild mass-effect and additional multiple small acute infarcts in bilateral cerebral hemispheres suggestive of embolic event.  2D echo showed EF of 60 to 65% with severely dilated LA and mild dilatation of aorta approximately 37 mm.  Dr. Leonie Man felt the stroke was embolic secondary to intracranial left MCA atherosclerosis and recommended DAPT x3 months.  She has had issues with hypokalemia requiring runs of K.  She was tolerating dysphagia 3 diet and verbal output was improving.  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    Admit date: 02/21/2021 Discharge date: 03/05/2021 HPI 46 year old female with history of recurrent strokes.  She was in rehab following most recent stroke affecting left MCA distribution.  She mainly has residual aphasia and apraxia.  Motor strength has improved over time.  She is going to outpatient PT OT speech.  This is at [facility].  She has followed up with neurology as well.  She is here with her daughter.  The patient has not driving.  Walking ok no falls Mod I dressing and bathing Needs help with cooking and house work   Pain Inventory Average Pain 0 Pain Right Now 0 My pain is  no pain  In the last 24 hours, has pain interfered with the following? General activity 0 Relation with others 0 Enjoyment of life 0 What TIME of day is  your pain at its worst? No pain Sleep (in general) Good  Pain is worse with:  no pain Pain improves with:  no pain Relief from Meds:  no pain med  Family History  Problem Relation Age of Onset   Healthy Mother    Social History   Socioeconomic History   Marital status: Divorced    Spouse name: Not on file   Number of children: 3   Years of education: Not on file   Highest education level: Not on file  Occupational History   Not on file  Tobacco Use   Smoking status: Never    Passive exposure: Past   Smokeless tobacco: Never  Vaping Use   Vaping Use: Never used  Substance and Sexual Activity   Alcohol use: Yes    Comment: occassionally   Drug use: Yes    Types: Marijuana    Comment: occassionally   Sexual activity: Not Currently  Other Topics Concern   Not on file  Social History Narrative   Not on file   Social Determinants of Health   Financial Resource Strain: Not on file  Food Insecurity: Not on file  Transportation Needs: Not on file  Physical Activity: Not on file  Stress: Not on file  Social Connections: Not on file   Past Surgical History:  Procedure Laterality Date   CESAREAN SECTION     IR CT HEAD LTD  02/16/2021   IR CT HEAD LTD  02/16/2021   IR CT HEAD LTD  02/16/2021   IR CT HEAD LTD  02/16/2021   IR PERCUTANEOUS ART THROMBECTOMY/INFUSION INTRACRANIAL INC DIAG ANGIO  02/16/2021   RADIOLOGY WITH ANESTHESIA N/A 02/16/2021   Procedure: IR WITH ANESTHESIA;  Surgeon: Luanne Bras, MD;  Location: [place];  Service: Radiology;  Laterality: N/A;   THYROIDECTOMY     Past Surgical History:  Procedure Laterality Date   CESAREAN SECTION     IR CT HEAD LTD  02/16/2021   IR CT HEAD LTD  02/16/2021   IR CT HEAD LTD  02/16/2021   IR CT HEAD LTD  02/16/2021   IR PERCUTANEOUS ART THROMBECTOMY/INFUSION INTRACRANIAL INC DIAG ANGIO  02/16/2021   RADIOLOGY WITH ANESTHESIA N/A 02/16/2021   Procedure: IR WITH ANESTHESIA;  Surgeon: Luanne Bras, MD;  Location: [place];  Service: Radiology;  Laterality: N/A;   THYROIDECTOMY     Past Medical History:  Diagnosis Date   Anemia    history of goiter    Hypertension    Low back pain    Stroke (HCC)    BP 137/84    Pulse 60    Temp 98.3 F (36.8 C)    Ht 5\' 5"  (1.651 m)    Wt 268 lb 6.4 oz (121.7 kg)    SpO2 98%    BMI 44.66 kg/m   Opioid Risk Score:   Fall Risk Score:  `1  Depression screen PHQ 2/9  Depression screen [facility] 2/9 05/02/2021 03/27/2021  Decreased Interest 0 0  Down, Depressed, Hopeless 0 0  PHQ - 2 Score 0 0  Altered sleeping - 0  Tired, decreased energy - 0  Change in appetite - 0  Feeling bad or failure about yourself  - 0  Trouble concentrating - (No Data)  Moving slowly or fidgety/restless - (No Data)  Suicidal thoughts - 0  PHQ-9 Score - 0     Review of Systems  Constitutional: Negative.   HENT: Negative.    Eyes: Negative.   Respiratory: Negative.    Cardiovascular: Negative.   Gastrointestinal: Negative.   Endocrine: Negative.   Genitourinary: Negative.   Musculoskeletal:  Positive for gait problem.  Skin: Negative.   Allergic/Immunologic: Negative.   Hematological:  Bruises/bleeds easily.       Plavix  Psychiatric/Behavioral: Negative.    All other systems reviewed and are negative.     Objective:   Physical Exam Vitals and nursing note reviewed.  Constitutional:      Appearance: She is obese.  HENT:     Head: Normocephalic and atraumatic.  Eyes:     Extraocular Movements: Extraocular movements intact.     Conjunctiva/sclera: Conjunctivae normal.     Pupils: Pupils are equal, round, and reactive to light.  Neurological:     Mental Status: She is alert.     Cranial Nerves: No facial asymmetry.     Motor: No tremor or abnormal muscle tone.     Gait: Gait is intact.     Comments: Motor strength is 5/5 bilateral deltoid, bicep, tricep, grip, hip flexor, knee extensor, ankle dorsiflexor plantar flexor Sensation unable to assess secondary to severe  aphasia. Severe expressive aphasia unable to name simple objects even with phonemic cues. The patient also has difficulty with manual muscle testing, multistep commands from a motor standpoint.  He is able to follow simple gestures.  Psychiatric:        Mood and Affect: Mood normal.        Behavior: Behavior normal.  Assessment & Plan:  #1.  Left M2 infarct with aphasia, speech and ideational apraxia. Continue outpatient PT OT speech PM&R follow-up in 6 months New driving no driving Discussed home exercise program for her speech issues.  Stroke prevention print out

## 2021-05-02 NOTE — Patient Instructions (Signed)
Stroke Prevention Some medical conditions and lifestyle choices can lead to a higher risk for a stroke. You can help to prevent a stroke by eating healthy foods and exercising. It also helps to not smoke and to manage any health problems youmay have. How can this condition affect me? A stroke is an emergency. It should be treated right away. A stroke can lead to brain damage or threaten your life. There is a better chance of surviving andgetting better after a stroke if you get medical help right away. What can increase my risk? The following medical conditions may increase your risk of a stroke: Diseases of the heart and blood vessels (cardiovascular disease). High blood pressure (hypertension). Diabetes. High cholesterol. Sickle cell disease. Problems with blood clotting. Being very overweight. Sleeping problems (obstructivesleep apnea). Other risk factors include: Being older than age 60. A history of blood clots, stroke, or mini-stroke (TIA). Race, ethnic background, or a family history of stroke. Smoking or using tobacco products. Taking birth control pills, especially if you smoke. Heavy alcohol and drug use. Not being active. What actions can I take to prevent this? Manage your health conditions High cholesterol. Eat a healthy diet. If this is not enough to manage your cholesterol, you may need to take medicines. Take medicines as told by your doctor. High blood pressure. Try to keep your blood pressure below 130/80. If your blood pressure cannot be managed through a healthy diet and regular exercise, you may need to take medicines. Take medicines as told by your doctor. Ask your doctor if you should check your blood pressure at home. Have your blood pressure checked every year. Diabetes. Eat a healthy diet and get regular exercise. If your blood sugar (glucose) cannot be managed through diet and exercise, you may need to take medicines. Take medicines as told by your  doctor. Talk to your doctor about getting checked for sleeping problems. Signs of a problem can include: Snoring a lot. Feeling very tired. Make sure that you manage any other conditions you have. Nutrition  Follow instructions from your doctor about what to eat or drink. You may be told to: Eat and drink fewer calories each day. Limit how much salt (sodium) you use to 1,500 milligrams (mg) each day. Use only healthy fats for cooking, such as olive oil, canola oil, and sunflower oil. Eat healthy foods. To do this: Choose foods that are high in fiber. These include whole grains, and fresh fruits and vegetables. Eat at least 5 servings of fruits and vegetables a day. Try to fill one-half of your plate with fruits and vegetables at each meal. Choose low-fat (lean) proteins. These include low-fat cuts of meat, chicken without skin, fish, tofu, beans, and nuts. Eat low-fat dairy products. Avoid foods that: Are high in salt. Have saturated fat. Have trans fat. Have cholesterol. Are processed or pre-made. Count how many carbohydrates you eat and drink each day.  Lifestyle If you drink alcohol: Limit how much you have to: 0-1 drink a day for women who are not pregnant. 0-2 drinks a day for men. Know how much alcohol is in your drink. In the [place] one drink equals one 12 oz bottle of beer (355mL), one 5 oz glass of wine (148mL), or one 1 oz glass of hard liquor (44mL). Do not smoke or use any products that have nicotine or tobacco. If you need help quitting, ask your doctor. Avoid secondhand smoke. Do not use drugs. Activity  Try to stay at a healthy   weight. Get at least 30 minutes of exercise on most days, such as: Fast walking. Biking. Swimming.  Medicines Take over-the-counter and prescription medicines only as told by your doctor. Avoid taking birth control pills. Talk to your doctor about the risks of taking birth control pills if: You are over 35 years old. You smoke. You  get very bad headaches. You have had a blood clot. Where to find more information American Stroke Association: www.strokeassociation.org Get help right away if: You or a loved one has any signs of a stroke. "BE FAST" is an easy way to remember the warning signs: B - Balance. Dizziness, sudden trouble walking, or loss of balance. E - Eyes. Trouble seeing or a change in how you see. F - Face. Sudden weakness or loss of feeling of the face. The face or eyelid may droop on one side. A - Arms. Weakness or loss of feeling in an arm. This happens all of a sudden and most often on one side of the body. S - Speech. Sudden trouble speaking, slurred speech, or trouble understanding what people say. T - Time. Time to call emergency services. Write down what time symptoms started. You or a loved one has other signs of a stroke, such as: A sudden, very bad headache with no known cause. Feeling like you may vomit (nausea). Vomiting. A seizure. These symptoms may be an emergency. Get help right away. Call your local emergency services (911 in the [place] Do not wait to see if the symptoms will go away. Do not drive yourself to the hospital. Summary You can help to prevent a stroke by eating healthy, exercising, and not smoking. It also helps to manage any health problems you have. Do not smoke or use any products that contain nicotine or tobacco. Get help right away if you or a loved one has any signs of a stroke. This information is not intended to replace advice given to you by your health care provider. Make sure you discuss any questions you have with your healthcare provider. Document Revised: 10/30/2019 Document Reviewed: 10/30/2019 Elsevier Patient Education  2022 Elsevier Inc.  

## 2021-05-05 ENCOUNTER — Other Ambulatory Visit: Payer: Self-pay

## 2021-05-05 ENCOUNTER — Ambulatory Visit: Payer: Medicaid Other

## 2021-05-05 ENCOUNTER — Ambulatory Visit: Payer: Medicaid Other | Admitting: Speech Pathology

## 2021-05-05 DIAGNOSIS — I69390 Apraxia following cerebral infarction: Secondary | ICD-10-CM

## 2021-05-05 DIAGNOSIS — R482 Apraxia: Secondary | ICD-10-CM

## 2021-05-05 DIAGNOSIS — R4701 Aphasia: Secondary | ICD-10-CM

## 2021-05-05 DIAGNOSIS — I6381 Other cerebral infarction due to occlusion or stenosis of small artery: Secondary | ICD-10-CM

## 2021-05-05 DIAGNOSIS — M6281 Muscle weakness (generalized): Secondary | ICD-10-CM | POA: Diagnosis not present

## 2021-05-05 DIAGNOSIS — H5410 Blindness, one eye, low vision other eye, unspecified eyes: Secondary | ICD-10-CM

## 2021-05-05 DIAGNOSIS — R278 Other lack of coordination: Secondary | ICD-10-CM

## 2021-05-06 NOTE — Therapy (Signed)
[facility] [address] [place], [place], [address] Phone: [phone]   Fax:  [phone]  Occupational Therapy Treatment  Patient Details  Name: Kimberly Rockwell AlexandriaDiane Molina MRN: [id number] Date of Birth: 1975-09-24 Referring Provider (OT): Jacalyn Lefevrehomas, Eunice   Encounter Date: 05/05/2021   OT End of Session - 05/06/21 0831     Visit Number 7    Number of Visits 24    Date for OT Re-Evaluation 06/24/21    Authorization Type Progress reporting period starting 04/01/2021    OT Start Time 1600    OT Stop Time 1645    OT Time Calculation (min) 45 min    Activity Tolerance Patient tolerated treatment well    Behavior During Therapy [facility] for tasks assessed/performed             Past Medical History:  Diagnosis Date   Anemia    history of goiter    Hypertension    Low back pain    Stroke [facility](HCC)     Past Surgical History:  Procedure Laterality Date   CESAREAN SECTION     IR CT HEAD LTD  02/16/2021   IR CT HEAD LTD  02/16/2021   IR CT HEAD LTD  02/16/2021   IR CT HEAD LTD  02/16/2021   IR PERCUTANEOUS ART THROMBECTOMY/INFUSION INTRACRANIAL INC DIAG ANGIO  02/16/2021   RADIOLOGY WITH ANESTHESIA N/A 02/16/2021   Procedure: IR WITH ANESTHESIA;  Surgeon: Julieanne Cottoneveshwar, Sanjeev, MD;  [facility];  Service: Radiology;  Laterality: N/A;   THYROIDECTOMY      There were no vitals filed for this visit.   Subjective Assessment - 05/05/21 0835     Subjective  Daughter reports pt is bathing and dressing self, but still requires assist to put in earring studs both ears.    Patient is accompanied by: Family member    Pertinent History Pt. is a 46 y.o. female who was admitted to the hospital with weakness, and  an inability to speak. Pt. was diagnosed with an Acute Ischemic Left MCA CVA, Left PCA territory Infarcts with mass effect, and additional multiple small acute infarcts in the bilateral hemispheres suggestive of Embolic event.Pt.  underwent cerebral Angio with thrombectomy Left M2 Reocclusion 2B revascularization. Pt/ has Right visual spatial deficits, expressive, and receptive aphasia, aphasia/apraxia. PMHx includes: Hyperlipidemia, slow transit constipation, iron deficiency anemia, HTN, sleep disturbance, Acute on chronic Renal failure. Pt. is a mother of supportive children/family. Pt. works as a LawyerCNA, and enjoys singing, and watching T.V.    Patient Stated Goals Pt indicates she wants to be more independent with tasks and take care of herself.    Currently in Pain? No/denies    Pain Score 0-No pain            Occupational Therapy Treatment: Self Care: Practiced securing various types of earring backs to end of paper clip to simulate securing earring studs with backs.  Pt required vc and tactile cues for positioning earring back between fingertips to locate the hole and line up paper clip end in the correct direction (vertical vs horizontal, depending on how the back was positioned in pt's fingertips).  Pt was able to secure larger circular back x3 with multiple attempts, and smaller metal back x3 with increased difficulty and attempts.  Not yet able to manage the smaller rubber backs.    Therapeutic Activity: Facilitated visual scanning to copy a simple design on small pegboard, requiring max vc for visual scanning  patterns, and mod A to complete accurately.  Practiced sorting small pegs by color to further promote visual scanning L<>R at table top, requiring mod vc to ensure scanning entire washcloth where colored pegs were sorted into groups.   Response to Treatment: Daughter reports that pt is now able to walk around independently in their home without hand held assist, and also reports improvements with head turns to visually can environment.  Per daughter, pt is now bathing and dressing self, but struggles with securing earrings into earring backs.  Practiced today with various types of backs; pt is inconsistent and  requires significant cueing as noted above.  Pt continues to develop visual processing skills.  Will continue to address deficits noted above in order to maximize indep with daily tasks.    OT Education - 05/05/21 0829     Education Details visual processing activities    Person(s) Educated Patient;Child(ren)    Methods Explanation;Demonstration    Comprehension Verbalized understanding;Verbal cues required;Need further instruction;Returned demonstration;Tactile cues required              OT Short Term Goals - 04/01/21 1709       OT SHORT TERM GOAL #1   Title Pt.'s FOTO score will improve by 2 points to reflect functional change.    Baseline Eval: FOTO score: 50, TR score 68    Time 6    Period Weeks    Status New    Target Date 05/13/21      OT SHORT TERM GOAL #2   Title --    Baseline --    Time --    Period --    Status --    Target Date --      OT SHORT TERM GOAL #3   Title --    Baseline --    Time --    Period --    Status --    Target Date --      OT SHORT TERM GOAL #4   Title --    Baseline --    Time --    Period --    Status --    Target Date --      OT SHORT TERM GOAL #5   Title .    Baseline --    Time --    Period --    Status --    Target Date --      Additional Short Term Goals   Additional Short Term Goals --      OT SHORT TERM GOAL #6   Title --    Baseline --    Time --    Period --    Status --      OT SHORT TERM GOAL #7   Title --    Baseline --    Time --    Period --    Status --    Target Date --               OT Long Term Goals - 04/01/21 1734       OT LONG TERM GOAL #1   Title Pt. will independently apply toothpaste to her toothbrush.    Baseline Eval: Pt. is able to brush her teeth, however requires full set-up of toothpaste on her toothbrussh.    Time 12    Period Weeks    Status New    Target Date 06/24/21      OT LONG TERM GOAL #2   Title  Pt. will independently, and legibly write her name.     Baseline Eval: 25% legibility with difficulty formulating her name.    Time 12    Period Weeks    Status New    Target Date 06/24/21      OT LONG TERM GOAL #3   Title Pt. will independently fold linens from the dryer.    Baseline Eval: Pt. is unable to perform laundry tasks    Time 12    Period Weeks    Status New    Target Date 07/03/21      OT LONG TERM GOAL #4   Title Pt. will independently make a sandwich.    Baseline Eval: Pt. is unable    Time 12    Period Weeks    Status New    Target Date 07/03/21      OT LONG TERM GOAL #5   Title Pt./caregivers will demonstrate knowledge of visual compensatory strategies for tabletop ADL/IADL tasks.    Baseline Eval: Does not utilize visual compensatory strategies    Time 12    Period Weeks    Status New    Target Date 07/03/21      Long Term Additional Goals   Additional Long Term Goals Yes      OT LONG TERM GOAL #6   Title Pt./caregivers will demonstrate visual compensatory strategies for navigating through her extrapersonal space    Baseline Eval: Does not utilize visual compensatory strategies.    Time 12    Period Weeks    Status New    Target Date 06/24/21              Plan - 05/05/21 0848     Clinical Impression Statement Daughter reports that pt is now able to walk around independently in their home without hand held assist, and also reports improvements with head turns to visually can environment.  Per daughter, pt is now bathing and dressing self, but struggles with securing earrings into earring backs.  Practiced today with various types of backs; pt is inconsistent and requires significant cueing as noted above.  Pt continues to develop visual processing skills.  Will continue to address deficits noted above in order to maximize indep with daily tasks.    OT Occupational Profile and History Detailed Assessment- Review of Records and additional review of physical, cognitive, psychosocial history related to current  functional performance    Occupational performance deficits (Please refer to evaluation for details): ADL's;IADL's    Body Structure / Function / Physical Skills ADL;IADL;FMC;Coordination;Strength;Endurance;UE functional use;Decreased knowledge of use of DME;Pain;Proprioception    Rehab Potential Good    Clinical Decision Making Several treatment options, min-mod task modification necessary    Comorbidities Affecting Occupational Performance: May have comorbidities impacting occupational performance    Modification or Assistance to Complete Evaluation  Min-Moderate modification of tasks or assist with assess necessary to complete eval    OT Frequency 2x / week    OT Duration 12 weeks    OT Treatment/Interventions Self-care/ADL training;Therapeutic exercise;Neuromuscular education;Therapeutic activities;Patient/family education;DME and/or AE instruction;Cognitive remediation/compensation;Visual/perceptual remediation/compensation;Energy conservation    Consulted and Agree with Plan of Care Patient;Family member/caregiver    Family Member Consulted Daughter             Patient will benefit from skilled therapeutic intervention in order to improve the following deficits and impairments:   Body Structure / Function / Physical Skills: ADL, IADL, FMC, Coordination, Strength, Endurance, UE functional use, Decreased knowledge of use  of DME, Pain, Proprioception       Visit Diagnosis: Blindness, one eye, low vision other eye, unspecified eyes  Other lack of coordination    Problem List Patient Active Problem List   Diagnosis Date Noted   Aphasia, late effect of cerebrovascular disease 03/05/2021   Apraxia, late effect of cerebrovascular disease 03/05/2021   Sleep disturbance 03/05/2021   Acute on chronic renal failure (HCC) 03/05/2021   Slow transit constipation    Iron deficiency anemia    Benign essential HTN    Acute ischemic left middle cerebral artery (MCA) stroke (HCC)  02/21/2021   Stroke (cerebrum) (HCC) 02/16/2021   Middle cerebral artery embolism, left 02/16/2021   Endotracheally intubated    Hypertensive urgency 09/29/2020   History of ischemic left MCA stroke 09/29/2020   Basal ganglia stroke (HCC) 09/29/2020   Malignant hypertension 09/29/2020   Obesity, Class III, BMI 40-49.9 (morbid obesity) (HCC) 09/29/2020   Hyperlipidemia, mixed 09/29/2020   Danelle EarthlyNancy Andreanna Mikolajczak, MS, OTR/L  Otis DialsNancy K Siddhant Hashemi, OT 05/06/2021, 8:48 AM  [facility] [address] [place], [place], [address] Phone: [phone]   Fax:  [phone]  Name: Anyla Rockwell AlexandriaDiane Teague MRN: [id number] Date of Birth: February 12, 1976

## 2021-05-07 ENCOUNTER — Ambulatory Visit: Payer: Medicaid Other

## 2021-05-07 ENCOUNTER — Ambulatory Visit: Payer: Medicaid Other | Admitting: Speech Pathology

## 2021-05-07 ENCOUNTER — Other Ambulatory Visit: Payer: Self-pay

## 2021-05-07 DIAGNOSIS — I69390 Apraxia following cerebral infarction: Secondary | ICD-10-CM

## 2021-05-07 DIAGNOSIS — I6381 Other cerebral infarction due to occlusion or stenosis of small artery: Secondary | ICD-10-CM

## 2021-05-07 DIAGNOSIS — R4701 Aphasia: Secondary | ICD-10-CM

## 2021-05-07 DIAGNOSIS — R482 Apraxia: Secondary | ICD-10-CM

## 2021-05-07 DIAGNOSIS — M6281 Muscle weakness (generalized): Secondary | ICD-10-CM | POA: Diagnosis not present

## 2021-05-07 NOTE — Therapy (Signed)
[place] MAIN [facility] SERVICES [address] [place], [place], [address] Phone: [phone]   Fax:  [phone]  Speech Language Pathology Treatment  Patient Details  Name: [name] MRN: [id number] Date of Birth: 11-28-1975 Referring Provider (SLP): Danella Sensing   Encounter Date: 05/05/2021   End of Session - 05/07/21 0700     Visit Number 4    Number of Visits 25    Date for SLP Re-Evaluation 06/24/21    Authorization Type [place] Medicaid Prepaid Health Plan    Authorization Time Period 04/01/2021 thru 06/24/2021    Authorization - Visit Number 3    Authorization - Number of Visits 12    Progress Note Due on Visit 10    SLP Start Time 1500    SLP Stop Time  1600    SLP Time Calculation (min) 60 min    Activity Tolerance Patient tolerated treatment well             Past Medical History:  Diagnosis Date   Anemia    history of goiter    Hypertension    Low back pain    Stroke [facility])     Past Surgical History:  Procedure Laterality Date   CESAREAN SECTION     IR CT HEAD LTD  02/16/2021   IR CT HEAD LTD  02/16/2021   IR CT HEAD LTD  02/16/2021   IR CT HEAD LTD  02/16/2021   IR PERCUTANEOUS ART THROMBECTOMY/INFUSION INTRACRANIAL INC DIAG ANGIO  02/16/2021   RADIOLOGY WITH ANESTHESIA N/A 02/16/2021   Procedure: IR WITH ANESTHESIA;  Surgeon: Luanne Bras, MD;  Location: [place];  Service: Radiology;  Laterality: N/A;   THYROIDECTOMY      There were no vitals filed for this visit.   Subjective Assessment - 05/07/21 0657     Subjective pt pleasant, eager to interact with SGD    Patient is accompained by: Family member    Currently in Pain? No/denies                   ADULT SLP TREATMENT - 05/07/21 0001       Treatment Provided   Treatment provided Cognitive-Linquistic      Cognitive-Linquistic Treatment   Treatment focused on Aphasia;Apraxia;Patient/family/caregiver education    Skilled Treatment  Pt and her daughter assisted in creating icons related to restaurants, stores, family. Pt required maximal to moderate cues to locate icons in medial position in field of 3. When using SGD to demonstrate receptive language abilities, when asked to label pictures on her SGD, when looking at a picture of her mother pt commented, "that's my mama." This has been the first time that pt has expressed these words/comments.              SLP Education - 05/07/21 [phone]     Education Details SGD, initial steps to implementing an alternative form of communication    Person(s) Educated Patient;Child(ren)    Methods Explanation;Demonstration;Verbal cues;Handout    Comprehension Verbalized understanding;Need further instruction              SLP Short Term Goals - 04/01/21 1321       SLP SHORT TERM GOAL #1   Title Pt will complete SGD assessment.    Baseline new goal    Time 10    Period --   sessions   Status New      SLP SHORT TERM GOAL #2   Title With minimal  assistance, pt will match picture with object in field of 3 with 75% accuracy across 5 sessions.    Baseline 65% with moderate assistance    Time 10    Period --   sessions   Status New      SLP SHORT TERM GOAL #3   Title Pt will imitate set of meaningful vocalizations in 3 out of 5 opportunties during structured speech/language tasks with mod A multimodal cues    Baseline maximal assistance for 1 out of 5 opportunities    Time 10    Period --   sessions   Status New              SLP Long Term Goals - 04/01/21 1327       SLP LONG TERM GOAL #1   Title Using an SGD, pt will indicate 1 need in 3 out of 5 opportunities with communication partners.    Baseline inability to communicate basic wants/needs with multimodal means    Time 12    Period Weeks    Status New    Target Date 06/24/21              Plan - 05/07/21 0700     Clinical Impression Statement Initial steps to establishing an alternative  communication system - instruction provided on creating a communicative relationship between pictured images and message    Speech Therapy Frequency 2x / week    Duration 12 weeks    Treatment/Interventions SLP instruction and feedback;Patient/family education;Functional tasks;Compensatory techniques;Language facilitation;Environmental controls;Multimodal communcation approach;Compensatory strategies;Internal/external aids;Cueing hierarchy    Potential to Achieve Goals Fair    Potential Considerations Severity of impairments    Consulted and Agree with Plan of Care Patient;Family member/caregiver    Family Member Consulted pt's daughter             Patient will benefit from skilled therapeutic intervention in order to improve the following deficits and impairments:   Aphasia  Apraxia  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 [facility])  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  Basal ganglia stroke [facility])    Problem List Patient Active Problem List   Diagnosis Date Noted   Aphasia, late effect of cerebrovascular disease 03/05/2021   Apraxia, late effect of cerebrovascular disease 03/05/2021   Sleep disturbance 03/05/2021   Acute on chronic renal failure ([place]) 03/05/2021   Slow transit constipation    Iron deficiency anemia    Benign essential HTN    Acute ischemic left middle cerebral artery (MCA) stroke ([place]) 02/21/2021   Stroke (cerebrum) ([place]) 02/16/2021   Middle cerebral artery embolism, left 02/16/2021   Endotracheally intubated    Hypertensive urgency 09/29/2020   History of ischemic left MCA stroke 09/29/2020   Basal ganglia stroke ([place]) 09/29/2020   Malignant hypertension 09/29/2020   Obesity, Class III, BMI 40-49.9 (morbid obesity) ([place]) 09/29/2020   Hyperlipidemia, mixed 09/29/2020   Winfrey Chillemi B. Rutherford Nail M.S., CCC-SLP, [facility] Pathologist Rehabilitation Services Office [phone], [place] 05/07/2021, 7:19 AM  [place] MAIN [facility] SERVICES [address] [place], [place], [address] Phone: [phone]   Fax:  [phone]   Name: [name] MRN: [id number] Date of Birth: 10-18-1975

## 2021-05-07 NOTE — Therapy (Signed)
Pt arrived but no treatment charge.  OT collaborated with SLP, PT, pt, daughter, and pt's mother and decision was made to hold OT visits until February to allow pt to focus time on language and communication skills with SLP.  Pt is currently managing basic self care with supv from family.  Pt presents with significant visual deficits, but pt and family are utilizing compensatory strategies well for scanning environment.  OT will plan to reassess visual abilities closer to the end of pt's allotted 27 visits, with potential for improved communication at that time which would allow pt to maximize benefits from an OT session.  All persons noted above in agreement with plan.    Quick screen completed for pt's ability to negotiate stairs.  Pt was able to manage a full flight up/down the stairs using hand rail and min guard without difficulty.  Pt does not have stairs that she negotiates at home but family is aware of recommendation for close supv/min guard and use of rail when available to reduce fall risk d/t visual impairments.   Leta Speller, MS, OTR/L

## 2021-05-08 NOTE — Therapy (Signed)
[place] [facility] MAIN [facility] SERVICES [address] [place], [place], [address] Phone: [phone]   Fax:  [phone]  Speech Language Pathology Treatment  Patient Details  Name: [name] MRN: [id number] Date of Birth: 1976-03-01 Referring Provider (SLP): Jacalyn Lefevre   Encounter Date: 05/07/2021   End of Session - 05/08/21 1249     Visit Number 5    Number of Visits 25    Date for SLP Re-Evaluation 06/24/21    Authorization Type [place] Medicaid Prepaid Health Plan    Authorization Time Period 04/01/2021 thru 06/24/2021    Authorization - Visit Number 4    Authorization - Number of Visits 12    Progress Note Due on Visit 10    SLP Start Time 1500    SLP Stop Time  1630    SLP Time Calculation (min) 90 min    Activity Tolerance Patient tolerated treatment well             Past Medical History:  Diagnosis Date   Anemia    history of goiter    Hypertension    Low back pain    Stroke [facility])     Past Surgical History:  Procedure Laterality Date   CESAREAN SECTION     IR CT HEAD LTD  02/16/2021   IR CT HEAD LTD  02/16/2021   IR CT HEAD LTD  02/16/2021   IR CT HEAD LTD  02/16/2021   IR PERCUTANEOUS ART THROMBECTOMY/INFUSION INTRACRANIAL INC DIAG ANGIO  02/16/2021   RADIOLOGY WITH ANESTHESIA N/A 02/16/2021   Procedure: IR WITH ANESTHESIA;  Surgeon: Julieanne Cotton, MD;  [facility];  Service: Radiology;  Laterality: N/A;   THYROIDECTOMY      There were no vitals filed for this visit.   Subjective Assessment - 05/08/21 1247     Subjective pt pleasant, eager to interact with SGD    Patient is accompained by: Family member    Currently in Pain? No/denies                   ADULT SLP TREATMENT - 05/08/21 0001       Treatment Provided   Treatment provided Cognitive-Linquistic      Cognitive-Linquistic Treatment   Treatment focused on Aphasia;Apraxia;Patient/family/caregiver education    Skilled Treatment  Skilled treatment session focused on training SGD and using SGD to answer auditory questions as well as promote expressive communication.   Pt assisted SLP in selecting icons for communication of wants/needs. The following patient centered icons were added: FOOD with options - grits, scrambled eggs, bacon, water, hot chocolate, Pepsi;  TV with options - Justice Rocher, Girlfriends; HELP ME FIND with options - earrings, ring; SHOPPING with options [employer] [place]; RESTAURANTS with options Mindi Slicker, McDonald's  SGD utilization: RECEPTIVE LANGUAGE - pt identified and appropriately selected the following pictures in field of 3 given auditory word: fruits 4 out of 6 with self-correcting x 2; desserts 6 out 6; numbers 1-10 - 100%; pt answered the following auditory questions using SGD with 100% accuracy: What is your favorite dessert? What do you put on an ice cream sundae? SPEECH LANGUAGE: - using sentence completion by able to state her mother's name [name]), her daughter's name [name]) but demonstrated difficulty breaking motor pattern to produce her son and other daughter's name.   Instructed pt's daughter in auditory and visual bombardment using SGD during activities to promote relationship of single action icon to item, specifically instructed to allow pt  to select between choices such as TV shows. To increase visual scanning, more preferred icons were placed in position 2 in field of 3.   Pt able to verbally count 1-10 with hand over hand support from SLP while counting quarters even when verbal model was faded.   Pt required visual cues and occasional physical assistance to form hand into fist in order to use knuckles to make selections with as she has long fingernails that impede using her finger to select.   30 minutes nonbillable time (1300-1330) were spent in collaboration with pt, her daughter, pt's mother via phone and OT discussing pt's current level of function, restricted number of sessions and  developed plan to maximize progress by focusing on ST services with OT to be placed on hold for several weeks. OT also provided education to this therapist on to continue promoting fine motor skills in ST sessions.                SLP Education - 05/08/21 1248     Education Details SGD, ways to promote increased language use (sentence completion)    Person(s) Educated Patient;Child(ren)    Methods Explanation;Demonstration;Verbal cues;Handout    Comprehension Verbalized understanding;Returned demonstration;Need further instruction              SLP Short Term Goals - 04/01/21 1321       SLP SHORT TERM GOAL #1   Title Pt will complete SGD assessment.    Baseline new goal    Time 10    Period --   sessions   Status New      SLP SHORT TERM GOAL #2   Title With minimal assistance, pt will match picture with object in field of 3 with 75% accuracy across 5 sessions.    Baseline 65% with moderate assistance    Time 10    Period --   sessions   Status New      SLP SHORT TERM GOAL #3   Title Pt will imitate set of meaningful vocalizations in 3 out of 5 opportunties during structured speech/language tasks with mod A multimodal cues    Baseline maximal assistance for 1 out of 5 opportunities    Time 10    Period --   sessions   Status New              SLP Long Term Goals - 04/01/21 1327       SLP LONG TERM GOAL #1   Title Using an SGD, pt will indicate 1 need in 3 out of 5 opportunities with communication partners.    Baseline inability to communicate basic wants/needs with multimodal means    Time 12    Period Weeks    Status New    Target Date 06/24/21              Plan - 05/08/21 1249     Clinical Impression Statement Pt demonstrated great progress during today's session as she was able to verbally count 1-10 and also had some single word utterances when talking about beauty products. She also demonstrated exceptional ability using the SGd to indicate  choices. Continued skilled ST intervention is required to instruct pt and her daughter in establishing communication system to increase pt's functional communication.    Speech Therapy Frequency 2x / week    Duration 12 weeks    Treatment/Interventions SLP instruction and feedback;Patient/family education;Functional tasks;Compensatory techniques;Language facilitation;Environmental controls;Multimodal communcation approach;Compensatory strategies;Internal/external aids;Cueing hierarchy    Potential to Achieve  Goals Fair    Potential Considerations Severity of impairments    SLP Home Exercise Plan provided, see pt instructions section    Consulted and Agree with Plan of Care Patient;Family member/caregiver    Family Member Consulted pt's daughter             Patient will benefit from skilled therapeutic intervention in order to improve the following deficits and impairments:   Aphasia  Apraxia  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  Basal ganglia stroke [facility])    Problem List Patient Active Problem List   Diagnosis Date Noted   Aphasia, late effect of cerebrovascular disease 03/05/2021   Apraxia, late effect of cerebrovascular disease 03/05/2021   Sleep disturbance 03/05/2021   Acute on chronic renal failure (H[facility]) 03/05/2021   Slow transit constipation    Iron deficiency anemia    Benign essential HTN    Acute ischemic left middle cerebral artery (MCA) stroke (H[facility]) 02/21/2021   Stroke (cerebrum) (H[facility]) 02/16/2021   Middle cerebral artery embolism, left 02/16/2021   Endotracheally intubated    Hypertensive urgency 09/29/2020   History of ischemic left MCA stroke 09/29/2020   Basal ganglia stroke (HCC) 09/29/2020   Malignant hypertension 09/29/2020   Obesity, Class III, BMI 40-49.9 (morbid obesity) (HCC) 09/29/2020   Hyperlipidemia, mixed 09/29/2020   Janani Chamber B. Dreama Saa M.S., CCC-SLP, [facility] Pathologist Rehabilitation Services Office [phone]  [place], [place] 05/08/2021, 12:52 PM  [place] [facility] MAIN [facility] SERVICES [address] [place], [place], [address] Phone: [phone]   Fax:  [phone]   Name: Charlye Euleta Belson MRN: [id number] Date of Birth: [birth year]

## 2021-05-09 ENCOUNTER — Ambulatory Visit: Payer: Medicaid Other | Admitting: Physical Medicine & Rehabilitation

## 2021-05-12 ENCOUNTER — Other Ambulatory Visit: Payer: Self-pay

## 2021-05-12 ENCOUNTER — Ambulatory Visit: Payer: Medicaid Other | Admitting: Speech Pathology

## 2021-05-12 DIAGNOSIS — I69390 Apraxia following cerebral infarction: Secondary | ICD-10-CM

## 2021-05-12 DIAGNOSIS — R4701 Aphasia: Secondary | ICD-10-CM

## 2021-05-12 DIAGNOSIS — M6281 Muscle weakness (generalized): Secondary | ICD-10-CM | POA: Diagnosis not present

## 2021-05-12 DIAGNOSIS — I639 Cerebral infarction, unspecified: Secondary | ICD-10-CM

## 2021-05-12 DIAGNOSIS — R482 Apraxia: Secondary | ICD-10-CM

## 2021-05-13 NOTE — Patient Instructions (Signed)
Use new icons on SGD Count 1-10

## 2021-05-13 NOTE — Therapy (Signed)
[place] MAIN [facility] SERVICES [address] [place], [place], [address] Phone: [phone]   Fax:  [phone]  Speech Language Pathology Treatment  Patient Details  Name: [name] MRN: [id number] Date of Birth: 1976-03-29 Referring Provider (SLP): Danella Sensing   Encounter Date: 05/12/2021   End of Session - 05/13/21 1838     Visit Number 6    Number of Visits 25    Date for SLP Re-Evaluation 06/24/21    Authorization Type [place] Medicaid Prepaid Health Plan    Authorization Time Period 04/01/2021 thru 06/24/2021    Authorization - Visit Number 6    Authorization - Number of Visits 12    Progress Note Due on Visit 10    SLP Start Time 1500    SLP Stop Time  1620    SLP Time Calculation (min) 80 min    Activity Tolerance Patient tolerated treatment well             Past Medical History:  Diagnosis Date   Anemia    history of goiter    Hypertension    Low back pain    Stroke [facility])     Past Surgical History:  Procedure Laterality Date   CESAREAN SECTION     IR CT HEAD LTD  02/16/2021   IR CT HEAD LTD  02/16/2021   IR CT HEAD LTD  02/16/2021   IR CT HEAD LTD  02/16/2021   IR PERCUTANEOUS ART THROMBECTOMY/INFUSION INTRACRANIAL INC DIAG ANGIO  02/16/2021   RADIOLOGY WITH ANESTHESIA N/A 02/16/2021   Procedure: IR WITH ANESTHESIA;  Surgeon: Luanne Bras, MD;  Location: [place];  Service: Radiology;  Laterality: N/A;   THYROIDECTOMY      There were no vitals filed for this visit.   Subjective Assessment - 05/13/21 1836     Subjective pt arrived with her SGD in hand; pt's daughter reports "she has not put it down, she is constantly looking thru it"    Patient is accompained by: Family member    Currently in Pain? No/denies                   ADULT SLP TREATMENT - 05/13/21 0001       Treatment Provided   Treatment provided Cognitive-Linquistic      Cognitive-Linquistic Treatment   Treatment focused  on Aphasia;Apraxia;Patient/family/caregiver education    Skilled Treatment Skilled treatment session focused on training SGD and using SGD to answer auditory questions as well as promote expressive communication.       Pt assisted SLP in selecting icons for communication of wants/needs. The following patient centered icons were added: FOOD with options added to [employer], [place]; McDonald's - Big Mac, Quarter Pounder, sweet Tea, no pickles; [employer] - 3piece dinner, fries, green beans, sweet tea and sweet potato pie; "I need some privacy;" "I don't want to watch that"   After demonstration pt able to locate and select with slight delay that was increased as she became more familiar icons      Verbal expression: Pt able to independently count 1-10 with coins; frustrated by inability to say her daughter's name d/t severe apraxia of speech              SLP Education - 05/13/21 1838     Education Details SGD, apraxia of speech    Person(s) Educated Patient;Child(ren)    Methods Explanation;Demonstration;Verbal cues    Comprehension Verbalized understanding;Returned demonstration;Need further instruction  SLP Short Term Goals - 04/01/21 1321       SLP SHORT TERM GOAL #1   Title Pt will complete SGD assessment.    Baseline new goal    Time 10    Period --   sessions   Status New      SLP SHORT TERM GOAL #2   Title With minimal assistance, pt will match picture with object in field of 3 with 75% accuracy across 5 sessions.    Baseline 65% with moderate assistance    Time 10    Period --   sessions   Status New      SLP SHORT TERM GOAL #3   Title Pt will imitate set of meaningful vocalizations in 3 out of 5 opportunties during structured speech/language tasks with mod A multimodal cues    Baseline maximal assistance for 1 out of 5 opportunities    Time 10    Period --   sessions   Status New              SLP Long Term Goals - 04/01/21 1327        SLP LONG TERM GOAL #1   Title Using an SGD, pt will indicate 1 need in 3 out of 5 opportunities with communication partners.    Baseline inability to communicate basic wants/needs with multimodal means    Time 12    Period Weeks    Status New    Target Date 06/24/21              Plan - 05/13/21 1840     Clinical Impression Statement Pt continues to be motivated to use SGD. She has made great progress using new icons and is helpful in creating new icons for communicating her wants/needs within the community.    Speech Therapy Frequency 2x / week    Duration 12 weeks    Treatment/Interventions SLP instruction and feedback;Patient/family education;Functional tasks;Compensatory techniques;Language facilitation;Environmental controls;Multimodal communcation approach;Compensatory strategies;Internal/external aids;Cueing hierarchy    Potential to Achieve Goals Fair    Potential Considerations Severity of impairments    SLP Home Exercise Plan provided, see pt instructions section    Consulted and Agree with Plan of Care Patient;Family member/caregiver    Family Member Consulted pt's daughter             Patient will benefit from skilled therapeutic intervention in order to improve the following deficits and impairments:   Aphasia  Apraxia  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 [facility])    Problem List Patient Active Problem List   Diagnosis Date Noted   Aphasia, late effect of cerebrovascular disease 03/05/2021   Apraxia, late effect of cerebrovascular disease 03/05/2021   Sleep disturbance 03/05/2021   Acute on chronic renal failure ([place]) 03/05/2021   Slow transit constipation    Iron deficiency anemia    Benign essential HTN    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 Rutherford Nail M.S., CCC-SLP, [facility] Speech-Language Pathologist Rehabilitation Services Office [place], [place] 05/13/2021, 6:43 PM  [place]  [place] MAIN [facility] SERVICES [place], [place], [address] Phone: [phone]   Fax:  [phone]   Name: [name] MRN: [id number] Date of Birth: 09-18-1975

## 2021-05-14 ENCOUNTER — Ambulatory Visit: Payer: Medicaid Other | Admitting: Speech Pathology

## 2021-05-14 ENCOUNTER — Ambulatory Visit: Payer: Medicaid Other

## 2021-05-16 ENCOUNTER — Encounter: Payer: Medicaid Other | Admitting: Occupational Therapy

## 2021-05-16 ENCOUNTER — Encounter: Payer: Medicaid Other | Admitting: Speech Pathology

## 2021-05-20 ENCOUNTER — Ambulatory Visit: Payer: Medicaid Other | Attending: Registered Nurse | Admitting: Speech Pathology

## 2021-05-20 ENCOUNTER — Other Ambulatory Visit: Payer: Self-pay

## 2021-05-20 ENCOUNTER — Ambulatory Visit: Payer: Medicaid Other

## 2021-05-20 DIAGNOSIS — I69390 Apraxia following cerebral infarction: Secondary | ICD-10-CM

## 2021-05-20 DIAGNOSIS — R4701 Aphasia: Secondary | ICD-10-CM | POA: Diagnosis not present

## 2021-05-20 DIAGNOSIS — I6381 Other cerebral infarction due to occlusion or stenosis of small artery: Secondary | ICD-10-CM

## 2021-05-20 DIAGNOSIS — R482 Apraxia: Secondary | ICD-10-CM | POA: Diagnosis present

## 2021-05-20 DIAGNOSIS — I639 Cerebral infarction, unspecified: Secondary | ICD-10-CM | POA: Diagnosis present

## 2021-05-21 NOTE — Therapy (Signed)
[facility] MAIN [facility] SERVICES [address] [place], [place], [address] Phone: [phone]   Fax:  [phone]  Speech Language Pathology Treatment  Patient Details  Name: [name] MRN: [id number] Date of Birth: Jan 14, 1976 Referring Provider (SLP): Jacalyn Lefevre   Encounter Date: 05/20/2021   End of Session - 05/21/21 1552     Visit Number 7    Number of Visits 25    Date for SLP Re-Evaluation 06/24/21    Authorization Type [place] Medicaid Prepaid Health Plan    Authorization Time Period 04/01/2021 thru 06/24/2021    Authorization - Visit Number 7    Authorization - Number of Visits 12    Progress Note Due on Visit 10    SLP Start Time 1005    SLP Stop Time  1100    SLP Time Calculation (min) 55 min    Activity Tolerance Patient tolerated treatment well             Past Medical History:  Diagnosis Date   Anemia    history of goiter    Hypertension    Low back pain    Stroke [facility]     Past Surgical History:  Procedure Laterality Date   CESAREAN SECTION     IR CT HEAD LTD  02/16/2021   IR CT HEAD LTD  02/16/2021   IR CT HEAD LTD  02/16/2021   IR CT HEAD LTD  02/16/2021   IR PERCUTANEOUS ART THROMBECTOMY/INFUSION INTRACRANIAL INC DIAG ANGIO  02/16/2021   RADIOLOGY WITH ANESTHESIA N/A 02/16/2021   Procedure: IR WITH ANESTHESIA;  Surgeon: Julieanne Cotton, MD;  Location: [facility] OR;  Service: Radiology;  Laterality: N/A;   THYROIDECTOMY      There were no vitals filed for this visit.   Subjective Assessment - 05/21/21 1543     Subjective dauther reports that pt is using device to request privacy so that she can interact with device    Patient is accompained by: Family member    Currently in Pain? No/denies                   ADULT SLP TREATMENT - 05/21/21 0001       Treatment Provided   Treatment provided Cognitive-Linquistic      Cognitive-Linquistic Treatment   Treatment focused on  Aphasia;Apraxia;Patient/family/caregiver education    Skilled Treatment Skilled treatment session targeted using SGD to communicate functional information. To help with pt's visual field deficits, background color was changed to yellow with greater accuracy noted in icon selection by pt. SLP further instructed pt and her daughter in how to add icons using Higher education careers adviser as well as web; added additional restaurants; using mirror pt required extensive cues to imitate oral motor movement - pucker, smile, lingual protrusion, after multiple repetitions, pt able to add voicing to produce vowels /a/ and /e/              SLP Education - 05/21/21 1552     Education Details apraxia of speech    Person(s) Educated Patient;Child(ren)    Methods Explanation;Demonstration;Verbal cues;Handout    Comprehension Verbalized understanding;Need further instruction              SLP Short Term Goals - 04/01/21 1321       SLP SHORT TERM GOAL #1   Title Pt will complete SGD assessment.    Baseline new goal    Time 10    Period --   sessions  Status New      SLP SHORT TERM GOAL #2   Title With minimal assistance, pt will match picture with object in field of 3 with 75% accuracy across 5 sessions.    Baseline 65% with moderate assistance    Time 10    Period --   sessions   Status New      SLP SHORT TERM GOAL #3   Title Pt will imitate set of meaningful vocalizations in 3 out of 5 opportunties during structured speech/language tasks with mod A multimodal cues    Baseline maximal assistance for 1 out of 5 opportunities    Time 10    Period --   sessions   Status New              SLP Long Term Goals - 04/01/21 1327       SLP LONG TERM GOAL #1   Title Using an SGD, pt will indicate 1 need in 3 out of 5 opportunities with communication partners.    Baseline inability to communicate basic wants/needs with multimodal means    Time 12    Period Weeks    Status New    Target Date 06/24/21               Plan - 05/21/21 1553     Clinical Impression Statement Pt continues to be motivated to use SGD. She has made great progress using new icons and is helpful in creating new icons for communicating her wants/needs within the community.    Speech Therapy Frequency 2x / week    Duration 12 weeks    Treatment/Interventions SLP instruction and feedback;Patient/family education;Functional tasks;Compensatory techniques;Language facilitation;Environmental controls;Multimodal communcation approach;Compensatory strategies;Internal/external aids;Cueing hierarchy    Potential to Achieve Goals Fair    Potential Considerations Severity of impairments    SLP Home Exercise Plan provided, see pt instructions section    Consulted and Agree with Plan of Care Patient;Family member/caregiver    Family Member Consulted pt's daughter             Patient will benefit from skilled therapeutic intervention in order to improve the following deficits and impairments:   Aphasia  Apraxia  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 [facility])  Basal ganglia stroke [facility])    Problem List Patient Active Problem List   Diagnosis Date Noted   Aphasia, late effect of cerebrovascular disease 03/05/2021   Apraxia, late effect of cerebrovascular disease 03/05/2021   Sleep disturbance 03/05/2021   Acute on chronic renal failure ([place]) 03/05/2021   Slow transit constipation    Iron deficiency anemia    Benign essential HTN    Acute ischemic left middle cerebral artery (MCA) stroke ([place]) 02/21/2021   Stroke (cerebrum) ([place]) 02/16/2021   Middle cerebral artery embolism, left 02/16/2021   Endotracheally intubated    Hypertensive urgency 09/29/2020   History of ischemic left MCA stroke 09/29/2020   Basal ganglia stroke ([place]) 09/29/2020   Malignant hypertension 09/29/2020   Obesity, Class III, BMI 40-49.9 (morbid obesity) ([place]) 09/29/2020   Hyperlipidemia, mixed 09/29/2020    Johnnay Pleitez B. Rutherford Nail M.S., CCC-SLP, [facility] Speech-Language Pathologist Rehabilitation Services Office [phone]  Stormy Fabian, [place] 05/21/2021, 3:54 PM  [place] MAIN [facility] SERVICES [address] [place], [place], [address] Phone: [phone]   Fax:  [phone]   Name: [name] MRN: [id number] Date of Birth: 25-Dec-1975

## 2021-05-21 NOTE — Patient Instructions (Signed)
Add new icons to SGD

## 2021-05-22 ENCOUNTER — Other Ambulatory Visit: Payer: Self-pay

## 2021-05-22 ENCOUNTER — Ambulatory Visit: Payer: Medicaid Other | Admitting: Speech Pathology

## 2021-05-22 DIAGNOSIS — R4701 Aphasia: Secondary | ICD-10-CM | POA: Diagnosis not present

## 2021-05-22 DIAGNOSIS — I69390 Apraxia following cerebral infarction: Secondary | ICD-10-CM

## 2021-05-22 DIAGNOSIS — R482 Apraxia: Secondary | ICD-10-CM

## 2021-05-22 DIAGNOSIS — I639 Cerebral infarction, unspecified: Secondary | ICD-10-CM

## 2021-05-23 NOTE — Therapy (Signed)
[facility] MAIN [facility] SERVICES [address] [place], [place], [address] Phone: [phone]   Fax:  [phone]  Speech Language Pathology Treatment  Patient Details  Name: [name] MRN: [id number] Date of Birth: 1975/08/14 Referring Provider (SLP): Jacalyn Lefevre   Encounter Date: 05/22/2021   End of Session - 05/23/21 1701     Visit Number 8    Number of Visits 25    Date for SLP Re-Evaluation 06/24/21    Authorization Type [place] Medicaid Prepaid Health Plan    Authorization Time Period 04/01/2021 thru 06/24/2021    Authorization - Visit Number 8    Authorization - Number of Visits 12    Progress Note Due on Visit 10    SLP Start Time 1300    SLP Stop Time  1400    SLP Time Calculation (min) 60 min    Activity Tolerance Patient tolerated treatment well             Past Medical History:  Diagnosis Date   Anemia    history of goiter    Hypertension    Low back pain    Stroke [facility])     Past Surgical History:  Procedure Laterality Date   CESAREAN SECTION     IR CT HEAD LTD  02/16/2021   IR CT HEAD LTD  02/16/2021   IR CT HEAD LTD  02/16/2021   IR CT HEAD LTD  02/16/2021   IR PERCUTANEOUS ART THROMBECTOMY/INFUSION INTRACRANIAL INC DIAG ANGIO  02/16/2021   RADIOLOGY WITH ANESTHESIA N/A 02/16/2021   Procedure: IR WITH ANESTHESIA;  Surgeon: Julieanne Cotton, MD;  [facility] OR;  Service: Radiology;  Laterality: N/A;   THYROIDECTOMY      There were no vitals filed for this visit.   Subjective Assessment - 05/23/21 1659     Subjective pt arrived ot session with device in hand, accompanied by her daughter    Patient is accompained by: Family member    Currently in Pain? No/denies                   ADULT SLP TREATMENT - 05/23/21 0001       Treatment Provided   Treatment provided Cognitive-Linquistic      Cognitive-Linquistic Treatment   Treatment focused on Aphasia;Apraxia;Patient/family/caregiver  education    Skilled Treatment Skilled treatment session targeted using SGD to communicate functional information. To help with pt's visual field deficits, background color was changed to yellow with greater accuracy noted in icon selection by pt. SLP further instructed pt and her daughter in how to add icons using Higher education careers adviser as well as web; added additional restaurants; using mirror pt required extensive cues to imitate oral motor movement - pucker, smile, lingual protrusion, after multiple repetitions, pt able to add voicing to produce vowels /a/ and /e/              SLP Education - 05/23/21 1700     Education Details ways to promote social interaction with [name]    Person(s) Educated Patient;Child(ren)    Methods Explanation;Demonstration;Verbal cues    Comprehension Verbalized understanding;Need further instruction              SLP Short Term Goals - 04/01/21 1321       SLP SHORT TERM GOAL #1   Title Pt will complete SGD assessment.    Baseline new goal    Time 10    Period --   sessions  Status New      SLP SHORT TERM GOAL #2   Title With minimal assistance, pt will match picture with object in field of 3 with 75% accuracy across 5 sessions.    Baseline 65% with moderate assistance    Time 10    Period --   sessions   Status New      SLP SHORT TERM GOAL #3   Title Pt will imitate set of meaningful vocalizations in 3 out of 5 opportunties during structured speech/language tasks with mod A multimodal cues    Baseline maximal assistance for 1 out of 5 opportunities    Time 10    Period --   sessions   Status New              SLP Long Term Goals - 04/01/21 1327       SLP LONG TERM GOAL #1   Title Using an SGD, pt will indicate 1 need in 3 out of 5 opportunities with communication partners.    Baseline inability to communicate basic wants/needs with multimodal means    Time 12    Period Weeks    Status New    Target Date 06/24/21               Plan - 05/23/21 1701     Clinical Impression Statement Pt continues to be motivated to use SGD. She has made great progress using new icons and is helpful in creating new icons for communicating her wants/needs within the community.    Speech Therapy Frequency 2x / week    Duration 12 weeks    Treatment/Interventions SLP instruction and feedback;Patient/family education;Functional tasks;Compensatory techniques;Language facilitation;Environmental controls;Multimodal communcation approach;Compensatory strategies;Internal/external aids;Cueing hierarchy    Potential to Achieve Goals Fair    Potential Considerations Severity of impairments    Consulted and Agree with Plan of Care Patient;Family member/caregiver    Family Member Consulted pt's daughter             Patient will benefit from skilled therapeutic intervention in order to improve the following deficits and impairments:   Aphasia  Apraxia  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 [facility])    Problem List Patient Active Problem List   Diagnosis Date Noted   Aphasia, late effect of cerebrovascular disease 03/05/2021   Apraxia, late effect of cerebrovascular disease 03/05/2021   Sleep disturbance 03/05/2021   Acute on chronic renal failure ([place]) 03/05/2021   Slow transit constipation    Iron deficiency anemia    Benign essential HTN    Acute ischemic left middle cerebral artery (MCA) stroke ([place]) 02/21/2021   Stroke (cerebrum) ([place]) 02/16/2021   Middle cerebral artery embolism, left 02/16/2021   Endotracheally intubated    Hypertensive urgency 09/29/2020   History of ischemic left MCA stroke 09/29/2020   Basal ganglia stroke ([place]) 09/29/2020   Malignant hypertension 09/29/2020   Obesity, Class III, BMI 40-49.9 (morbid obesity) ([place]) 09/29/2020   Hyperlipidemia, mixed 09/29/2020    [name], [place] 05/23/2021, 5:02 PM  [name], [place], [address] Phone: [phone]   Fax:  [phone]   Name: [name] MRN: [id number] Date of Birth: [birth year]

## 2021-05-27 ENCOUNTER — Ambulatory Visit: Payer: Medicaid Other

## 2021-05-27 ENCOUNTER — Ambulatory Visit: Payer: Medicaid Other | Admitting: Speech Pathology

## 2021-05-29 ENCOUNTER — Other Ambulatory Visit: Payer: Self-pay

## 2021-05-29 ENCOUNTER — Ambulatory Visit: Payer: Medicaid Other | Admitting: Speech Pathology

## 2021-05-29 DIAGNOSIS — R482 Apraxia: Secondary | ICD-10-CM

## 2021-05-29 DIAGNOSIS — I69390 Apraxia following cerebral infarction: Secondary | ICD-10-CM

## 2021-05-29 DIAGNOSIS — I639 Cerebral infarction, unspecified: Secondary | ICD-10-CM

## 2021-05-29 DIAGNOSIS — R4701 Aphasia: Secondary | ICD-10-CM | POA: Diagnosis not present

## 2021-05-30 ENCOUNTER — Other Ambulatory Visit: Payer: Self-pay

## 2021-05-30 NOTE — Patient Outreach (Signed)
[facility]) Care Management  05/30/2021  [name] [birth year] [id number]   Telephone outreach to patient to obtain mRS was successfully completed. MRS=3  [place] Care Management Assistant [phone]

## 2021-05-31 NOTE — Therapy (Signed)
[facility] MAIN [facility] SERVICES [address] [place], [place], [address] Phone: [phone]   Fax:  [phone]  Speech Language Pathology Treatment  Patient Details  Name: [name] MRN: [id number] Date of Birth: 29-Jun-1975 Referring Provider (SLP): Jacalyn Lefevre   Encounter Date: 05/29/2021   End of Session - 05/31/21 1527     Visit Number 9    Number of Visits 25    Date for SLP Re-Evaluation 06/24/21    Authorization Type [place] Medicaid Prepaid Health Plan    Authorization Time Period 04/01/2021 thru 06/24/2021    Authorization - Visit Number 9    Authorization - Number of Visits 12    Progress Note Due on Visit 10    SLP Start Time 1100    SLP Stop Time  1200    SLP Time Calculation (min) 60 min    Activity Tolerance Patient tolerated treatment well             Past Medical History:  Diagnosis Date   Anemia    history of goiter    Hypertension    Low back pain    Stroke [facility])     Past Surgical History:  Procedure Laterality Date   CESAREAN SECTION     IR CT HEAD LTD  02/16/2021   IR CT HEAD LTD  02/16/2021   IR CT HEAD LTD  02/16/2021   IR CT HEAD LTD  02/16/2021   IR PERCUTANEOUS ART THROMBECTOMY/INFUSION INTRACRANIAL INC DIAG ANGIO  02/16/2021   RADIOLOGY WITH ANESTHESIA N/A 02/16/2021   Procedure: IR WITH ANESTHESIA;  Surgeon: Julieanne Cotton, MD;  [facility] OR;  Service: Radiology;  Laterality: N/A;   THYROIDECTOMY      There were no vitals filed for this visit.   Subjective Assessment - 05/31/21 1526     Subjective pt arrived to session with device in hand, accompanied by her daughter    Patient is accompained by: Family member    Currently in Pain? No/denies                   ADULT SLP TREATMENT - 05/31/21 0001       Treatment Provided   Treatment provided Cognitive-Linquistic      Cognitive-Linquistic Treatment   Treatment focused on Aphasia;Apraxia;Patient/family/caregiver  education    Skilled Treatment Skilled treatment session focused on gathering more topics of interest to promote functional communication across all areas of personal interest. SLP facilitated by engaging pt and her daughter in completing [employer]'s "Interest Checklist." As a result of pt's responses the following were added:  FINANCES - How much does that cost?, Hand me my wallet, How much do I have in my checking?, I need cash, How much will that cost?: SHOPPING - Let's go to [employer], [employer], Sh[employer]; ALLERGIC - [employer] -   Accuracy when using device, pt was 100% accurate when selecting icons given verbal only - great improvement              SLP Education - 05/31/21 1527     Education Details provided    [employer]) Educated Patient;Child(ren)    Methods Explanation;Demonstration;Verbal cues    Comprehension Verbalized understanding;Need further instruction;Returned demonstration              SLP Short Term Goals - 04/01/21 1321       SLP SHORT TERM GOAL #1   Title Pt will complete SGD assessment.    Baseline new goal  Time 10    Period --   sessions   Status New      SLP SHORT TERM GOAL #2   Title With minimal assistance, pt will match picture with object in field of 3 with 75% accuracy across 5 sessions.    Baseline 65% with moderate assistance    Time 10    Period --   sessions   Status New      SLP SHORT TERM GOAL #3   Title Pt will imitate set of meaningful vocalizations in 3 out of 5 opportunties during structured speech/language tasks with mod A multimodal cues    Baseline maximal assistance for 1 out of 5 opportunities    Time 10    Period --   sessions   Status New              SLP Long Term Goals - 04/01/21 1327       SLP LONG TERM GOAL #1   Title Using an SGD, pt will indicate 1 need in 3 out of 5 opportunities with communication partners.    Baseline inability to communicate basic wants/needs with multimodal means    Time 12     Period Weeks    Status New    Target Date 06/24/21              Plan - 05/31/21 1527     Clinical Impression Statement Pt continues to be motivated to use SGD. She has made great progress using new icons and is helpful in creating new icons for communicating her wants/needs within the community. Despite continued multimodal assistance, pt remains unable to produce volitional speech sounds.    Duration 12 weeks    Treatment/Interventions SLP instruction and feedback;Patient/family education;Functional tasks;Compensatory techniques;Language facilitation;Environmental controls;Multimodal communcation approach;Compensatory strategies;Internal/external aids;Cueing hierarchy    Potential to Achieve Goals Fair    Potential Considerations Severity of impairments    SLP Home Exercise Plan provided, see pt instructions section    Consulted and Agree with Plan of Care Patient;Family member/caregiver    Family Member Consulted pt's daughter             Patient will benefit from skilled therapeutic intervention in order to improve the following deficits and impairments:   Aphasia  Apraxia  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 [facility])    Problem List Patient Active Problem List   Diagnosis Date Noted   Aphasia, late effect of cerebrovascular disease 03/05/2021   Apraxia, late effect of cerebrovascular disease 03/05/2021   Sleep disturbance 03/05/2021   Acute on chronic renal failure ([facility]) 03/05/2021   Slow transit constipation    Iron deficiency anemia    Benign essential HTN    Acute ischemic left middle cerebral artery (MCA) stroke ([facility]) 02/21/2021   Stroke (cerebrum) ([facility]) 02/16/2021   Middle cerebral artery embolism, left 02/16/2021   Endotracheally intubated    Hypertensive urgency 09/29/2020   History of ischemic left MCA stroke 09/29/2020   Basal ganglia stroke ([facility]) 09/29/2020   Malignant hypertension 09/29/2020   Obesity, Class III,  BMI 40-49.9 (morbid obesity) (HCC) 09/29/2020   Hyperlipidemia, mixed 09/29/2020   Analyssa Downs B. Dreama Saa M.S., CCC-SLP, [facility] Pathologist Rehabilitation Services Office [phone], [place] 05/31/2021, 3:28 PM  [facility] MAIN [facility] SERVICES [address] [place], [place], [address] Phone: [phone]   Fax:  [phone]   Name: [name] MRN: [id number] Date of Birth: [birth year]

## 2021-05-31 NOTE — Patient Instructions (Signed)
Take pictures of icons that pt seems to like

## 2021-06-03 ENCOUNTER — Ambulatory Visit: Payer: Medicaid Other | Admitting: Physical Medicine & Rehabilitation

## 2021-06-03 ENCOUNTER — Other Ambulatory Visit: Payer: Self-pay

## 2021-06-03 ENCOUNTER — Ambulatory Visit: Payer: Medicaid Other | Admitting: Speech Pathology

## 2021-06-03 DIAGNOSIS — I639 Cerebral infarction, unspecified: Secondary | ICD-10-CM

## 2021-06-03 DIAGNOSIS — R482 Apraxia: Secondary | ICD-10-CM

## 2021-06-03 DIAGNOSIS — R4701 Aphasia: Secondary | ICD-10-CM

## 2021-06-03 DIAGNOSIS — I69390 Apraxia following cerebral infarction: Secondary | ICD-10-CM

## 2021-06-05 ENCOUNTER — Other Ambulatory Visit: Payer: Self-pay

## 2021-06-05 ENCOUNTER — Ambulatory Visit: Payer: Medicaid Other | Admitting: Speech Pathology

## 2021-06-05 DIAGNOSIS — R4701 Aphasia: Secondary | ICD-10-CM | POA: Diagnosis not present

## 2021-06-05 DIAGNOSIS — R482 Apraxia: Secondary | ICD-10-CM

## 2021-06-05 DIAGNOSIS — I69390 Apraxia following cerebral infarction: Secondary | ICD-10-CM

## 2021-06-05 DIAGNOSIS — I639 Cerebral infarction, unspecified: Secondary | ICD-10-CM

## 2021-06-05 NOTE — Therapy (Signed)
[place] MAIN [facility] SERVICES [address] [place], [place], [address] Phone: [phone]   Fax:  [phone]  Speech Language Pathology Treatment  Patient Details  Name: [name] MRN: [id number] Date of Birth: 1976/02/06 Referring Provider (SLP): Danella Sensing   Encounter Date: 06/05/2021   End of Session - 06/05/21 2246     Visit Number 11    Number of Visits 25    Date for SLP Re-Evaluation 06/24/21    Authorization Type [place] Medicaid Prepaid Health Plan    Authorization Time Period 04/01/2021 thru 06/24/2021    Authorization - Visit Number 1    Authorization - Number of Visits 12    Progress Note Due on Visit 10    SLP Start Time 1100    SLP Stop Time  1200    SLP Time Calculation (min) 60 min    Activity Tolerance Patient tolerated treatment well             Past Medical History:  Diagnosis Date   Anemia    history of goiter    Hypertension    Low back pain    Stroke [facility])     Past Surgical History:  Procedure Laterality Date   CESAREAN SECTION     IR CT HEAD LTD  02/16/2021   IR CT HEAD LTD  02/16/2021   IR CT HEAD LTD  02/16/2021   IR CT HEAD LTD  02/16/2021   IR PERCUTANEOUS ART THROMBECTOMY/INFUSION INTRACRANIAL INC DIAG ANGIO  02/16/2021   RADIOLOGY WITH ANESTHESIA N/A 02/16/2021   Procedure: IR WITH ANESTHESIA;  Surgeon: Luanne Bras, MD;  Location: [place];  Service: Radiology;  Laterality: N/A;   THYROIDECTOMY      There were no vitals filed for this visit.   Subjective Assessment - 06/05/21 2241     Subjective pt continues to be pleasant, eager to engage in all activities    Patient is accompained by: Family member    Currently in Pain? No/denies                   ADULT SLP TREATMENT - 06/05/21 2241       Treatment Provided   Treatment provided Cognitive-Linquistic      Cognitive-Linquistic Treatment   Treatment focused on Aphasia;Apraxia;Patient/family/caregiver education     Skilled Treatment Skilled treatment session focused on customizing pt's SGD to improve pt initiation as well as her ability to check in at appointments. SLP faciliated session by consulting with pt and her daughter on ways pt greeted people prior to stroke: Hi, how are you, how was your weekend, with pt's daughter instructed to add icon/utterances about activities that pt had engaged in during the weekend for conversational sharing. Additional pictures added of rehab waiting room, receptionist with greetings and patient specific info added for pt checkin.              SLP Education - 06/05/21 2246     Education Details ST POC to target pt initiation of conversations with others    Person(s) Educated Patient;Child(ren)    Methods Explanation;Demonstration;Verbal cues    Comprehension Verbalized understanding;Returned demonstration;Need further instruction              SLP Short Term Goals - 06/05/21 0710       SLP SHORT TERM GOAL #1   Title Using SGD, pt will initiate one communication exchange with therapist in 8 out of 10 opportunities.    Baseline goal met  during reporting period, goal revised to reflect progress    Time 10    Period --   sessions   Status Revised      SLP SHORT TERM GOAL #2   Title Using SGD, pt will ask a question regarding ADLs in 8 out of 10 opportunities.    Baseline goal met during reporting period, revised to reflect progress    Time 10    Period --   sessions   Status Revised      SLP SHORT TERM GOAL #3   Title Pt and her daughter will create at least 3 functional utterances over the course of 5 sessions.    Baseline goal met during reporting period, goal revised to reflect progress    Time 10    Period --   sessions   Status Revised              SLP Long Term Goals - 06/05/21 6333       SLP LONG TERM GOAL #1   Title Using an SGD, pt will indicate 1 need in 3 out of 5 opportunities with communication partners.    Status On-going     Target Date 06/24/21      SLP LONG TERM GOAL #2   Title Using an SGD, pt will check-in for each therapy session.    Baseline new goal d/t great progress with ability using SGD    Time 4    Period Weeks    Status New    Target Date 06/24/21              Plan - 06/05/21 2247     Clinical Impression Statement Pt excited to conversational utterances to promote ways to actively interact with SLP and receptionist. Skilled ST intervention continues to be required to customize, instruct and train pt/her family in using SGD for functional communication.    Speech Therapy Frequency 2x / week    Duration 12 weeks    Treatment/Interventions SLP instruction and feedback;Patient/family education;Functional tasks;Compensatory techniques;Language facilitation;Environmental controls;Multimodal communcation approach;Compensatory strategies;Internal/external aids;Cueing hierarchy    Potential to Achieve Goals Good    Potential Considerations Severity of impairments    SLP Home Exercise Plan provided, see pt instructions section    Consulted and Agree with Plan of Care Patient;Family member/caregiver    Family Member Consulted pt's daughter             Patient will benefit from skilled therapeutic intervention in order to improve the following deficits and impairments:   Aphasia  Apraxia  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 [facility])    Problem List Patient Active Problem List   Diagnosis Date Noted   Aphasia, late effect of cerebrovascular disease 03/05/2021   Apraxia, late effect of cerebrovascular disease 03/05/2021   Sleep disturbance 03/05/2021   Acute on chronic renal failure ([place]) 03/05/2021   Slow transit constipation    Iron deficiency anemia    Benign essential HTN    Acute ischemic left middle cerebral artery (MCA) stroke ([place]) 02/21/2021   Stroke (cerebrum) ([place]) 02/16/2021   Middle cerebral artery embolism, left 02/16/2021    Endotracheally intubated    Hypertensive urgency 09/29/2020   History of ischemic left MCA stroke 09/29/2020   Basal ganglia stroke ([place]) 09/29/2020   Malignant hypertension 09/29/2020   Obesity, Class III, BMI 40-49.9 (morbid obesity) ([place]) 09/29/2020   Hyperlipidemia, mixed 09/29/2020   Shannie Kontos B. Rutherford Nail, M.S., CCC-SLP, CBIS Speech-Language Pathologist Rehabilitation  Services Office [place], Layne Benton 06/05/2021, 10:49 PM  [place] MAIN [facility] SERVICES [address] [place], [place], [address] Phone: [phone]   Fax:  [phone]   Name: Mihaela Azalee Weimer MRN: [id number] Date of Birth: 1975-07-13

## 2021-06-05 NOTE — Therapy (Signed)
[place] MAIN [facility] SERVICES [address] [place], [place], [address] Phone: [phone]   Fax:  [phone]  Speech Language Pathology Treatment  Speech Therapy Progress Note   Dates of reporting period  04/01/2021   to   06/03/2021   Patient Details  Name: [name] MRN: [id number] Date of Birth: October 20, 1975 Referring Provider (SLP): Danella Sensing   Encounter Date: 06/03/2021  Speech Therapy Progress Note   Dates of Reporting Period: 04/01/2021 to 06/03/2021  Objective: Patient has been seen for 10 speech therapy sessions this reporting period targeting aphasia, apraxia of speech and training in speech generating device (SGD)  Patient EXCEEDED EXPECTATIONS and as a result is making significant gains in her ability to communicate using  an SGD. As such, process has been initiated for pt to obtain her own permanent device. Specifically, pt has met all previous STGs and is making great strides towards mastery of her LTGs. See skilled intervention, clinical impressions, and goals below for details.    End of Session - 06/05/21 0708     Visit Number 10    Number of Visits 25    Date for SLP Re-Evaluation 06/24/21    Authorization Type  Medicaid Prepaid Health Plan    Authorization Time Period 04/01/2021 thru 06/24/2021    Authorization - Visit Number 10    Authorization - Number of Visits 12    Progress Note Due on Visit 10    SLP Start Time 1400    SLP Stop Time  1500    SLP Time Calculation (min) 60 min    Activity Tolerance Patient tolerated treatment well             Past Medical History:  Diagnosis Date   Anemia    history of goiter    Hypertension    Low back pain    Stroke [facility])     Past Surgical History:  Procedure Laterality Date   CESAREAN SECTION     IR CT HEAD LTD  02/16/2021   IR CT HEAD LTD  02/16/2021   IR CT HEAD LTD  02/16/2021   IR CT HEAD LTD  02/16/2021   IR PERCUTANEOUS ART  THROMBECTOMY/INFUSION INTRACRANIAL INC DIAG ANGIO  02/16/2021   RADIOLOGY WITH ANESTHESIA N/A 02/16/2021   Procedure: IR WITH ANESTHESIA;  Surgeon: Luanne Bras, MD;  Location: [place];  Service: Radiology;  Laterality: N/A;   THYROIDECTOMY      There were no vitals filed for this visit.   Subjective Assessment - 06/05/21 0706     Subjective she loves the songs that you put on there (referring to songs that SLP enjoys listening to and thought pt might as well)    Patient is accompained by: Family member    Currently in Pain? No/denies                   ADULT SLP TREATMENT - 06/05/21 0001       Treatment Provided   Treatment provided Cognitive-Linquistic      Cognitive-Linquistic Treatment   Treatment focused on Aphasia;Apraxia;Patient/family/caregiver education    Skilled Treatment Skilled treatment session focused on gathering more topics of interest to promote functional communication across all areas of personal interest. SLP facilitated by engaging pt and her daughter in completing [employer]'s "Interest Checklist." As a result of pt's responses the following were added: YOUTUBE: internet link created to favorite music including, [place], Men of  Prayze, [employer], [employer] & [employer], [employer], Lanny Hurst "[employer]" Johns, Tim Rogers & the [employer], [employer], when playing several songs, pt with humming following the prosody of song - great improvement in activating vocal cord movement.   Also called [employer] and assisted with loading Chrome onto pt's device to instruct in adding URL links to device.              SLP Education - 06/05/21 0708     Education Details how to link URL's to device for improved patient specialization    Person(s) Educated Patient;Child(ren)    Methods Explanation;Demonstration;Verbal cues    Comprehension Verbalized understanding;Returned demonstration;Need further  instruction              SLP Short Term Goals - 06/05/21 0710       SLP SHORT TERM GOAL #1   Title Using SGD, pt will initiate one communication exchange with therapist in 8 out of 10 opportunities.    Baseline goal met during reporting period, goal revised to reflect progress    Time 10    Period --   sessions   Status Revised      SLP SHORT TERM GOAL #2   Title Using SGD, pt will ask a question regarding ADLs in 8 out of 10 opportunities.    Baseline goal met during reporting period, revised to reflect progress    Time 10    Period --   sessions   Status Revised      SLP SHORT TERM GOAL #3   Title Pt and her daughter will create at least 3 functional utterances over the course of 5 sessions.    Baseline goal met during reporting period, goal revised to reflect progress    Time 10    Period --   sessions   Status Revised              SLP Long Term Goals - 06/05/21 2979       SLP LONG TERM GOAL #1   Title Using an SGD, pt will indicate 1 need in 3 out of 5 opportunities with communication partners.    Status On-going    Target Date 06/24/21      SLP LONG TERM GOAL #2   Title Using an SGD, pt will check-in for each therapy session.    Baseline new goal d/t great progress with ability using SGD    Time 4    Period Weeks    Status New    Target Date 06/24/21              Plan - 06/05/21 0709     Clinical Impression Statement Pt continues to be motivated to use SGD. She has made great progress using new icons and is helpful in creating new icons for communicating her wants/needs within the community. Despite continued multimodal assistance, pt remains unable to produce volitional speech sounds.    Speech Therapy Frequency 2x / week    Duration 12 weeks    Treatment/Interventions SLP instruction and feedback;Patient/family education;Functional tasks;Compensatory techniques;Language facilitation;Environmental controls;Multimodal communcation  approach;Compensatory strategies;Internal/external aids;Cueing hierarchy    Potential to Achieve Goals Good    Potential Considerations Severity of impairments    Consulted and Agree with Plan of Care Patient;Family member/caregiver    Family Member Consulted pt's daughter             Patient will benefit from skilled therapeutic intervention in order to improve the following deficits and  impairments:   Aphasia  Apraxia  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 [facility])    Problem List Patient Active Problem List   Diagnosis Date Noted   Aphasia, late effect of cerebrovascular disease 03/05/2021   Apraxia, late effect of cerebrovascular disease 03/05/2021   Sleep disturbance 03/05/2021   Acute on chronic renal failure ([place]) 03/05/2021   Slow transit constipation    Iron deficiency anemia    Benign essential HTN    Acute ischemic left middle cerebral artery (MCA) stroke ([place]) 02/21/2021   Stroke (cerebrum) ([place]) 02/16/2021   Middle cerebral artery embolism, left 02/16/2021   Endotracheally intubated    Hypertensive urgency 09/29/2020   History of ischemic left MCA stroke 09/29/2020   Basal ganglia stroke ([place]) 09/29/2020   Malignant hypertension 09/29/2020   Obesity, Class III, BMI 40-49.9 (morbid obesity) ([place]) 09/29/2020   Hyperlipidemia, mixed 09/29/2020   Shavette Shoaff B. Rutherford Nail M.S., CCC-SLP, [facility] Pathologist Rehabilitation Services Office [phone], [place] 06/05/2021, 7:20 AM  [place] MAIN [facility] SERVICES [address] [place], [place], [address] Phone: [phone]   Fax:  [phone]   Name: [name] MRN: [id number] Date of Birth: October 29, 1975

## 2021-06-05 NOTE — Patient Instructions (Signed)
Continue using SGD Pt's daughter to add utterances for conversation about weekend events

## 2021-06-10 ENCOUNTER — Ambulatory Visit: Payer: Medicaid Other | Admitting: Speech Pathology

## 2021-06-10 ENCOUNTER — Other Ambulatory Visit: Payer: Self-pay

## 2021-06-10 DIAGNOSIS — R4701 Aphasia: Secondary | ICD-10-CM

## 2021-06-10 DIAGNOSIS — R482 Apraxia: Secondary | ICD-10-CM

## 2021-06-10 DIAGNOSIS — I69390 Apraxia following cerebral infarction: Secondary | ICD-10-CM

## 2021-06-10 NOTE — Therapy (Addendum)
[place] MAIN [facility] SERVICES [address] [place], [place], [address] Phone: [phone]   Fax:  [phone]  Speech Language Pathology Treatment  Patient Details  Name: [name] MRN: [id number] Date of Birth: 1975/12/13 Referring Provider (SLP): Danella Sensing   Encounter Date: 06/10/2021   End of Session - 06/10/21 1728     Visit Number 12    Number of Visits 25    Date for SLP Re-Evaluation 06/24/21    Authorization Type [place] Medicaid [place] Time Period 04/01/2021 thru 06/24/2021    Authorization - Visit Number 2    Authorization - Number of Visits 12    Progress Note Due on Visit 10    SLP Start Time 1422    SLP Stop Time  1500    SLP Time Calculation (min) 38 min    Activity Tolerance Patient tolerated treatment well             Past Medical History:  Diagnosis Date   Anemia    history of goiter    Hypertension    Low back pain    Stroke [facility])     Past Surgical History:  Procedure Laterality Date   CESAREAN SECTION     IR CT HEAD LTD  02/16/2021   IR CT HEAD LTD  02/16/2021   IR CT HEAD LTD  02/16/2021   IR CT HEAD LTD  02/16/2021   IR PERCUTANEOUS ART THROMBECTOMY/INFUSION INTRACRANIAL INC DIAG ANGIO  02/16/2021   RADIOLOGY WITH ANESTHESIA N/A 02/16/2021   Procedure: IR WITH ANESTHESIA;  Surgeon: Luanne Bras, MD;  Location: [place];  Service: Radiology;  Laterality: N/A;   THYROIDECTOMY      There were no vitals filed for this visit.   Subjective Assessment - 06/10/21 1725     Subjective pt continues to be pleasant, eager to engage in all activities    Patient is accompained by: Family member    Currently in Pain? No/denies                   ADULT SLP TREATMENT - 06/11/21 0001       Treatment Provided   Treatment provided Cognitive-Linquistic      Cognitive-Linquistic Treatment   Treatment focused on Aphasia;Apraxia;Patient/family/caregiver education     Skilled Treatment Skilled treatment session focused on customizing pt's SGD to improve pt initiation as well as her ability to check in at appointments. Pt was not able to use conversational exchanges using SGD with SLP as she didn't understand the interactive nature of utterances and perhaps didn't understanding the icons/utterances. SLP facilitated by using an index card with picture/utterance (How are you?) to demonstrate the physical exchange but this wasn't effective.    [place] [employer] Scales of Cognitive Functioning No response              SLP Education - 06/10/21 1726     Education Details social cues, looking at listener for understanding    Person(s) Educated Patient;Child(ren)    Methods Explanation;Demonstration;Verbal cues    Comprehension Need further instruction;Verbal cues required;Tactile cues required              SLP Short Term Goals - 06/05/21 0710       SLP SHORT TERM GOAL #1   Title Using SGD, pt will initiate one communication exchange with therapist in 8 out of 10 opportunities.    Baseline goal met during reporting period, goal revised  to reflect progress    Time 10    Period --   sessions   Status Revised      SLP SHORT TERM GOAL #2   Title Using SGD, pt will ask a question regarding ADLs in 8 out of 10 opportunities.    Baseline goal met during reporting period, revised to reflect progress    Time 10    Period --   sessions   Status Revised      SLP SHORT TERM GOAL #3   Title Pt and her daughter will create at least 3 functional utterances over the course of 5 sessions.    Baseline goal met during reporting period, goal revised to reflect progress    Time 10    Period --   sessions   Status Revised              SLP Long Term Goals - 06/05/21 2774       SLP LONG TERM GOAL #1   Title Using an SGD, pt will indicate 1 need in 3 out of 5 opportunities with communication partners.    Status On-going    Target Date 06/24/21       SLP LONG TERM GOAL #2   Title Using an SGD, pt will check-in for each therapy session.    Baseline new goal d/t great progress with ability using SGD    Time 4    Period Weeks    Status New    Target Date 06/24/21              Plan - 06/10/21 1730     Clinical Impression Statement Pt has made great progress in customizing and utilizing SGD for therapeutic use as well as functional communication with family members. Additionally, pt's speech intelligibility has increased at the single word level during basic functional responses. This however, remains intermittent and is limited d/t pt's severe apraxia of speech. Despite this progress pt requires maximal assistance to use device in a communicative way with others. As a result, skilled ST intervention is required to improve pt's multimodal communication, increase functional independence and reduce caregiver burden.    Speech Therapy Frequency 2x / week    Duration 12 weeks    Treatment/Interventions SLP instruction and feedback;Patient/family education;Functional tasks;Compensatory techniques;Language facilitation;Environmental controls;Multimodal communcation approach;Compensatory strategies;Internal/external aids;Cueing hierarchy    Potential to Achieve Goals Good    Consulted and Agree with Plan of Care Patient;Family member/caregiver    Family Member Consulted pt's daughter             Patient will benefit from skilled therapeutic intervention in order to improve the following deficits and impairments:   Aphasia  Apraxia  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    Problem List Patient Active Problem List   Diagnosis Date Noted   Aphasia, late effect of cerebrovascular disease 03/05/2021   Apraxia, late effect of cerebrovascular disease 03/05/2021   Sleep disturbance 03/05/2021   Acute on chronic renal failure ([place]) 03/05/2021   Slow transit constipation    Iron deficiency anemia    Benign essential HTN    Acute  ischemic left middle cerebral artery (MCA) stroke ([place]) 02/21/2021   Stroke (cerebrum) ([place]) 02/16/2021   Middle cerebral artery embolism, left 02/16/2021   Endotracheally intubated    Hypertensive urgency 09/29/2020   History of ischemic left MCA stroke 09/29/2020   Basal ganglia stroke ([place]) 09/29/2020   Malignant hypertension 09/29/2020   Obesity, Class III, BMI 40-49.9 (morbid obesity) (  [place]) 09/29/2020   Hyperlipidemia, mixed 09/29/2020   Reese Stockman B. Rutherford Nail M.S., CCC-SLP, [facility] Speech-Language Pathologist Rehabilitation Services Office [place], [place] 06/11/2021, 8:37 AM  [place] MAIN [facility] SERVICES [address] [place], [place], [address] Phone: [phone]   Fax:  [phone]   Name: [name] MRN: [id number] Date of Birth: [birth year]

## 2021-06-12 ENCOUNTER — Ambulatory Visit: Payer: Medicaid Other | Admitting: Speech Pathology

## 2021-06-17 ENCOUNTER — Other Ambulatory Visit: Payer: Self-pay

## 2021-06-17 ENCOUNTER — Ambulatory Visit: Payer: Medicaid Other | Attending: Registered Nurse | Admitting: Speech Pathology

## 2021-06-17 DIAGNOSIS — R482 Apraxia: Secondary | ICD-10-CM | POA: Diagnosis present

## 2021-06-17 DIAGNOSIS — I69390 Apraxia following cerebral infarction: Secondary | ICD-10-CM | POA: Insufficient documentation

## 2021-06-17 DIAGNOSIS — R4701 Aphasia: Secondary | ICD-10-CM | POA: Insufficient documentation

## 2021-06-17 DIAGNOSIS — I639 Cerebral infarction, unspecified: Secondary | ICD-10-CM | POA: Insufficient documentation

## 2021-06-18 ENCOUNTER — Encounter: Payer: Self-pay | Admitting: Occupational Therapy

## 2021-06-18 NOTE — Therapy (Signed)
[place] ?[place], [place], [address] ?Phone: [phone]   Fax:  [phone] ? ?Speech Language Pathology Treatment ? ?Patient Details  ?Name: [name] ?MRN: [id number] ?Date of Birth: 1976/03/04 ?Referring Provider (SLP): Danella Sensing ? ? ?Encounter Date: 06/17/2021 ? ? End of Session - 06/18/21 1410   ? ? Visit Number 13   ? Number of Visits 25   ? Date for SLP Re-Evaluation 06/24/21   ? Authorization Type [place] Medicaid Prepaid Health Plan   ? Authorization Time Period 04/01/2021 thru 06/24/2021   ? Authorization - Visit Number 2   ? Progress Note Due on Visit 10   ? SLP Start Time 1300   ? SLP Stop Time  1400   ? SLP Time Calculation (min) 60 min   ? Activity Tolerance Patient tolerated treatment well   ? ?  ?  ? ?  ? ? ?Past Medical History:  ?Diagnosis Date  ? Anemia   ? history of goiter   ? Hypertension   ? Low back pain   ? Stroke [facility])   ? ? ?Past Surgical History:  ?Procedure Laterality Date  ? CESAREAN SECTION    ? IR CT HEAD LTD  02/16/2021  ? IR CT HEAD LTD  02/16/2021  ? IR CT HEAD LTD  02/16/2021  ? IR CT HEAD LTD  02/16/2021  ? IR PERCUTANEOUS ART THROMBECTOMY/INFUSION INTRACRANIAL INC DIAG ANGIO  02/16/2021  ? RADIOLOGY WITH ANESTHESIA N/A 02/16/2021  ? Procedure: IR WITH ANESTHESIA;  Surgeon: Luanne Bras, MD;  Location: [place];  Service: Radiology;  Laterality: N/A;  ? THYROIDECTOMY    ? ? ?There were no vitals filed for this visit. ? ? Subjective Assessment - 06/18/21 0956   ? ? Subjective daughter reports - "she loves listening to music on it"   ? Patient is accompained by: Family member   ? Currently in Pain? No/denies   ? ?  ?  ? ?  ? ? ? ? ? ? ? ? ADULT SLP TREATMENT - 06/18/21 0001   ? ?  ? Treatment Provided  ? Treatment provided Cognitive-Linquistic   ?  ? Cognitive-Linquistic Treatment  ? Treatment focused on Aphasia;Apraxia;Patient/family/caregiver education   ? Skilled Treatment Skilled treatment session  focused on assessing pt's able to trace letters. Using the whiteboard feature on the SGD, SLP drew basic letter and instructed pt to trace. After several attempts, pt was able to trace "A, B, C, E, F." In addition pt was aware that of incorrect copying and continued to self-correct. After these 5 letters, pt began demonstrating perseverative motor patterns that weren't about to be changed.   ? ?  ?  ? ?  ? ? ? SLP Education - 06/18/21 1409   ? ? Education Details realistic expectations given the severity of deficits, history of multiple strokes and limited number of therapy sessions allowed by insurance   ? Person(s) Educated Patient;Child(ren);Parent(s)   ? Methods Explanation;Demonstration;Verbal cues   ? Comprehension Need further instruction;Returned demonstration;Verbalized understanding   ? ?  ?  ? ?  ? ? ? SLP Short Term Goals - 06/05/21 0710   ? ?  ? SLP SHORT TERM GOAL #1  ? Title Using SGD, pt will initiate one communication exchange with therapist in 8 out of 10 opportunities.   ? Baseline goal met during reporting period, goal revised to reflect progress   ? Time 10   ? Period --  sessions  ? Status Revised   ?  ? SLP SHORT TERM GOAL #2  ? Title Using SGD, pt will ask a question regarding ADLs in 8 out of 10 opportunities.   ? Baseline goal met during reporting period, revised to reflect progress   ? Time 10   ? Period --   sessions  ? Status Revised   ?  ? SLP SHORT TERM GOAL #3  ? Title Pt and her daughter will create at least 3 functional utterances over the course of 5 sessions.   ? Baseline goal met during reporting period, goal revised to reflect progress   ? Time 10   ? Period --   sessions  ? Status Revised   ? ?  ?  ? ?  ? ? ? SLP Long Term Goals - 06/05/21 0718   ? ?  ? SLP LONG TERM GOAL #1  ? Title Using an SGD, pt will indicate 1 need in 3 out of 5 opportunities with communication partners.   ? Status On-going   ? Target Date 06/24/21   ?  ? SLP LONG TERM GOAL #2  ? Title Using an SGD, pt  will check-in for each therapy session.   ? Baseline new goal d/t great progress with ability using SGD   ? Time 4   ? Period Weeks   ? Status New   ? Target Date 06/24/21   ? ?  ?  ? ?  ? ? ? Plan - 06/18/21 1410   ? ? Clinical Impression Statement Pt and her family continue to view pt's SGD as a therapy activity rather than a means of communication for the present and for the future. To help foster forward thinking about utterances for the device, SLP engaged pt's daughter in discussing her goals for pt at the 1 year anniversary of CVA (November 2023). Goals included independent verbal communication and independent ability to write. While ideally, we would love for this to happen, it is unrealistic at this time given the severity of current deficits (apraxia of speech, receptive -express aphasia) as well as vision deficits and motor apraxia impacting writing abilities. Pt also has history of strokes as well as limited number of sessions by insurance. [name] (pt's daughter) called pt's mother [name]) and I spoke with her via speakerphone. We went over the above information and need to use the SGD during ALL communication even if family already knows what pt needs. For example, pt's family understands pt's nonverbal expression as communicating need to use the bathroom but pt needs to use SGd to communicate this information so her functional independence will be improved.   ? Speech Therapy Frequency 2x / week   ? Duration 12 weeks   ? Treatment/Interventions SLP instruction and feedback;Patient/family education;Functional tasks;Compensatory techniques;Language facilitation;Environmental controls;Multimodal communcation approach;Compensatory strategies;Internal/external aids;Cueing hierarchy   ? Potential to Achieve Goals Good   ? Potential Considerations Severity of impairments   ? SLP Home Exercise Plan provided, see pt instructions section   ? Consulted and Agree with Plan of Care Patient;Family member/caregiver    ? Family Member Consulted pt's daughter, pt's mother [name]) via speakerphone   ? ?  ?  ? ?  ? ? ?Patient will benefit from skilled therapeutic intervention in order to improve the following deficits and impairments:   ?Aphasia ? ?Apraxia ? 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 ?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 [facility]) ? ? ? ?Problem List ?Patient Active Problem List  ?  Diagnosis Date Noted  ? Aphasia, late effect of cerebrovascular disease 03/05/2021  ? Apraxia, late effect of cerebrovascular disease 03/05/2021  ? Sleep disturbance 03/05/2021  ? Acute on chronic renal failure ([place]) 03/05/2021  ? Slow transit constipation   ? Iron deficiency anemia   ? Benign essential HTN   ? Acute ischemic left middle cerebral artery (MCA) stroke ([place]) 02/21/2021  ? Stroke (cerebrum) ([place]) 02/16/2021  ? Middle cerebral artery embolism, left 02/16/2021  ? Endotracheally intubated   ? Hypertensive urgency 09/29/2020  ? History of ischemic left MCA stroke 09/29/2020  ? Basal ganglia stroke ([place]) 09/29/2020  ? Malignant hypertension 09/29/2020  ? Obesity, Class III, BMI 40-49.9 (morbid obesity) ([place]) 09/29/2020  ? Hyperlipidemia, mixed 09/29/2020  ? ?Windle Huebert B. Rutherford Nail, M.S., CCC-SLP, CBIS ?Speech-Language Pathologist ?Rehabilitation Services ?Office [phone] ? ?[place], CCC-SLP ?06/18/2021, 2:14 PM ? ?[place] [facility] ?[place], [place], [address] ?Phone: [phone]   Fax:  [phone] ? ? ?Name: [name] ?MRN: [id number] ?Date of Birth: 19-Sep-1975 ? ?

## 2021-06-19 ENCOUNTER — Ambulatory Visit: Payer: Medicaid Other | Admitting: Speech Pathology

## 2021-06-19 ENCOUNTER — Other Ambulatory Visit: Payer: Self-pay

## 2021-06-19 DIAGNOSIS — R4701 Aphasia: Secondary | ICD-10-CM | POA: Diagnosis not present

## 2021-06-19 DIAGNOSIS — R482 Apraxia: Secondary | ICD-10-CM

## 2021-06-19 DIAGNOSIS — I639 Cerebral infarction, unspecified: Secondary | ICD-10-CM

## 2021-06-19 DIAGNOSIS — I69390 Apraxia following cerebral infarction: Secondary | ICD-10-CM

## 2021-06-21 NOTE — Therapy (Signed)
[place] ?[facility] ?[place], [place], [address] ?Phone: [phone]   Fax:  [phone] ? ?Speech Language Pathology Treatment ? ?Patient Details  ?Name: [name] ?MRN: [id number] ?Date of Birth: 1976-01-30 ?Referring Provider (SLP): Danella Sensing ? ? ?Encounter Date: 06/19/2021 ? ? End of Session - 06/21/21 2217   ? ? Visit Number 14   ? Number of Visits 25   ? Date for SLP Re-Evaluation 06/24/21   ? Authorization Type [place] Medicaid Prepaid Health Plan   ? Authorization Time Period 04/01/2021 thru 06/24/2021   ? Authorization - Visit Number 3   ? Progress Note Due on Visit 10   ? SLP Start Time 1400   ? SLP Stop Time  1500   ? SLP Time Calculation (min) 60 min   ? Activity Tolerance Patient tolerated treatment well   ? ?  ?  ? ?  ? ? ?Past Medical History:  ?Diagnosis Date  ? Anemia   ? history of goiter   ? Hypertension   ? Low back pain   ? Stroke [facility])   ? ? ?Past Surgical History:  ?Procedure Laterality Date  ? CESAREAN SECTION    ? IR CT HEAD LTD  02/16/2021  ? IR CT HEAD LTD  02/16/2021  ? IR CT HEAD LTD  02/16/2021  ? IR CT HEAD LTD  02/16/2021  ? IR PERCUTANEOUS ART THROMBECTOMY/INFUSION INTRACRANIAL INC DIAG ANGIO  02/16/2021  ? RADIOLOGY WITH ANESTHESIA N/A 02/16/2021  ? Procedure: IR WITH ANESTHESIA;  Surgeon: Luanne Bras, MD;  Location: [place];  Service: Radiology;  Laterality: N/A;  ? THYROIDECTOMY    ? ? ?There were no vitals filed for this visit. ? ? Subjective Assessment - 06/21/21 2212   ? ? Subjective pt pleasant, accompanied by her daughter and pt's mother Baker Janus)   ? Patient is accompained by: Family member   ? Currently in Pain? No/denies   ? ?  ?  ? ?  ? ? ? ? ? ? ? ? ADULT SLP TREATMENT - 06/21/21 0001   ? ?  ? Treatment Provided  ? Treatment provided Cognitive-Linquistic   ?  ? Cognitive-Linquistic Treatment  ? Treatment focused on Aphasia;Apraxia;Patient/family/caregiver education   ? Skilled Treatment Skilled treatment  session focused on pt's aphasia. SLP faciltiated session by providing maximal cues to identify basic letters and numbers in field of 3 to achieve ~ 50% accuracy. Depsite maximal multimodal assistance including using pennies, pt unable to count to 10 and unable to name amounts of different groups of pennies. Overall pt requires extensive demonstration to understand all of the above tasks.   ? ?  ?  ? ?  ? ? ? SLP Education - 06/21/21 2216   ? ? Education Details severity of deficits, aphasia (receptive/expressive) apraxia of speech, reading and writing deficits   ? Person(s) Educated Patient;Child(ren);Parent(s)   ? Methods Explanation;Demonstration;Verbal cues;Tactile cues   ? Comprehension Need further instruction;Verbalized understanding;Verbal cues required;Tactile cues required   ? ?  ?  ? ?  ? ? ? SLP Short Term Goals - 06/05/21 0710   ? ?  ? SLP SHORT TERM GOAL #1  ? Title Using SGD, pt will initiate one communication exchange with therapist in 8 out of 10 opportunities.   ? Baseline goal met during reporting period, goal revised to reflect progress   ? Time 10   ? Period --   sessions  ? Status Revised   ?  ?  SLP SHORT TERM GOAL #2  ? Title Using SGD, pt will ask a question regarding ADLs in 8 out of 10 opportunities.   ? Baseline goal met during reporting period, revised to reflect progress   ? Time 10   ? Period --   sessions  ? Status Revised   ?  ? SLP SHORT TERM GOAL #3  ? Title Pt and her daughter will create at least 3 functional utterances over the course of 5 sessions.   ? Baseline goal met during reporting period, goal revised to reflect progress   ? Time 10   ? Period --   sessions  ? Status Revised   ? ?  ?  ? ?  ? ? ? SLP Long Term Goals - 06/05/21 0718   ? ?  ? SLP LONG TERM GOAL #1  ? Title Using an SGD, pt will indicate 1 need in 3 out of 5 opportunities with communication partners.   ? Status On-going   ? Target Date 06/24/21   ?  ? SLP LONG TERM GOAL #2  ? Title Using an SGD, pt will  check-in for each therapy session.   ? Baseline new goal d/t great progress with ability using SGD   ? Time 4   ? Period Weeks   ? Status New   ? Target Date 06/24/21   ? ?  ?  ? ?  ? ? ? Plan - 06/21/21 2217   ? ? Clinical Impression Statement Pt arrived to session with SGD and printed letters - she stated, "I want to work on this." Pt continues to be eager to participate in session and demonstrates increased awareness with verbal errors. Additional instruction provied to pt's duaghter and pt's mother in pre-programming device for various activities to promote functional independent communication.   ? Speech Therapy Frequency 2x / week   ? Duration 12 weeks   ? Treatment/Interventions SLP instruction and feedback;Patient/family education;Functional tasks;Compensatory techniques;Language facilitation;Environmental controls;Multimodal communcation approach;Compensatory strategies;Internal/external aids;Cueing hierarchy   ? Potential to Achieve Goals Good   ? Potential Considerations Severity of impairments   ? SLP Home Exercise Plan provided, see pt instructions section   ? Consulted and Agree with Plan of Care Patient;Family member/caregiver   ? Family Member Consulted pt's daughter, pt's mother Baker Janus)   ? ?  ?  ? ?  ? ? ?Patient will benefit from skilled therapeutic intervention in order to improve the following deficits and impairments:   ?Aphasia ? ?Apraxia ? 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 ?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 [facility]) ? ? ? ?Problem List ?Patient Active Problem List  ? Diagnosis Date Noted  ? Aphasia, late effect of cerebrovascular disease 03/05/2021  ? Apraxia, late effect of cerebrovascular disease 03/05/2021  ? Sleep disturbance 03/05/2021  ? Acute on chronic renal failure ([place]) 03/05/2021  ? Slow transit constipation   ? Iron deficiency anemia   ? Benign essential HTN   ? Acute ischemic left middle cerebral artery (MCA) stroke ([place]) 02/21/2021  ? Stroke (cerebrum) ([place]) 02/16/2021  ?  Middle cerebral artery embolism, left 02/16/2021  ? Endotracheally intubated   ? Hypertensive urgency 09/29/2020  ? History of ischemic left MCA stroke 09/29/2020  ? Basal ganglia stroke ([place]) 09/29/2020  ? Malignant hypertension 09/29/2020  ? Obesity, Class III, BMI 40-49.9 (morbid obesity) ([place]) 09/29/2020  ? Hyperlipidemia, mixed 09/29/2020  ? ?Kanen Mottola B. Rutherford Nail, M.S., CCC-SLP, CBIS ?Speech-Language Pathologist ?Rehabilitation Services ?Office [phone] ? ?[place], [place] ?06/21/2021,  10:20 PM ? ?[place] [facility] ?[place], [place], [address] ?Phone: [phone]   Fax:  [phone] ? ? ?Name: Special Talbot Molina ?MRN: [id number] ?Date of Birth: 1975/10/09 ? ?

## 2021-06-21 NOTE — Patient Instructions (Signed)
Continue practicing selecting letters and numbers in field of 3 to build receptive language abilities ?

## 2021-06-24 ENCOUNTER — Ambulatory Visit: Payer: Medicaid Other | Admitting: Speech Pathology

## 2021-06-24 ENCOUNTER — Other Ambulatory Visit: Payer: Self-pay

## 2021-06-24 DIAGNOSIS — R482 Apraxia: Secondary | ICD-10-CM

## 2021-06-24 DIAGNOSIS — R4701 Aphasia: Secondary | ICD-10-CM

## 2021-06-24 DIAGNOSIS — I639 Cerebral infarction, unspecified: Secondary | ICD-10-CM

## 2021-06-24 DIAGNOSIS — I69390 Apraxia following cerebral infarction: Secondary | ICD-10-CM

## 2021-06-25 NOTE — Therapy (Signed)
[place] ?[facility] ?[address] ?[place], [place], [address] ?Phone: [phone]   Fax:  [phone] ? ?Speech Language Pathology Treatment ?RE-CERTIFICATION REQUEST ? ? ? ? ?Patient Details  ?Name: [name] ?MRN: [id number] ?Date of Birth: 1975-12-30 ?Referring Provider (SLP): Jacalyn Lefevre ? ? ?Encounter Date: 06/24/2021 ? ? End of Session - 06/24/21 1600   ? ? Visit Number 15   ? Number of Visits 39   ? Date for SLP Re-Evaluation 09/17/21   ? Authorization Type [place] Medicaid Prepaid Health Plan   ? Authorization Time Period 04/01/2021 thru 06/24/2021   ? Authorization - Visit Number 4   ? Progress Note Due on Visit 10   ? SLP Start Time 1400   ? SLP Stop Time  1500   ? SLP Time Calculation (min) 60 min   ? Activity Tolerance Patient tolerated treatment well   ? ?  ?  ? ?  ? ? ?Past Medical History:  ?Diagnosis Date  ? Anemia   ? history of goiter   ? Hypertension   ? Low back pain   ? Stroke [facility])   ? ? ?Past Surgical History:  ?Procedure Laterality Date  ? CESAREAN SECTION    ? IR CT HEAD LTD  02/16/2021  ? IR CT HEAD LTD  02/16/2021  ? IR CT HEAD LTD  02/16/2021  ? IR CT HEAD LTD  02/16/2021  ? IR PERCUTANEOUS ART THROMBECTOMY/INFUSION INTRACRANIAL INC DIAG ANGIO  02/16/2021  ? RADIOLOGY WITH ANESTHESIA N/A 02/16/2021  ? Procedure: IR WITH ANESTHESIA;  Surgeon: Julieanne Cotton, MD;  [facility];  Service: Radiology;  Laterality: N/A;  ? THYROIDECTOMY    ? ? ?There were no vitals filed for this visit. ? ? Subjective Assessment - 06/24/21 1557   ? ? Subjective pt pleasant, accompanied by her daughter   ? Patient is accompained by: Family member   ? Currently in Pain? No/denies   ? ?  ?  ? ?  ? ? ? ? ? ? ? ? ADULT SLP TREATMENT - 06/25/21 0001   ? ?  ? Treatment Provided  ? Treatment provided Cognitive-Linquistic   ?  ? Cognitive-Linquistic Treatment  ? Treatment focused on Aphasia;Apraxia;Patient/family/caregiver education   ? Skilled Treatment Skilled  treatment session focused on pt's receptive language abilities during functional tasks. SLP facilitated session by providing a [place] Day activity. Pt demonstrated independence with basic verbal directions and she was able to physically compensate for vision deficits for accurate task completion. SLP further facilitated session by providing verbal counting. Pt was not able to imitate counting d/t veral perseveration of "6"   ? ?  ?  ? ?  ? ? ? SLP Education - 06/24/21 1600   ? ? Education Details improved receptive language skills   ? Person(s) Educated Patient;Child(ren)   ? Methods Explanation;Demonstration;Verbal cues   ? Comprehension Verbalized understanding;Returned demonstration   ? ?  ?  ? ?  ? ? ? SLP Short Term Goals - 06/24/21 1607   ? ?  ? SLP SHORT TERM GOAL #1  ? Title With moderate assistance, pt will use her SGD for one communication exchange with therapist in 8 out of 10 opportunities.   ? Baseline total assistance required   ? Time 10   ? Period --   sessions  ? Status Revised   ?  ? SLP SHORT TERM GOAL #2  ? Title Pt will answer questions about  herself with SGD given moderate cues in 8 out of 10 opportunities.   ? Baseline goal discontinued, revised   ? Time 10   ? Period --   sessions  ? Status New   ?  ? SLP SHORT TERM GOAL #3  ? Title With caregiver assistance, add 3 new phrases describing recent events to her SGD each week to supplement expressive skills and increase conversational participation.   ? Baseline new goal   ? Time 10   ? Period --   sessions  ? Status Revised   ? ?  ?  ? ?  ? ? ? SLP Long Term Goals - 06/24/21 1607   ? ?  ? SLP LONG TERM GOAL #1  ? Title Using an SGD, pt will indicate 1 need in 3 out of 5 opportunities with communication partners.   ? Time 12   ? Period Weeks   ? Status On-going   ? Target Date 09/17/21   ?  ? SLP LONG TERM GOAL #2  ? Title Using an SGD, pt will check-in for each therapy session.   ? Time 12   ? Period Weeks   ? Status On-going   ? Target  Date 09/17/21   ? ?  ?  ? ?  ? ? ? Plan - 06/24/21 1606   ? ? Clinical Impression Statement Pt presents with severe apraxia of speech, expressive > receptive aphasia resulting in inability to communicate wants and needs verbally. Pt and her family have responded well to using SGD and it continues to be viable mode of communication for pt. As such, skilled ST intervention continues to be required to target the above impairments to increase pt's functional ability to communicate her want/s needs as well as participate in leisure and community activities.   ? Speech Therapy Frequency 1x /week   ? Duration 12 weeks   ? Treatment/Interventions SLP instruction and feedback;Patient/family education;Functional tasks;Compensatory techniques;Language facilitation;Environmental controls;Multimodal communcation approach;Compensatory strategies;Internal/external aids;Cueing hierarchy   ? Potential to Achieve Goals Good   ? Potential Considerations Severity of impairments   ? Consulted and Agree with Plan of Care Patient;Family member/caregiver   ? Family Member Consulted pt's daughter   ? ?  ?  ? ?  ? ? ?Patient will benefit from skilled therapeutic intervention in order to improve the following deficits and impairments:   ?Aphasia ? ?Apraxia ? 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 ?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 [facility]) ? ? ? ?Problem List ?Patient Active Problem List  ? Diagnosis Date Noted  ? Aphasia, late effect of cerebrovascular disease 03/05/2021  ? Apraxia, late effect of cerebrovascular disease 03/05/2021  ? Sleep disturbance 03/05/2021  ? Acute on chronic renal failure ([facility]) 03/05/2021  ? Slow transit constipation   ? Iron deficiency anemia   ? Benign essential HTN   ? Acute ischemic left middle cerebral artery (MCA) stroke ([facility]) 02/21/2021  ? Stroke (cerebrum) ([facility]) 02/16/2021  ? Middle cerebral artery embolism, left 02/16/2021  ? Endotracheally intubated   ? Hypertensive urgency 09/29/2020  ? History of ischemic  left MCA stroke 09/29/2020  ? Basal ganglia stroke ([facility]) 09/29/2020  ? Malignant hypertension 09/29/2020  ? Obesity, Class III, BMI 40-49.9 (morbid obesity) ([facility]) 09/29/2020  ? Hyperlipidemia, mixed 09/29/2020  ? ?Dalin Caldera B. Dreama Saaverton, M.S., CCC-SLP, CBIS ?Speech-Language Pathologist ?Rehabilitation Services ?Office [phone] ? ?Glendine Swetz, CCC-SLP ?06/25/2021, 8:23 AM ? ?[place] ?[facility] ?[address] ?[place], [place],  [address] ?Phone: [phone]   Fax:  [phone] ? ? ?Name: [name] ?MRN: [id number] ?Date of Birth: 06-05-1975 ? ?

## 2021-07-01 ENCOUNTER — Other Ambulatory Visit: Payer: Self-pay

## 2021-07-01 ENCOUNTER — Ambulatory Visit: Payer: Medicaid Other | Admitting: Speech Pathology

## 2021-07-01 DIAGNOSIS — R4701 Aphasia: Secondary | ICD-10-CM

## 2021-07-01 DIAGNOSIS — R482 Apraxia: Secondary | ICD-10-CM

## 2021-07-01 DIAGNOSIS — I639 Cerebral infarction, unspecified: Secondary | ICD-10-CM

## 2021-07-01 DIAGNOSIS — I69390 Apraxia following cerebral infarction: Secondary | ICD-10-CM

## 2021-07-03 NOTE — Therapy (Signed)
[place] ?[facility] ?[address] ?[place], [place], [address] ?Phone: [phone]   Fax:  [phone] ? ?Speech Language Pathology Treatment ? ?Patient Details  ?Name: [name] ?MRN: [id number] ?Date of Birth: [birth year] ?Referring Provider (SLP): Jacalyn LefevreEunice Thomas ? ? ?Encounter Date: 07/01/2021 ? ? End of Session - 07/03/21 0936   ? ? Visit Number 16   ? Number of Visits 39   ? Date for SLP Re-Evaluation 09/16/21   ? Authorization Type [place] Medicaid Prepaid Health Plan   ? Authorization Time Period 04/01/2021 thru 06/24/2021   ? Authorization - Visit Number 5   ? Progress Note Due on Visit 10   ? SLP Start Time 1400   ? SLP Stop Time  1500   ? SLP Time Calculation (min) 60 min   ? Activity Tolerance Patient tolerated treatment well   ? ?  ?  ? ?  ? ? ?Past Medical History:  ?Diagnosis Date  ? Anemia   ? history of goiter   ? Hypertension   ? Low back pain   ? Stroke [facility](HCC)   ? ? ?Past Surgical History:  ?Procedure Laterality Date  ? CESAREAN SECTION    ? IR CT HEAD LTD  02/16/2021  ? IR CT HEAD LTD  02/16/2021  ? IR CT HEAD LTD  02/16/2021  ? IR CT HEAD LTD  02/16/2021  ? IR PERCUTANEOUS ART THROMBECTOMY/INFUSION INTRACRANIAL INC DIAG ANGIO  02/16/2021  ? RADIOLOGY WITH ANESTHESIA N/A 02/16/2021  ? Procedure: IR WITH ANESTHESIA;  Surgeon: Julieanne Cottoneveshwar, Sanjeev, MD;  [facility];  Service: Radiology;  Laterality: N/A;  ? THYROIDECTOMY    ? ? ?There were no vitals filed for this visit. ? ? Subjective Assessment - 07/03/21 0932   ? ? Subjective pt arrived in [facility] chair - her mother reports that pt has seizure last Friday (06/27/2021) and [facility]   ? Patient is accompained by: Family member   ? Currently in Pain? No/denies   ? ?  ?  ? ?  ? ? ? ? ? ? ? ? ADULT SLP TREATMENT - 07/03/21 0001   ? ?  ? Treatment Provided  ? Treatment provided Cognitive-Linquistic   ?  ? Cognitive-Linquistic Treatment  ? Treatment focused on  Aphasia;Apraxia;Patient/family/caregiver education   ? Skilled Treatment Skilled treatment session focused on pt's language impairments, specifically pt's use of SGD and development of her receptive language skills. SLP facilitated session by providing the following interventions:    Receptive Language: Using SGD with no text - pt was able to demonstrate comprehension of inferred communication by selecting appropriate icon from field of 3 in the following categories:    ADLs - 4 of 5  Comfort - 3 of 4  Months - 2 of 4  Season - 1 of 2  Holidays - 4 of 4  Numbers 3 of 6   ? ?  ?  ? ?  ? ? ? SLP Education - 07/03/21 0935   ? ? Education Details ways to use SGD to target receptive language deficits   ? Person(s) Educated Patient;Child(ren)   ? Methods Explanation;Demonstration;Verbal cues   ? Comprehension Verbalized understanding;Returned demonstration   ? ?  ?  ? ?  ? ? ? SLP Short Term Goals - 06/24/21 1607   ? ?  ? SLP SHORT TERM GOAL #1  ? Title With moderate assistance, pt will use her SGD for one communication  exchange with therapist in 8 out of 10 opportunities.   ? Baseline total assistance required   ? Time 10   ? Period --   sessions  ? Status Revised   ?  ? SLP SHORT TERM GOAL #2  ? Title Pt will answer questions about herself with SGD given moderate cues in 8 out of 10 opportunities.   ? Baseline goal discontinued, revised   ? Time 10   ? Period --   sessions  ? Status New   ?  ? SLP SHORT TERM GOAL #3  ? Title With caregiver assistance, add 3 new phrases describing recent events to her SGD each week to supplement expressive skills and increase conversational participation.   ? Baseline new goal   ? Time 10   ? Period --   sessions  ? Status Revised   ? ?  ?  ? ?  ? ? ? SLP Long Term Goals - 06/24/21 1607   ? ?  ? SLP LONG TERM GOAL #1  ? Title Using an SGD, pt will indicate 1 need in 3 out of 5 opportunities with communication partners.   ? Time 12   ? Period Weeks   ? Status On-going   ? Target Date  09/17/21   ?  ? SLP LONG TERM GOAL #2  ? Title Using an SGD, pt will check-in for each therapy session.   ? Time 12   ? Period Weeks   ? Status On-going   ? Target Date 09/17/21   ? ?  ?  ? ?  ? ? ? Plan - 07/03/21 0940   ? ? Clinical Impression Statement Pt presents with severe apraxia of speech, expressive > receptive aphasia resulting in inability to communicate wants and needs verbally. Pt and her family have responded well to using SGD and it continues to be viable mode of communication for pt. As such, skilled ST intervention continues to be required to target the above impairments to increase pt's functional ability to communicate her want/s needs as well as participate in leisure and community activities.   ? Speech Therapy Frequency 1x /week   ? Duration 12 weeks   ? Treatment/Interventions SLP instruction and feedback;Patient/family education;Functional tasks;Compensatory techniques;Language facilitation;Environmental controls;Multimodal communcation approach;Compensatory strategies;Internal/external aids;Cueing hierarchy   ? Potential to Achieve Goals Good   ? Potential Considerations Severity of impairments   ? SLP Home Exercise Plan provided, see pt instructions section   ? Consulted and Agree with Plan of Care Patient;Family member/caregiver   ? Family Member Consulted pt's daughter and pt's mother   ? ?  ?  ? ?  ? ? ?Patient will benefit from skilled therapeutic intervention in order to improve the following deficits and impairments:   ?Aphasia ? ?Apraxia ? 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 ?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 [facility]) ? ? ? ?Problem List ?Patient Active Problem List  ? Diagnosis Date Noted  ? Aphasia, late effect of cerebrovascular disease 03/05/2021  ? Apraxia, late effect of cerebrovascular disease 03/05/2021  ? Sleep disturbance 03/05/2021  ? Acute on chronic renal failure (HCC) 03/05/2021  ? Slow transit constipation   ? Iron deficiency anemia   ? Benign essential HTN   ? Acute  ischemic left middle cerebral artery (MCA) stroke (HCC) 02/21/2021  ? Stroke (cerebrum) (HCC) 02/16/2021  ? Middle cerebral artery embolism, left 02/16/2021  ? Endotracheally intubated   ? Hypertensive urgency 09/29/2020  ? History of ischemic left MCA stroke  09/29/2020  ? Basal ganglia stroke (HCC) 09/29/2020  ? Malignant hypertension 09/29/2020  ? Obesity, Class III, BMI 40-49.9 (morbid obesity) (HCC) 09/29/2020  ? Hyperlipidemia, mixed 09/29/2020  ? ?Janeliz [place]wood B. Dreama Molina, M.S., CCC-SLP, CBIS ?Speech-Language Pathologist ?Rehabilitation Services ?Office [phone] ? ?[name], CCC-SLP ?07/03/2021, 9:41 AM ? ?[place] ?[facility] ?[address] ?[place], [place], [address] ?Phone: [phone]   Fax:  [phone] ? ? ?Name: [name] ?MRN: [id number] ?Date of Birth: September 26, 1975 ? ?

## 2021-07-03 NOTE — Patient Instructions (Signed)
Use SGD to promote receptive language abilities ?

## 2021-07-08 ENCOUNTER — Ambulatory Visit: Payer: Medicaid Other | Admitting: Speech Pathology

## 2021-07-08 ENCOUNTER — Other Ambulatory Visit: Payer: Self-pay

## 2021-07-08 DIAGNOSIS — I69390 Apraxia following cerebral infarction: Secondary | ICD-10-CM

## 2021-07-08 DIAGNOSIS — R4701 Aphasia: Secondary | ICD-10-CM | POA: Diagnosis not present

## 2021-07-08 DIAGNOSIS — R482 Apraxia: Secondary | ICD-10-CM

## 2021-07-08 DIAGNOSIS — I639 Cerebral infarction, unspecified: Secondary | ICD-10-CM

## 2021-07-09 NOTE — Therapy (Signed)
[place] ?[facility] ?[address] ?[place], [place], [address] ?Phone: [phone]   Fax:  [phone] ? ?Speech Language Pathology Treatment ? ?Patient Details  ?Name: [name] ?MRN: [id number] ?Date of Birth: 08-27-1975 ?Referring Provider (SLP): Jacalyn Lefevre ? ? ?Encounter Date: 07/08/2021 ? ? End of Session - 07/09/21 2054   ? ? Visit Number 17   ? Number of Visits 39   ? Date for SLP Re-Evaluation 09/16/21   ? Authorization Type [place] Medicaid Prepaid Health Plan   ? Authorization Time Period 06/24/2021 thru 09/16/2021   ? Authorization - Visit Number 6   ? Progress Note Due on Visit 10   ? SLP Start Time 1300   ? SLP Stop Time  1400   ? SLP Time Calculation (min) 60 min   ? Activity Tolerance Patient tolerated treatment well   ? ?  ?  ? ?  ? ? ?Past Medical History:  ?Diagnosis Date  ? Anemia   ? history of goiter   ? Hypertension   ? Low back pain   ? Stroke [facility])   ? ? ?Past Surgical History:  ?Procedure Laterality Date  ? CESAREAN SECTION    ? IR CT HEAD LTD  02/16/2021  ? IR CT HEAD LTD  02/16/2021  ? IR CT HEAD LTD  02/16/2021  ? IR CT HEAD LTD  02/16/2021  ? IR PERCUTANEOUS ART THROMBECTOMY/INFUSION INTRACRANIAL INC DIAG ANGIO  02/16/2021  ? RADIOLOGY WITH ANESTHESIA N/A 02/16/2021  ? Procedure: IR WITH ANESTHESIA;  Surgeon: Julieanne Cotton, MD;  [facility];  Service: Radiology;  Laterality: N/A;  ? THYROIDECTOMY    ? ? ?There were no vitals filed for this visit. ? ? Subjective Assessment - 07/09/21 2021   ? ? Subjective Pt appeared in much better spirits, no flat affect   ? Patient is accompained by: Family member   ? Currently in Pain? No/denies   ? ?  ?  ? ?  ? ? ? ? ? ? ? ? ADULT SLP TREATMENT - 07/09/21 0001   ? ?  ? Treatment Provided  ? Treatment provided Cognitive-Linquistic   ?  ? Cognitive-Linquistic Treatment  ? Treatment focused on Aphasia;Apraxia;Patient/family/caregiver education   ? Skilled Treatment Skilled treatment session  focused on providing written information on SGD and future treatments, acquired address for SGD to be shipped to, SLP further facilitated session by providing instruction in using SGD for fast talk communication for bathroom.   ? ?  ?  ? ?  ? ? ? SLP Education - 07/09/21 2053   ? ? Education Details ST POC   ? Person(s) Educated Patient;Child(ren)   ? Methods Explanation   ? Comprehension Verbalized understanding;Returned demonstration   ? ?  ?  ? ?  ? ? ? SLP Short Term Goals - 06/24/21 1607   ? ?  ? SLP SHORT TERM GOAL #1  ? Title With moderate assistance, pt will use her SGD for one communication exchange with therapist in 8 out of 10 opportunities.   ? Baseline total assistance required   ? Time 10   ? Period --   sessions  ? Status Revised   ?  ? SLP SHORT TERM GOAL #2  ? Title Pt will answer questions about herself with SGD given moderate cues in 8 out of 10 opportunities.   ? Baseline goal discontinued, revised   ? Time 10   ? Period --  sessions  ? Status New   ?  ? SLP SHORT TERM GOAL #3  ? Title With caregiver assistance, add 3 new phrases describing recent events to her SGD each week to supplement expressive skills and increase conversational participation.   ? Baseline new goal   ? Time 10   ? Period --   sessions  ? Status Revised   ? ?  ?  ? ?  ? ? ? SLP Long Term Goals - 06/24/21 1607   ? ?  ? SLP LONG TERM GOAL #1  ? Title Using an SGD, pt will indicate 1 need in 3 out of 5 opportunities with communication partners.   ? Time 12   ? Period Weeks   ? Status On-going   ? Target Date 09/17/21   ?  ? SLP LONG TERM GOAL #2  ? Title Using an SGD, pt will check-in for each therapy session.   ? Time 12   ? Period Weeks   ? Status On-going   ? Target Date 09/17/21   ? ?  ?  ? ?  ? ? ? Plan - 07/09/21 2055   ? ? Clinical Impression Statement Pt presents with severe apraxia of speech, expressive > receptive aphasia resulting in inability to communicate wants and needs verbally. Pt and her family have responded  well to using SGD and it continues to be viable mode of communication for pt. As such, skilled ST intervention continues to be required to target the above impairments to increase pt's functional ability to communicate her want/s needs as well as participate in leisure and community activities.   ? Speech Therapy Frequency 2x / week   ? Duration 12 weeks   ? Treatment/Interventions SLP instruction and feedback;Patient/family education;Functional tasks;Compensatory techniques;Language facilitation;Environmental controls;Multimodal communcation approach;Compensatory strategies;Internal/external aids;Cueing hierarchy   ? Potential to Achieve Goals Good   ? Consulted and Agree with Plan of Care Patient;Family member/caregiver   ? Family Member Consulted pt's daughter   ? ?  ?  ? ?  ? ? ?Patient will benefit from skilled therapeutic intervention in order to improve the following deficits and impairments:   ?Aphasia ? ?Apraxia ? 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 ?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 [facility]) ? ? ? ?Problem List ?Patient Active Problem List  ? Diagnosis Date Noted  ? Aphasia, late effect of cerebrovascular disease 03/05/2021  ? Apraxia, late effect of cerebrovascular disease 03/05/2021  ? Sleep disturbance 03/05/2021  ? Acute on chronic renal failure ([place]) 03/05/2021  ? Slow transit constipation   ? Iron deficiency anemia   ? Benign essential HTN   ? Acute ischemic left middle cerebral artery (MCA) stroke ([place]) 02/21/2021  ? Stroke (cerebrum) ([place]) 02/16/2021  ? Middle cerebral artery embolism, left 02/16/2021  ? Endotracheally intubated   ? Hypertensive urgency 09/29/2020  ? History of ischemic left MCA stroke 09/29/2020  ? Basal ganglia stroke ([place]) 09/29/2020  ? Malignant hypertension 09/29/2020  ? Obesity, Class III, BMI 40-49.9 (morbid obesity) ([place]) 09/29/2020  ? Hyperlipidemia, mixed 09/29/2020  ? ?Tykira Wachs B. Rutherford Nail, M.S., CCC-SLP, CBIS ?Speech-Language Pathologist ?Rehabilitation Services ?Office  [phone] ? ?[place], [place] ?07/09/2021, 8:57 PM ? ?[place] ?[facility] ?[place], [place], [address] ?Phone: [phone]   Fax:  [phone] ? ? ?Name: [name] ?MRN: [id number] ?Date of Birth: [birth year] ? ?

## 2021-07-10 ENCOUNTER — Ambulatory Visit: Payer: Medicaid Other | Admitting: Speech Pathology

## 2021-07-10 DIAGNOSIS — R482 Apraxia: Secondary | ICD-10-CM

## 2021-07-10 DIAGNOSIS — R4701 Aphasia: Secondary | ICD-10-CM

## 2021-07-10 DIAGNOSIS — I639 Cerebral infarction, unspecified: Secondary | ICD-10-CM

## 2021-07-10 DIAGNOSIS — I69390 Apraxia following cerebral infarction: Secondary | ICD-10-CM

## 2021-07-11 NOTE — Therapy (Signed)
[place] ?[facility] ?[place], [place], [address] ?Phone: [phone]   Fax:  [phone] ? ?Speech Language Pathology Treatment ? ?Patient Details  ?Name: [name] ?MRN: [id number] ?Date of Birth: 1975/05/24 ?Referring Provider (SLP): Danella Sensing ? ? ?Encounter Date: 07/10/2021 ? ? End of Session - 07/11/21 1632   ? ? Visit Number 18   ? Number of Visits 39   ? Date for SLP Re-Evaluation 09/16/21   ? Authorization Type [place] Medicaid Prepaid Health Plan   ? Authorization Time Period 06/24/2021 thru 09/16/2021   ? Authorization - Visit Number 8   ? Authorization - Number of Visits 12   ? Progress Note Due on Visit 10   ? SLP Start Time 1100   ? SLP Stop Time  1200   ? SLP Time Calculation (min) 60 min   ? Activity Tolerance Patient tolerated treatment well   ? ?  ?  ? ?  ? ? ?Past Medical History:  ?Diagnosis Date  ? Anemia   ? history of goiter   ? Hypertension   ? Low back pain   ? Stroke [facility])   ? ? ?Past Surgical History:  ?Procedure Laterality Date  ? CESAREAN SECTION    ? IR CT HEAD LTD  02/16/2021  ? IR CT HEAD LTD  02/16/2021  ? IR CT HEAD LTD  02/16/2021  ? IR CT HEAD LTD  02/16/2021  ? IR PERCUTANEOUS ART THROMBECTOMY/INFUSION INTRACRANIAL INC DIAG ANGIO  02/16/2021  ? RADIOLOGY WITH ANESTHESIA N/A 02/16/2021  ? Procedure: IR WITH ANESTHESIA;  Surgeon: Luanne Bras, MD;  Location: [place];  Service: Radiology;  Laterality: N/A;  ? THYROIDECTOMY    ? ? ?There were no vitals filed for this visit. ? ? Subjective Assessment - 07/11/21 1627   ? ? Subjective pt pleasant, always eager to participate   ? Patient is accompained by: Family member   ? Currently in Pain? No/denies   ? ?  ?  ? ?  ? ? ? ? ? ? ? ? ADULT SLP TREATMENT - 07/11/21 0001   ? ?  ? Treatment Provided  ? Treatment provided Cognitive-Linquistic   ?  ? Cognitive-Linquistic Treatment  ? Treatment focused on Aphasia;Apraxia;Patient/family/caregiver education   ? Skilled Treatment  Skilled treatment session focused on visual scanning. SLP faciltiated session by changing settings on pt's SGD to provide 5 icons instead of 3. Pt presented with known icons to compensate for language impairment. Pt's accuracy in selecting requested icons decreased significantly with 5 icons present.   ? ?  ?  ? ?  ? ? ? SLP Education - 07/11/21 1632   ? ? Education Details icon size   ? Person(s) Educated Patient;Child(ren)   ? Methods Explanation;Demonstration   ? Comprehension Verbalized understanding;Returned demonstration   ? ?  ?  ? ?  ? ? ? SLP Short Term Goals - 06/24/21 1607   ? ?  ? SLP SHORT TERM GOAL #1  ? Title With moderate assistance, pt will use her SGD for one communication exchange with therapist in 8 out of 10 opportunities.   ? Baseline total assistance required   ? Time 10   ? Period --   sessions  ? Status Revised   ?  ? SLP SHORT TERM GOAL #2  ? Title Pt will answer questions about herself with SGD given moderate cues in 8 out of 10 opportunities.   ? Baseline goal  discontinued, revised   ? Time 10   ? Period --   sessions  ? Status New   ?  ? SLP SHORT TERM GOAL #3  ? Title With caregiver assistance, add 3 new phrases describing recent events to her SGD each week to supplement expressive skills and increase conversational participation.   ? Baseline new goal   ? Time 10   ? Period --   sessions  ? Status Revised   ? ?  ?  ? ?  ? ? ? SLP Long Term Goals - 06/24/21 1607   ? ?  ? SLP LONG TERM GOAL #1  ? Title Using an SGD, pt will indicate 1 need in 3 out of 5 opportunities with communication partners.   ? Time 12   ? Period Weeks   ? Status On-going   ? Target Date 09/17/21   ?  ? SLP LONG TERM GOAL #2  ? Title Using an SGD, pt will check-in for each therapy session.   ? Time 12   ? Period Weeks   ? Status On-going   ? Target Date 09/17/21   ? ?  ?  ? ?  ? ? ? Plan - 07/11/21 1633   ? ? Clinical Impression Statement Pt presents with severe apraxia of speech, expressive > receptive aphasia  resulting in inability to communicate wants and needs verbally. Pt and her family have responded well to using SGD and it continues to be viable mode of communication for pt. As such, skilled ST intervention continues to be required to target the above impairments to increase pt's functional ability to communicate her want/s needs as well as participate in leisure and community activities.   ? Speech Therapy Frequency 2x / week   ? Duration 12 weeks   ? Treatment/Interventions SLP instruction and feedback;Patient/family education;Functional tasks;Compensatory techniques;Language facilitation;Environmental controls;Multimodal communcation approach;Compensatory strategies;Internal/external aids;Cueing hierarchy   ? Potential to Achieve Goals Good   ? SLP Home Exercise Plan provided, see pt instructions section   ? Consulted and Agree with Plan of Care Patient;Family member/caregiver   ? Family Member Consulted pt's daughter   ? ?  ?  ? ?  ? ? ?Patient will benefit from skilled therapeutic intervention in order to improve the following deficits and impairments:   ?Aphasia ? ?Apraxia ? 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 ?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 [facility]) ? ? ? ?Problem List ?Patient Active Problem List  ? Diagnosis Date Noted  ? Aphasia, late effect of cerebrovascular disease 03/05/2021  ? Apraxia, late effect of cerebrovascular disease 03/05/2021  ? Sleep disturbance 03/05/2021  ? Acute on chronic renal failure ([place]) 03/05/2021  ? Slow transit constipation   ? Iron deficiency anemia   ? Benign essential HTN   ? Acute ischemic left middle cerebral artery (MCA) stroke ([place]) 02/21/2021  ? Stroke (cerebrum) ([place]) 02/16/2021  ? Middle cerebral artery embolism, left 02/16/2021  ? Endotracheally intubated   ? Hypertensive urgency 09/29/2020  ? History of ischemic left MCA stroke 09/29/2020  ? Basal ganglia stroke ([place]) 09/29/2020  ? Malignant hypertension 09/29/2020  ? Obesity, Class III, BMI 40-49.9 (morbid  obesity) ([place]) 09/29/2020  ? Hyperlipidemia, mixed 09/29/2020  ? ?Shawne Eskelson B. Rutherford Nail, M.S., CCC-SLP, CBIS ?Speech-Language Pathologist ?Rehabilitation Services ?Office [phone] ? ?[place], CCC-SLP ?07/11/2021, 4:33 PM ? ?[place] ?[facility] ?[place], [place], [address] ?Phone: [phone]   Fax:  [phone] ? ? ?Name: Neeva  Diane Kenton [place] ?MRN: [id number] ?Date of Birth: 30-Mar-1976 ? ?

## 2021-07-15 ENCOUNTER — Ambulatory Visit: Payer: Medicaid Other | Attending: Registered Nurse | Admitting: Speech Pathology

## 2021-07-15 DIAGNOSIS — I69390 Apraxia following cerebral infarction: Secondary | ICD-10-CM | POA: Insufficient documentation

## 2021-07-15 DIAGNOSIS — I6381 Other cerebral infarction due to occlusion or stenosis of small artery: Secondary | ICD-10-CM | POA: Insufficient documentation

## 2021-07-15 DIAGNOSIS — I639 Cerebral infarction, unspecified: Secondary | ICD-10-CM | POA: Insufficient documentation

## 2021-07-15 DIAGNOSIS — R482 Apraxia: Secondary | ICD-10-CM | POA: Insufficient documentation

## 2021-07-15 DIAGNOSIS — R4701 Aphasia: Secondary | ICD-10-CM | POA: Diagnosis present

## 2021-07-15 DIAGNOSIS — R41841 Cognitive communication deficit: Secondary | ICD-10-CM | POA: Insufficient documentation

## 2021-07-16 NOTE — Therapy (Signed)
[place] ?[facility] ?[address] ?[place], [place], [address] ?Phone: [phone]   Fax:  [phone] ? ?Speech Language Pathology Treatment ? ?Patient Details  ?Name: [name] ?MRN: [id number] ?Date of Birth: May 04, 1975 ?Referring Provider (SLP): Jacalyn LefevreEunice Thomas ? ? ?Encounter Date: 07/15/2021 ? ? End of Session - 07/16/21 1154   ? ? Visit Number 19   ? Number of Visits 39   ? Date for SLP Re-Evaluation 09/16/21   ? Authorization Type [place] Medicaid Prepaid Health Plan   ? Authorization Time Period 06/24/2021 thru 09/16/2021   ? Authorization - Visit Number 9   ? Authorization - Number of Visits 12   ? Progress Note Due on Visit 10   ? SLP Start Time 1300   ? SLP Stop Time  1400   ? SLP Time Calculation (min) 60 min   ? Activity Tolerance Patient tolerated treatment well   ? ?  ?  ? ?  ? ? ?Past Medical History:  ?Diagnosis Date  ? Anemia   ? history of goiter   ? Hypertension   ? Low back pain   ? Stroke [facility](HCC)   ? ? ?Past Surgical History:  ?Procedure Laterality Date  ? CESAREAN SECTION    ? IR CT HEAD LTD  02/16/2021  ? IR CT HEAD LTD  02/16/2021  ? IR CT HEAD LTD  02/16/2021  ? IR CT HEAD LTD  02/16/2021  ? IR PERCUTANEOUS ART THROMBECTOMY/INFUSION INTRACRANIAL INC DIAG ANGIO  02/16/2021  ? RADIOLOGY WITH ANESTHESIA N/A 02/16/2021  ? Procedure: IR WITH ANESTHESIA;  Surgeon: Julieanne Cottoneveshwar, Sanjeev, MD;  Location: [facility];  Service: Radiology;  Laterality: N/A;  ? THYROIDECTOMY    ? ? ?There were no vitals filed for this visit. ? ? Subjective Assessment - 07/16/21 1055   ? ? Subjective pt very excited to have received her device in the mail   ? Patient is accompained by: Family member   ? Currently in Pain? No/denies   ? ?  ?  ? ?  ? ? ? ? ? ? ? ? ADULT SLP TREATMENT - 07/16/21 0001   ? ?  ? Treatment Provided  ? Treatment provided Cognitive-Linquistic   ?  ? Cognitive-Linquistic Treatment  ? Treatment focused on Aphasia;Apraxia;Patient/family/caregiver education   ?  Skilled Treatment Skilled treatment session focused on pt's language impairments, specifically setting up her SGD, tasks to promote functional language within activities.      ? ?SLP facilitated session by providing the following interventions:     ? ?Setting up pt's device and instructing pt's daughter in programming device to convey conversational information - for example, pt wanted to ask how my father-in-law was doing but it required her daughter to guess - questions like that should be programmed into pt's device to increase pt's conversational abilities with others     ? ?Functional language: SLP further facilitated session by instructing pt and her daughter in ways to facilitate language comprehension within tasks such as making a batch of brownies - demonstration provided of how to presentation language to promote increased understanding   ? ?  ?  ? ?  ? ? ? SLP Education - 07/16/21 1153   ? ? Education Details ways to promote functional language comprehension and use   ? Person(s) Educated Patient;Child(ren)   ? Methods Explanation;Demonstration;Verbal cues   ? Comprehension Verbalized understanding;Need further instruction;Returned demonstration   ? ?  ?  ? ?  ? ? ?  SLP Short Term Goals - 06/24/21 1607   ? ?  ? SLP SHORT TERM GOAL #1  ? Title With moderate assistance, pt will use her SGD for one communication exchange with therapist in 8 out of 10 opportunities.   ? Baseline total assistance required   ? Time 10   ? Period --   sessions  ? Status Revised   ?  ? SLP SHORT TERM GOAL #2  ? Title Pt will answer questions about herself with SGD given moderate cues in 8 out of 10 opportunities.   ? Baseline goal discontinued, revised   ? Time 10   ? Period --   sessions  ? Status New   ?  ? SLP SHORT TERM GOAL #3  ? Title With caregiver assistance, add 3 new phrases describing recent events to her SGD each week to supplement expressive skills and increase conversational participation.   ? Baseline new goal   ?  Time 10   ? Period --   sessions  ? Status Revised   ? ?  ?  ? ?  ? ? ? SLP Long Term Goals - 06/24/21 1607   ? ?  ? SLP LONG TERM GOAL #1  ? Title Using an SGD, pt will indicate 1 need in 3 out of 5 opportunities with communication partners.   ? Time 12   ? Period Weeks   ? Status On-going   ? Target Date 09/17/21   ?  ? SLP LONG TERM GOAL #2  ? Title Using an SGD, pt will check-in for each therapy session.   ? Time 12   ? Period Weeks   ? Status On-going   ? Target Date 09/17/21   ? ?  ?  ? ?  ? ? ? Plan - 07/16/21 1155   ? ? Clinical Impression Statement Pt presents with severe apraxia of speech, expressive > receptive aphasia resulting in inability to communicate wants and needs verbally. Pt and her family have responded well to using SGD and it continues to be viable mode of communication for pt. In addition, pt and her daughter were very excited with recommendations to involve pt in tasks such as setting the table to promote language abilities. As such, skilled ST intervention continues to be required to target the above impairments to increase pt's functional ability to communicate her want/s needs as well as participate in leisure and community activities.   ? Speech Therapy Frequency 2x / week   ? Duration 12 weeks   ? Treatment/Interventions SLP instruction and feedback;Patient/family education;Functional tasks;Compensatory techniques;Language facilitation;Environmental controls;Multimodal communcation approach;Compensatory strategies;Internal/external aids;Cueing hierarchy   ? Potential to Achieve Goals Good   ? Potential Considerations Severity of impairments   ? SLP Home Exercise Plan provided, see pt instructions section   ? Consulted and Agree with Plan of Care Patient;Family member/caregiver   ? Family Member Consulted pt's daughter   ? ?  ?  ? ?  ? ? ?Patient will benefit from skilled therapeutic intervention in order to improve the following deficits and impairments:    ?Aphasia ? ?Apraxia ? 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 ?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 [facility]) ? ?Basal ganglia stroke (HCC) ? ? ? ?Problem List ?Patient Active Problem List  ? Diagnosis Date Noted  ? Aphasia, late effect of cerebrovascular disease 03/05/2021  ? Apraxia, late effect of cerebrovascular disease 03/05/2021  ? Sleep disturbance 03/05/2021  ? Acute on chronic renal failure (HCC) 03/05/2021  ?  Slow transit constipation   ? Iron deficiency anemia   ? Benign essential HTN   ? Acute ischemic left middle cerebral artery (MCA) stroke (HCC) 02/21/2021  ? Stroke (cerebrum) (HCC) 02/16/2021  ? Middle cerebral artery embolism, left 02/16/2021  ? Endotracheally intubated   ? Hypertensive urgency 09/29/2020  ? History of ischemic left MCA stroke 09/29/2020  ? Basal ganglia stroke (HCC) 09/29/2020  ? Malignant hypertension 09/29/2020  ? Obesity, Class III, BMI 40-49.9 (morbid obesity) (HCC) 09/29/2020  ? Hyperlipidemia, mixed 09/29/2020  ? ?Kory Panjwani B. Dreama Saa, M.S., CCC-SLP, CBIS ?Speech-Language Pathologist ?Rehabilitation Services ?Office [phone] ? ?Kimberely Mccannon, CCC-SLP ?07/16/2021, 11:56 AM ? ?[place] ?[facility] ?[address] ?[place], [place], [address] ?Phone: [phone]   Fax:  [phone] ? ? ?Name: [name] ?MRN: [id number] ?Date of Birth: [birth year] ? ?

## 2021-07-16 NOTE — Patient Instructions (Signed)
Use language within functional tasks such as setting the table, making batch of brownies ?

## 2021-07-17 ENCOUNTER — Encounter: Payer: Medicaid Other | Admitting: Speech Pathology

## 2021-07-17 ENCOUNTER — Ambulatory Visit: Payer: Medicaid Other | Admitting: Speech Pathology

## 2021-07-17 DIAGNOSIS — R4701 Aphasia: Secondary | ICD-10-CM | POA: Diagnosis not present

## 2021-07-17 DIAGNOSIS — I69390 Apraxia following cerebral infarction: Secondary | ICD-10-CM

## 2021-07-17 DIAGNOSIS — R482 Apraxia: Secondary | ICD-10-CM

## 2021-07-18 NOTE — Therapy (Addendum)
[place] ?[facility] ?[place], [place], [address] ?Phone: [phone]   Fax:  [phone] ? ?Speech Language Pathology Treatment ?PROGRESS NOTE ? ?06/03/2021 to 07/17/2021 ? ?Patient Details  ?Name: [name] ?MRN: [id number] ?Date of Birth: Jul 13, 1975 ?Referring Provider (SLP): Danella Sensing ? ? ?Encounter Date: 07/17/2021 ? ? ?Dates of Reporting Period: 06/03/2021 thru 07/17/2021 ? ?  ?Objective: Patient has been seen for 10 speech therapy sessions this reporting period targeting aphasia (expressive and receptive), apraxia of speech and multimodal communication. Patient is making steady progress towards her goals.  See skilled intervention, clinical impressions, and goals below for details. ? ? ? End of Session - 07/18/21 1459   ? ? Visit Number 20   ? Number of Visits 39   ? Date for SLP Re-Evaluation 09/16/21   ? Authorization Type [place] Medicaid Prepaid Health Plan   ? Authorization Time Period 06/24/2021 thru 09/16/2021   ? Authorization - Visit Number 10   ? Authorization - Number of Visits 12   ? Progress Note Due on Visit 10   ? SLP Start Time 1100   ? SLP Stop Time  1200   ? SLP Time Calculation (min) 60 min   ? Activity Tolerance Patient tolerated treatment well   ? ?  ?  ? ?  ? ? ?Past Medical History:  ?Diagnosis Date  ? Anemia   ? history of goiter   ? Hypertension   ? Low back pain   ? Stroke [facility])   ? ? ?Past Surgical History:  ?Procedure Laterality Date  ? CESAREAN SECTION    ? IR CT HEAD LTD  02/16/2021  ? IR CT HEAD LTD  02/16/2021  ? IR CT HEAD LTD  02/16/2021  ? IR CT HEAD LTD  02/16/2021  ? IR PERCUTANEOUS ART THROMBECTOMY/INFUSION INTRACRANIAL INC DIAG ANGIO  02/16/2021  ? RADIOLOGY WITH ANESTHESIA N/A 02/16/2021  ? Procedure: IR WITH ANESTHESIA;  Surgeon: Luanne Bras, MD;  Location: [place];  Service: Radiology;  Laterality: N/A;  ? THYROIDECTOMY    ? ? ?There were no vitals filed for this visit. ? ? Subjective Assessment -  07/18/21 1458   ? ? Subjective pt pleasant   ? Patient is accompained by: Family member   ? Currently in Pain? No/denies   ? ?  ?  ? ?  ? ? ? ? ? ? ? ? ADULT SLP TREATMENT - 07/18/21 0001   ? ?  ? Treatment Provided  ? Treatment provided Cognitive-Linquistic   ?  ? Cognitive-Linquistic Treatment  ? Treatment focused on Aphasia;Apraxia;Patient/family/caregiver education   ? Skilled Treatment Skilled treatment session focused on pt's language impairments, specifically setting up her SGD, tasks to promote functional language within activities.      ? ?SLP facilitated session by providing the following interventions:     ? ?Setting up pt's device and instructing pt's daughter in programming device to convey conversational information - for example, pt wanted to ask how my father-in-law was doing but it required her daughter to guess - questions like that should be programmed into pt's device to increase pt's conversational abilities with others     ? ?Functional language: SLP further facilitated session by instructing pt and her daughter in ways to facilitate language comprehension within tasks such as making a batch of brownies - demonstration provided of how to presentation language to promote increased understanding   ? ?  ?  ? ?  ? ? ?  SLP Education - 07/18/21 1459   ? ? Education Details provided   ? Person(s) Educated Patient;Child(ren)   ? Methods Explanation;Demonstration;Verbal cues   ? Comprehension Verbalized understanding;Returned demonstration;Need further instruction   ? ?  ?  ? ?  ? ?SLP Short Term Goals - 07/18/21 1500   ?  ?    ?     ?  SLP SHORT TERM GOAL #1  ?  Title With moderate assistance, pt will use her SGD for one communication exchange with therapist in 8 out of 10 opportunities.   ?  Baseline progress made - current level 1 out of 10 opportunities d/t unforeseen social language deficits   ?  Time 10   ?  Period --   sessions  ?  Status On-going   ?     ?  SLP SHORT TERM GOAL #2  ?  Title Pt  will demosntrate improved social language abilities by participating in 2 turn taking communications in 2 out of 10 opportunities.   ?  Baseline goal met, revised to reflect progress   ?  Time 10   ?  Period --   sessions  ?  Status Revised   ?     ?  SLP SHORT TERM GOAL #3  ?  Title With caregiver assistance, add 3 new phrases describing recent events to her SGD each week to supplement expressive skills and increase conversational participation.   ?  Baseline progress made - have started in several sessions - caregiver required additional sessions to gain understanding in rationale for doing this to promote conversation not just target pt's wants/needs   ?  Time 10   ?  Period --   sessions  ?  Status On-going   ?  ?   ?  ?  ? ? SLP Long Term Goals - 07/18/21 1500   ? ?  ? SLP LONG TERM GOAL #1  ? Title Using an SGD, pt will communicate social greeting in 3 out of 5 opportunties   ? Baseline progress made, pt has become effiecent in navigating icons but requires moderate assistance to use icons appropriate to convery wants/needs and conversational exchanges   ? Time 12   ? Period Weeks   ? Status On-going   ? Target Date 09/17/21   ?  ? SLP LONG TERM GOAL #2  ? Title Pt will increase receptive language by listening to novel utterances during communication exchanges and responding appropriately to information.   ? Baseline new goal   ? Time 12   ? Status New   ? Target Date 09/17/21   ? ?  ?  ? ?  ? ? ? ? Plan - 07/18/21 1459   ? ? Clinical Impression Statement Pt presents with severe apraxia of speech, expressive > receptive aphasia resulting in inability to communicate wants and needs verbally. Pt and her family have responded well to using SGD and it continues to be viable mode of communication for pt. In addition, pt and her daughter were very excited with recommendations to involve pt in tasks such as setting the table to promote language abilities. As such, skilled ST intervention continues to be required to  target the above impairments to increase pt's functional ability to communicate her want/s needs as well as participate in leisure and community activities.   ? Speech Therapy Frequency 2x / week   ? Duration 12 weeks   ? Treatment/Interventions SLP instruction and feedback;Patient/family education;Functional tasks;Compensatory techniques;Language  facilitation;Environmental controls;Multimodal communcation approach;Compensatory strategies;Internal/external aids;Cueing hierarchy   ? Potential to Achieve Goals Good   ? Consulted and Agree with Plan of Care Patient;Family member/caregiver   ? Family Member Consulted pt's daughter   ? ?  ?  ? ?  ? ? ?Patient will benefit from skilled therapeutic intervention in order to improve the following deficits and impairments:   ?Aphasia ? ?Apraxia ? 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 ?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 [facility]) ? ? ? ?Problem List ?Patient Active Problem List  ? Diagnosis Date Noted  ? Aphasia, late effect of cerebrovascular disease 03/05/2021  ? Apraxia, late effect of cerebrovascular disease 03/05/2021  ? Sleep disturbance 03/05/2021  ? Acute on chronic renal failure ([place]) 03/05/2021  ? Slow transit constipation   ? Iron deficiency anemia   ? Benign essential HTN   ? Acute ischemic left middle cerebral artery (MCA) stroke ([place]) 02/21/2021  ? Stroke (cerebrum) ([place]) 02/16/2021  ? Middle cerebral artery embolism, left 02/16/2021  ? Endotracheally intubated   ? Hypertensive urgency 09/29/2020  ? History of ischemic left MCA stroke 09/29/2020  ? Basal ganglia stroke ([place]) 09/29/2020  ? Malignant hypertension 09/29/2020  ? Obesity, Class III, BMI 40-49.9 (morbid obesity) ([place]) 09/29/2020  ? Hyperlipidemia, mixed 09/29/2020  ? ?T[place] Graffam B. Rutherford Nail, M.S., CCC-SLP, CBIS ?Speech-Language Pathologist ?Rehabilitation Services ?Office [phone] ? ?[place], CCC-SLP ?07/18/2021, 3:01 PM ? ?[place] ?[facility] ?[place], [place], [address] ?Phone: [phone]   Fax:  [phone] ? ? ?Name: [name] ?MRN: [id number] ?Date of Birth: 30-Mar-1976 ? ?

## 2021-07-22 ENCOUNTER — Ambulatory Visit: Payer: Medicaid Other | Admitting: Speech Pathology

## 2021-07-22 DIAGNOSIS — R482 Apraxia: Secondary | ICD-10-CM

## 2021-07-22 DIAGNOSIS — R4701 Aphasia: Secondary | ICD-10-CM | POA: Diagnosis not present

## 2021-07-22 DIAGNOSIS — I639 Cerebral infarction, unspecified: Secondary | ICD-10-CM

## 2021-07-22 DIAGNOSIS — I69390 Apraxia following cerebral infarction: Secondary | ICD-10-CM

## 2021-07-23 NOTE — Therapy (Signed)
?[facility] ?[place], [place], [address] ?Phone: [phone]   Fax:  [phone] ? ?Speech Language Pathology Treatment ? ?Patient Details  ?Name: [name] ?MRN: [id number] ?Date of Birth: 16-Feb-1976 ?Referring Provider (SLP): Danella Sensing ? ? ?Encounter Date: 07/22/2021 ? ? End of Session - 07/23/21 1158   ? ? Visit Number 21   ? Number of Visits 39   ? Date for SLP Re-Evaluation 09/16/21   ? Authorization Type [place] Medicaid Prepaid Health Plan   ? Authorization Time Period 06/24/2021 thru 09/16/2021   ? Authorization - Visit Number 1   ? Progress Note Due on Visit 10   ? SLP Start Time 1410   ? SLP Stop Time  1500   ? SLP Time Calculation (min) 50 min   ? Activity Tolerance Patient tolerated treatment well   ? ?  ?  ? ?  ? ? ?Past Medical History:  ?Diagnosis Date  ? Anemia   ? history of goiter   ? Hypertension   ? Low back pain   ? Stroke [facility])   ? ? ?Past Surgical History:  ?Procedure Laterality Date  ? CESAREAN SECTION    ? IR CT HEAD LTD  02/16/2021  ? IR CT HEAD LTD  02/16/2021  ? IR CT HEAD LTD  02/16/2021  ? IR CT HEAD LTD  02/16/2021  ? IR PERCUTANEOUS ART THROMBECTOMY/INFUSION INTRACRANIAL INC DIAG ANGIO  02/16/2021  ? RADIOLOGY WITH ANESTHESIA N/A 02/16/2021  ? Procedure: IR WITH ANESTHESIA;  Surgeon: Luanne Bras, MD;  Location: [place];  Service: Radiology;  Laterality: N/A;  ? THYROIDECTOMY    ? ? ?There were no vitals filed for this visit. ? ? Subjective Assessment - 07/23/21 1157   ? ? Subjective pt pleasant, eager, brings SGD   ? Patient is accompained by: Family member   ? Currently in Pain? No/denies   ? ?  ?  ? ?  ? ? ? ? ? ? ? ? ADULT SLP TREATMENT - 07/23/21 0001   ? ?  ? Treatment Provided  ? Treatment provided Cognitive-Linquistic   ?  ? Cognitive-Linquistic Treatment  ? Treatment focused on Aphasia;Apraxia;Patient/family/caregiver education   ? Skilled Treatment Skilled treatment session focused on pt's  language impairments, specifically setting up her SGD, tasks to promote functional language within activities.      ? ?SLP facilitated session by providing the following interventions:  ? ?Multi-modal communication: Pt's daughter had added new icons/messages on SGD to promote conversational exchanges with SLP. Pt's daughter was surprised that tp required assistance to locate these icons as well as pt requiring maximal assistance to understand the messages conveyed in each utterance. Pt attentively selected each icon but it was apparent that pt didn't understand the turn-taking involved with conversational exchanges. However, after several practices, pt began to look up from her device at conversational partner indicating that she understood need to wait for a response.      ? ?Receptive Language Abilities: SLP informally assessed pt's receptive language abilities during the session. PT DEMONSTRATED GREAT RECEPTIVE LANGUAGE GROWTH. Pt was able to listen to object word and select appropriate object from field of 3 with 90% accuracy; select object from field of 3 based on phrase level auditory only function of object with 75% accuracy,  answer basic auditory yes/no questions with 90% accuracy, follow 1 step basic auditory direction with 80% accuracy   ? ?  ?  ? ?  ? ? ?  SLP Education - 07/23/21 1158   ? ? Education Details provided   ? Person(s) Educated Patient;Child(ren)   ? Methods Demonstration;Verbal cues;Handout   ? Comprehension Verbalized understanding;Returned demonstration;Need further instruction   ? ?  ?  ? ?  ? ? ? SLP Short Term Goals - 07/18/21 1500   ? ?  ? SLP SHORT TERM GOAL #1  ? Title With moderate assistance, pt will use her SGD for one communication exchange with therapist in 8 out of 10 opportunities.   ? Baseline progress made - current level 1 out of 10 opportunities d/t unforeseen social language deficits   ? Time 10   ? Period --   sessions  ? Status On-going   ?  ? SLP SHORT TERM GOAL #2  ?  Title Pt will demosntrate improved social language abilities by participating in 2 turn taking communications in 2 out of 10 opportunities.   ? Baseline goal met, revised to reflect progress   ? Time 10   ? Period --   sessions  ? Status Revised   ?  ? SLP SHORT TERM GOAL #3  ? Title With caregiver assistance, add 3 new phrases describing recent events to her SGD each week to supplement expressive skills and increase conversational participation.   ? Baseline progress made - have started in several sessions - caregiver required additional sessions to gain understanding in rationale for doing this to promote conversation not just target pt's wants/needs   ? Time 10   ? Period --   sessions  ? Status On-going   ? ?  ?  ? ?  ? ? ? SLP Long Term Goals - 07/18/21 1500   ? ?  ? SLP LONG TERM GOAL #1  ? Title Using an SGD, pt will communicate social greeting in 3 out of 5 opportunties   ? Baseline progress made, pt has become effiecent in navigating icons but requires moderate assistance to use icons appropriate to convery wants/needs and conversational exchanges   ? Time 12   ? Period Weeks   ? Status On-going   ? Target Date 09/17/21   ?  ? SLP LONG TERM GOAL #2  ? Title Pt will increase receptive language by listening to novel utterances during communication exchanges and responding appropriately to information.   ? Baseline new goal   ? Time 12   ? Status New   ? Target Date 09/17/21   ? ?  ?  ? ?  ? ? ? Plan - 07/23/21 1159   ? ? Clinical Impression Statement Pt has demonstrated significant improvement in her receptive language ability which is the precursor to expressive language. In comparison to her profound deficits, her growth is incredibly significant. She has also eagerly participated in customizing her SGD.     ? ?Her family is an integral part of her progress and have learned about to customize her device. While she demonstrates understanding of the mechanics/features of the device, pt lacks the ability to  use it functionally d/t higher level receptive language deficits as well as social language deficits. Pt is dependent on her family to problem solve or predict what pt is communicating. Skilled ST is required to target conversational turn-taking using the SGD to improve pt's ability to use functionally with multiple communication partners and across community activities.   ? Speech Therapy Frequency 2x / week   ? Duration 12 weeks   ? Treatment/Interventions SLP instruction and feedback;Patient/family education;Functional tasks;Compensatory techniques;Language  facilitation;Environmental controls;Multimodal communcation approach;Compensatory strategies;Internal/external aids;Cueing hierarchy   ? Potential to Achieve Goals Good   ? Consulted and Agree with Plan of Care Patient;Family member/caregiver   ? Family Member Consulted pt's daughter   ? ?  ?  ? ?  ? ? ?Patient will benefit from skilled therapeutic intervention in order to improve the following deficits and impairments:   ?Aphasia ? ?Apraxia ? 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 ?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 [facility]) ? ? ? ?Problem List ?Patient Active Problem List  ? Diagnosis Date Noted  ? Aphasia, late effect of cerebrovascular disease 03/05/2021  ? Apraxia, late effect of cerebrovascular disease 03/05/2021  ? Sleep disturbance 03/05/2021  ? Acute on chronic renal failure ([place]) 03/05/2021  ? Slow transit constipation   ? Iron deficiency anemia   ? Benign essential HTN   ? Acute ischemic left middle cerebral artery (MCA) stroke ([place]) 02/21/2021  ? Stroke (cerebrum) ([place]) 02/16/2021  ? Middle cerebral artery embolism, left 02/16/2021  ? Endotracheally intubated   ? Hypertensive urgency 09/29/2020  ? History of ischemic left MCA stroke 09/29/2020  ? Basal ganglia stroke ([place]) 09/29/2020  ? Malignant hypertension 09/29/2020  ? Obesity, Class III, BMI 40-49.9 (morbid obesity) ([place]) 09/29/2020  ? Hyperlipidemia, mixed 09/29/2020  ? ?Jannelle Notaro B. Rutherford Nail, M.S.,  CCC-SLP, CBIS ?Speech-Language Pathologist ?Rehabilitation Services ?Office [phone] ? ?Obadiah Dennard, CCC-SLP ?07/23/2021, 11:59 AM ? ?[place] ?[place]

## 2021-07-24 ENCOUNTER — Encounter: Payer: Medicaid Other | Admitting: Speech Pathology

## 2021-07-29 ENCOUNTER — Ambulatory Visit: Payer: Medicaid Other | Admitting: Speech Pathology

## 2021-07-29 DIAGNOSIS — R4701 Aphasia: Secondary | ICD-10-CM | POA: Diagnosis not present

## 2021-07-29 DIAGNOSIS — R41841 Cognitive communication deficit: Secondary | ICD-10-CM

## 2021-07-29 NOTE — Patient Instructions (Addendum)
Continue use of SGD at home ?Add "stories" with places gone, activities done, with whom ?ON NEXT VISIT: utilize trained "Rehab" category to greet and self-checkin at rehab lobby on arrival  ?

## 2021-07-29 NOTE — Therapy (Signed)
[facility] ?[place], [place], [address] ?Phone: [phone]   Fax:  [phone] ? ?Speech Language Pathology Treatment ? ?Patient Details  ?Name: Jessi Talbot Grumbling ?MRN: [id number] ?Date of Birth: 1976-03-29 ?Referring Provider (SLP): Danella Sensing ? ? ?Encounter Date: 07/29/2021 ? ? End of Session - 07/29/21 1532   ? ? Visit Number 22   ? Number of Visits 39   ? Date for SLP Re-Evaluation 09/16/21   ? Authorization Type [place] Medicaid Prepaid Health Plan   ? Authorization Time Period 06/24/2021 thru 09/16/2021   ? Authorization - Visit Number 2   ? Authorization - Number of Visits 12   ? Progress Note Due on Visit 10   ? SLP Start Time 1420   ? SLP Stop Time  1505   ? SLP Time Calculation (min) 45 min   ? Activity Tolerance Patient tolerated treatment well   ? ?  ?  ? ?  ? ? ?Past Medical History:  ?Diagnosis Date  ? Anemia   ? history of goiter   ? Hypertension   ? Low back pain   ? Stroke [facility])   ? ? ?Past Surgical History:  ?Procedure Laterality Date  ? CESAREAN SECTION    ? IR CT HEAD LTD  02/16/2021  ? IR CT HEAD LTD  02/16/2021  ? IR CT HEAD LTD  02/16/2021  ? IR CT HEAD LTD  02/16/2021  ? IR PERCUTANEOUS ART THROMBECTOMY/INFUSION INTRACRANIAL INC DIAG ANGIO  02/16/2021  ? RADIOLOGY WITH ANESTHESIA N/A 02/16/2021  ? Procedure: IR WITH ANESTHESIA;  Surgeon: Luanne Bras, MD;  Location: [facility];  Service: Radiology;  Laterality: N/A;  ? THYROIDECTOMY    ? ? ?There were no vitals filed for this visit. ? ? Subjective Assessment - 07/29/21 1513   ? ? Subjective pt pleasant, eager, brings SGD with daughter present   ? Patient is accompained by: Family member   ? Currently in Pain? No/denies   ? ?  ?  ? ?  ? ? ? ? ? ? ? ? ADULT SLP TREATMENT - 07/29/21 0001   ? ?  ? Treatment Provided  ? Treatment provided Cognitive-Linquistic   ?  ? Cognitive-Linquistic Treatment  ? Treatment focused on Aphasia;Apraxia;Patient/family/caregiver education   ?  Skilled Treatment Skilled treatment targeted increased functional language via SGD geared toward patient relevant communication for life participation.  Improved  turn-taking involved with conversational exchanges as patient awaited question/direction prior to selection with 100% accuracy. Patient would benefit from further challenges with increased volume of questions for patient selection. Continued improvement in auditory comprehension as patient identified the appropriate response in field >2 with 70% accuracy x10 opportunities. Patient demonstrated increased difficulty identifying the category when asked for a specific item/need with 42% accuracy x7, improved to 57% accuracy with minimal contextual cues and improved to 100% accuracy with direct categorical selection (go to help). Abstract categories resulted in errors (help, my info) more than concrete categores (eat: for restaurants). Patient demonstrated improved agency with the SGD by self managing "return" to the home screen, daughter reported at home she has been asking for help with this though was 100% acc x8 with direct an indirect prompts to return to the main home screen.   ? ?  ?  ? ?  ? ? ? SLP Education - 07/29/21 1530   ? ? Education Details SGD progress and continued "build" of communication options for increased IND and agency  in conversation   ? Person(s) Educated Patient;Child(ren)   ? Methods Demonstration;Explanation;Verbal cues   ? Comprehension Verbalized understanding;Returned demonstration;Need further instruction   ? ?  ?  ? ?  ? ? ? SLP Short Term Goals - 07/18/21 1500   ? ?  ? SLP SHORT TERM GOAL #1  ? Title With moderate assistance, pt will use her SGD for one communication exchange with therapist in 8 out of 10 opportunities.   ? Baseline progress made - current level 1 out of 10 opportunities d/t unforeseen social language deficits   ? Time 10   ? Period --   sessions  ? Status On-going   ?  ? SLP SHORT TERM GOAL #2  ? Title Pt  will demosntrate improved social language abilities by participating in 2 turn taking communications in 2 out of 10 opportunities.   ? Baseline goal met, revised to reflect progress   ? Time 10   ? Period --   sessions  ? Status Revised   ?  ? SLP SHORT TERM GOAL #3  ? Title With caregiver assistance, add 3 new phrases describing recent events to her SGD each week to supplement expressive skills and increase conversational participation.   ? Baseline progress made - have started in several sessions - caregiver required additional sessions to gain understanding in rationale for doing this to promote conversation not just target pt's wants/needs   ? Time 10   ? Period --   sessions  ? Status On-going   ? ?  ?  ? ?  ? ? ? SLP Long Term Goals - 07/18/21 1500   ? ?  ? SLP LONG TERM GOAL #1  ? Title Using an SGD, pt will communicate social greeting in 3 out of 5 opportunties   ? Baseline progress made, pt has become effiecent in navigating icons but requires moderate assistance to use icons appropriate to convery wants/needs and conversational exchanges   ? Time 12   ? Period Weeks   ? Status On-going   ? Target Date 09/17/21   ?  ? SLP LONG TERM GOAL #2  ? Title Pt will increase receptive language by listening to novel utterances during communication exchanges and responding appropriately to information.   ? Baseline new goal   ? Time 12   ? Status New   ? Target Date 09/17/21   ? ?  ?  ? ?  ? ? ? Plan - 07/29/21 1532   ? ? Clinical Impression Statement Pt has demonstrated continued improvement in her receptive language ability which is the precursor to expressive language. Patient has also eagerly participated in customizing her SGD    Her family is an integral part of her progress and have learned about to customize her device. While she demonstrates understanding of the mechanics/features of the device, pt lacks the ability to use it independently d/t higher level receptive language deficits as well as social language  deficits. Pt is dependent on her family to problem solve or predict what pt is communicating. Skilled ST is required to target conversational turn-taking and multimodal communication using the SGD to improve pt's ability to use functionally with multiple communication partners and across community activities.   ? Speech Therapy Frequency 2x / week   ? Duration 12 weeks   ? Treatment/Interventions SLP instruction and feedback;Patient/family education;Functional tasks;Compensatory techniques;Language facilitation;Environmental controls;Multimodal communcation approach;Compensatory strategies;Internal/external aids;Cueing hierarchy   ? Potential Considerations Severity of impairments   ? SLP Home  Exercise Plan provided, see pt instructions section   ? Consulted and Agree with Plan of Care Patient;Family member/caregiver   ? Family Member Consulted pt's daughter   ? ?  ?  ? ?  ? ? ?Patient will benefit from skilled therapeutic intervention in order to improve the following deficits and impairments:   ?Aphasia ? ?Cognitive communication deficit ? ? ? ?Problem List ?Patient Active Problem List  ? Diagnosis Date Noted  ? Aphasia, late effect of cerebrovascular disease 03/05/2021  ? Apraxia, late effect of cerebrovascular disease 03/05/2021  ? Sleep disturbance 03/05/2021  ? Acute on chronic renal failure ([place]) 03/05/2021  ? Slow transit constipation   ? Iron deficiency anemia   ? Benign essential HTN   ? Acute ischemic left middle cerebral artery (MCA) stroke ([place]) 02/21/2021  ? Stroke (cerebrum) ([place]) 02/16/2021  ? Middle cerebral artery embolism, left 02/16/2021  ? Endotracheally intubated   ? Hypertensive urgency 09/29/2020  ? History of ischemic left MCA stroke 09/29/2020  ? Basal ganglia stroke ([place]) 09/29/2020  ? Malignant hypertension 09/29/2020  ? Obesity, Class III, BMI 40-49.9 (morbid obesity) ([place]) 09/29/2020  ? Hyperlipidemia, mixed 09/29/2020  ? ?T[place] L. Jaiah Weigel, M.A. CCC-SLP ?Adult-based Speech Language  Pathologist ?[facility] ?[phone] ? ? ?Dalbert Batman, CCC-SLP ?07/29/2021, 3:35 PM ? ?[facility] ?1

## 2021-07-31 ENCOUNTER — Encounter: Payer: Medicaid Other | Admitting: Speech Pathology

## 2021-07-31 ENCOUNTER — Ambulatory Visit: Payer: Medicaid Other | Admitting: Speech Pathology

## 2021-07-31 DIAGNOSIS — R4701 Aphasia: Secondary | ICD-10-CM | POA: Diagnosis not present

## 2021-07-31 DIAGNOSIS — R482 Apraxia: Secondary | ICD-10-CM

## 2021-07-31 NOTE — Therapy (Signed)
[facility] ?[place], [place], [address] ?Phone: [phone]   Fax:  [phone] ? ?Speech Language Pathology Treatment ? ?Patient Details  ?Name: [name] ?MRN: [id number] ?Date of Birth: 1975/12/08 ?Referring Provider (SLP): Danella Sensing ? ? ?Encounter Date: 07/31/2021 ? ? End of Session - 07/31/21 1722   ? ? Visit Number 23   ? Number of Visits 39   ? Date for SLP Re-Evaluation 09/16/21   ? Authorization Type [place] Medicaid Prepaid Health Plan   ? Authorization Time Period 06/24/2021 thru 09/16/2021   ? Authorization - Visit Number 3   ? Authorization - Number of Visits 12   ? Progress Note Due on Visit 10   ? Activity Tolerance Patient tolerated treatment well   ? ?  ?  ? ?  ? ? ?Past Medical History:  ?Diagnosis Date  ? Anemia   ? history of goiter   ? Hypertension   ? Low back pain   ? Stroke [facility])   ? ? ?Past Surgical History:  ?Procedure Laterality Date  ? CESAREAN SECTION    ? IR CT HEAD LTD  02/16/2021  ? IR CT HEAD LTD  02/16/2021  ? IR CT HEAD LTD  02/16/2021  ? IR CT HEAD LTD  02/16/2021  ? IR PERCUTANEOUS ART THROMBECTOMY/INFUSION INTRACRANIAL INC DIAG ANGIO  02/16/2021  ? RADIOLOGY WITH ANESTHESIA N/A 02/16/2021  ? Procedure: IR WITH ANESTHESIA;  Surgeon: Luanne Bras, MD;  Location: [facility];  Service: Radiology;  Laterality: N/A;  ? THYROIDECTOMY    ? ? ?There were no vitals filed for this visit. ? ? Subjective Assessment - 07/31/21 1715   ? ? Subjective "Okay!"   ? Patient is accompained by: Family member   daughter  ? Currently in Pain? No/denies   ? ?  ?  ? ?  ? ? ? ? ? ? ? ? ADULT SLP TREATMENT - 07/31/21 1716   ? ?  ? General Information  ? Behavior/Cognition Alert;Cooperative;Pleasant mood   ? Patient Positioning Upright in chair   ?  ? Treatment Provided  ? Treatment provided Cognitive-Linquistic   ?  ? Cognitive-Linquistic Treatment  ? Treatment focused on Aphasia;Apraxia;Patient/family/caregiver education   ?  Skilled Treatment Continued training in conversational turn-taking using speech generating device. SLP set up "meeting someone new" section on pt's device to promote conversational exchanges and allow pt to ask questions. Pt required initial mod cues to pause after selection to await response when introducing self and asking questions of this unfamiliar therapist. Pt benefitted from initial verbal and gestural cues to look up and make eye contact before returning to device. By 3rd repetition of this exchange, pt demonstrated appropriate turn-taking in 11/12 opportunities. Functional auditory comprehension: pt responded to simple questions (eg what vegetables do you like? what do you like to watch on tv?) with occasional mod cues for navigating folders. Role played turn-taking exchange with new items added to [facility] folder for pt to converse with primary therapist upon her return, initial mod cues fading to mod I, 100% accuracy for turn-taking for 6 turn exchange.   ?  ? Assessment / Recommendations / Plan  ? Plan Continue with current plan of care   ?  ? Progression Toward Goals  ? Progression toward goals Progressing toward goals   ? ?  ?  ? ?  ? ? ? SLP Education - 07/31/21 1722   ? ? Education Details make eye  contact and await response   ? Person(s) Educated Patient;Child(ren)   ? Methods Explanation   ? Comprehension Verbalized understanding   ? ?  ?  ? ?  ? ? ? SLP Short Term Goals - 07/18/21 1500   ? ?  ? SLP SHORT TERM GOAL #1  ? Title With moderate assistance, pt will use her SGD for one communication exchange with therapist in 8 out of 10 opportunities.   ? Baseline progress made - current level 1 out of 10 opportunities d/t unforeseen social language deficits   ? Time 10   ? Period --   sessions  ? Status On-going   ?  ? SLP SHORT TERM GOAL #2  ? Title Pt will demosntrate improved social language abilities by participating in 2 turn taking communications in 2 out of 10 opportunities.   ? Baseline  goal met, revised to reflect progress   ? Time 10   ? Period --   sessions  ? Status Revised   ?  ? SLP SHORT TERM GOAL #3  ? Title With caregiver assistance, add 3 new phrases describing recent events to her SGD each week to supplement expressive skills and increase conversational participation.   ? Baseline progress made - have started in several sessions - caregiver required additional sessions to gain understanding in rationale for doing this to promote conversation not just target pt's wants/needs   ? Time 10   ? Period --   sessions  ? Status On-going   ? ?  ?  ? ?  ? ? ? SLP Long Term Goals - 07/18/21 1500   ? ?  ? SLP LONG TERM GOAL #1  ? Title Using an SGD, pt will communicate social greeting in 3 out of 5 opportunties   ? Baseline progress made, pt has become effiecent in navigating icons but requires moderate assistance to use icons appropriate to convery wants/needs and conversational exchanges   ? Time 12   ? Period Weeks   ? Status On-going   ? Target Date 09/17/21   ?  ? SLP LONG TERM GOAL #2  ? Title Pt will increase receptive language by listening to novel utterances during communication exchanges and responding appropriately to information.   ? Baseline new goal   ? Time 12   ? Status New   ? Target Date 09/17/21   ? ?  ?  ? ?  ? ? ? Plan - 07/31/21 1723   ? ? Clinical Impression Statement Pt has demonstrated continued improvement in her receptive language ability which is the precursor to expressive language. Patient has also eagerly participated in customizing her SGD    Her family is an integral part of her progress and have learned about to customize her device. While she demonstrates understanding of the mechanics/features of the device, pt lacks the ability to use it independently d/t higher level receptive language deficits as well as social language deficits. Pt is dependent on her family to problem solve or predict what pt is communicating. Skilled ST is required to target conversational  turn-taking and multimodal communication using the SGD to improve pt's ability to use functionally with multiple communication partners and across community activities.   ? Speech Therapy Frequency 2x / week   ? Duration 12 weeks   ? Treatment/Interventions SLP instruction and feedback;Patient/family education;Functional tasks;Compensatory techniques;Language facilitation;Environmental controls;Multimodal communcation approach;Compensatory strategies;Internal/external aids;Cueing hierarchy   ? Potential Considerations Severity of impairments   ? SLP Home Exercise Plan  provided, see pt instructions section   ? Consulted and Agree with Plan of Care Patient;Family member/caregiver   ? Family Member Consulted pt's daughter   ? ?  ?  ? ?  ? ? ?Patient will benefit from skilled therapeutic intervention in order to improve the following deficits and impairments:   ?Aphasia ? ?Apraxia ? ? ? ?Problem List ?Patient Active Problem List  ? Diagnosis Date Noted  ? Aphasia, late effect of cerebrovascular disease 03/05/2021  ? Apraxia, late effect of cerebrovascular disease 03/05/2021  ? Sleep disturbance 03/05/2021  ? Acute on chronic renal failure ([place]) 03/05/2021  ? Slow transit constipation   ? Iron deficiency anemia   ? Benign essential HTN   ? Acute ischemic left middle cerebral artery (MCA) stroke ([place]) 02/21/2021  ? Stroke (cerebrum) ([place]) 02/16/2021  ? Middle cerebral artery embolism, left 02/16/2021  ? Endotracheally intubated   ? Hypertensive urgency 09/29/2020  ? History of ischemic left MCA stroke 09/29/2020  ? Basal ganglia stroke ([place]) 09/29/2020  ? Malignant hypertension 09/29/2020  ? Obesity, Class III, BMI 40-49.9 (morbid obesity) ([place]) 09/29/2020  ? Hyperlipidemia, mixed 09/29/2020  ? ?Deneise Lever, MS, CCC-SLP ?Speech-Language Pathologist ?[phone] ? ?Aliene Altes, CCC-SLP ?07/31/2021, 5:23 PM ? ?[facility] ?[place], [place],  [address] ?Phone: [phone]   Fax:  [phone] ? ? ?Name: [name] ?MRN: [id number] ?Date of Birth: 10/28/1975 ? ?

## 2021-08-05 ENCOUNTER — Encounter: Payer: Medicaid Other | Admitting: Speech Pathology

## 2021-08-12 ENCOUNTER — Encounter: Payer: Medicaid Other | Admitting: Speech Pathology

## 2021-08-12 ENCOUNTER — Ambulatory Visit: Payer: Medicaid Other | Attending: Registered Nurse | Admitting: Speech Pathology

## 2021-08-12 DIAGNOSIS — I69390 Apraxia following cerebral infarction: Secondary | ICD-10-CM | POA: Insufficient documentation

## 2021-08-12 DIAGNOSIS — I69920 Aphasia following unspecified cerebrovascular disease: Secondary | ICD-10-CM | POA: Insufficient documentation

## 2021-08-12 DIAGNOSIS — R482 Apraxia: Secondary | ICD-10-CM | POA: Insufficient documentation

## 2021-08-12 DIAGNOSIS — R4701 Aphasia: Secondary | ICD-10-CM | POA: Insufficient documentation

## 2021-08-12 DIAGNOSIS — I69990 Apraxia following unspecified cerebrovascular disease: Secondary | ICD-10-CM | POA: Diagnosis present

## 2021-08-12 DIAGNOSIS — I639 Cerebral infarction, unspecified: Secondary | ICD-10-CM | POA: Diagnosis present

## 2021-08-12 NOTE — Patient Instructions (Signed)
We worked on what to do when there is a Marine scientist breakdown."  ? ?We added a new section on the device for this. It's at the very bottom of the home screen.  ? ?Open this section, then open "who" and add some more people you interact with or talk about (think about extended family, friends, people from church, medical providers, etc).  ?Open the "where" sections: try to add some photos of your house (inside, outside, etc). ? ?If you have a communication breakdown, yes and no questions are great. Sometimes, it might help to narrow things down a little first. Open the Communication Breakdown section: see if Shakira can tell you WHO it has to do with, where, or when? This might give you a better idea of what Yes and No questions to ask.  ?

## 2021-08-13 NOTE — Therapy (Signed)
[place] [facility] ?[place], [place], [address] ?Phone: [phone]   Fax:  [phone] ? ?Speech Language Pathology Treatment ? ?Patient Details  ?Name: [name] ?MRN: [id number] ?Date of Birth: 1975-12-30 ?Referring Provider (SLP): Danella Sensing ? ? ?Encounter Date: 08/12/2021 ? ? End of Session - 08/13/21 0846   ? ? Visit Number 24   ? Number of Visits 39   ? Date for SLP Re-Evaluation 09/16/21   ? Authorization Type [place] Medicaid Prepaid Health Plan   ? Authorization Time Period 06/24/2021 thru 09/16/2021   ? Authorization - Visit Number 4   ? Authorization - Number of Visits 12   ? Progress Note Due on Visit 10   ? SLP Start Time 1415   pt 15 min late  ? SLP Stop Time  1500   ? SLP Time Calculation (min) 45 min   ? Activity Tolerance Patient tolerated treatment well   ? ?  ?  ? ?  ? ? ?Past Medical History:  ?Diagnosis Date  ? Anemia   ? history of goiter   ? Hypertension   ? Low back pain   ? Stroke [facility])   ? ? ?Past Surgical History:  ?Procedure Laterality Date  ? CESAREAN SECTION    ? IR CT HEAD LTD  02/16/2021  ? IR CT HEAD LTD  02/16/2021  ? IR CT HEAD LTD  02/16/2021  ? IR CT HEAD LTD  02/16/2021  ? IR PERCUTANEOUS ART THROMBECTOMY/INFUSION INTRACRANIAL INC DIAG ANGIO  02/16/2021  ? RADIOLOGY WITH ANESTHESIA N/A 02/16/2021  ? Procedure: IR WITH ANESTHESIA;  Surgeon: Luanne Bras, MD;  Location: [place];  Service: Radiology;  Laterality: N/A;  ? THYROIDECTOMY    ? ? ?There were no vitals filed for this visit. ? ? Subjective Assessment - 08/13/21 0823   ? ? Subjective Pt and daughter had communication breakdown when sitting down in ST room.   ? Currently in Pain? No/denies   ? ?  ?  ? ?  ? ? ? ? ? ? ? ? ADULT SLP TREATMENT - 08/13/21 0841   ? ?  ? General Information  ? Behavior/Cognition Alert;Cooperative;Pleasant mood   ?  ? Treatment Provided  ? Treatment provided Cognitive-Linquistic   ?  ? Cognitive-Linquistic Treatment  ? Treatment  focused on Aphasia;Apraxia;Patient/family/caregiver education   ? Skilled Treatment Daughter reports primarily asking Y/N questions when breakdowns occur because she sometimes tried to pt's communication device but "What she wants to say isn't on there." Education on strategies to try to establish shared/narrowed topic to help guide Y/N questions. Established "communication breakdowns" folder on pt's home screen to guide daughter in asking Who/When/Where questions. SLP assisted with device programming and educated pt/daughter on additional icons/photos to add at home (see pt instructions). Daughter reports contacting [employer] re: aphasia camp and plans to attend virtual CAT ([place]) meeting this evening.   ?  ? Assessment / Recommendations / Plan  ? Plan Continue with current plan of care   ?  ? Progression Toward Goals  ? Progression toward goals Progressing toward goals   ? ?  ?  ? ?  ? ? ? SLP Education - 08/13/21 0845   ? ? Education Details how to support pt in using device to answer [facility] questions when breakdowns occur   ? Person(s) Educated Patient;Child(ren)   ? Methods Explanation;Demonstration   ? Comprehension Verbalized understanding;Need further instruction   ? ?  ?  ? ?  ? ? ?  SLP Short Term Goals - 07/18/21 1500   ? ?  ? SLP SHORT TERM GOAL #1  ? Title With moderate assistance, pt will use her SGD for one communication exchange with therapist in 8 out of 10 opportunities.   ? Baseline progress made - current level 1 out of 10 opportunities d/t unforeseen social language deficits   ? Time 10   ? Period --   sessions  ? Status On-going   ?  ? SLP SHORT TERM GOAL #2  ? Title Pt will demosntrate improved social language abilities by participating in 2 turn taking communications in 2 out of 10 opportunities.   ? Baseline goal met, revised to reflect progress   ? Time 10   ? Period --   sessions  ? Status Revised   ?  ? SLP SHORT TERM GOAL #3  ? Title With caregiver assistance, add 3 new  phrases describing recent events to her SGD each week to supplement expressive skills and increase conversational participation.   ? Baseline progress made - have started in several sessions - caregiver required additional sessions to gain understanding in rationale for doing this to promote conversation not just target pt's wants/needs   ? Time 10   ? Period --   sessions  ? Status On-going   ? ?  ?  ? ?  ? ? ? SLP Long Term Goals - 07/18/21 1500   ? ?  ? SLP LONG TERM GOAL #1  ? Title Using an SGD, pt will communicate social greeting in 3 out of 5 opportunties   ? Baseline progress made, pt has become effiecent in navigating icons but requires moderate assistance to use icons appropriate to convery wants/needs and conversational exchanges   ? Time 12   ? Period Weeks   ? Status On-going   ? Target Date 09/17/21   ?  ? SLP LONG TERM GOAL #2  ? Title Pt will increase receptive language by listening to novel utterances during communication exchanges and responding appropriately to information.   ? Baseline new goal   ? Time 12   ? Status New   ? Target Date 09/17/21   ? ?  ?  ? ?  ? ? ? Plan - 08/13/21 0846   ? ? Clinical Impression Statement Pt has demonstrated continued improvement in her receptive language ability which is the precursor to expressive language. Patient has also eagerly participated in customizing her SGD    Her family is an integral part of her progress and have learned about to customize her device. Continue to educate and demonstrate how to use pt's device to support pt's receptive language abilities and to guide repair when expressive language breakdowns occur. Skilled ST is required to target conversational turn-taking and multimodal communication using the SGD to improve pt's ability to use functionally with multiple communication partners and across community activities.   ? Speech Therapy Frequency 2x / week   ? Duration 12 weeks   ? Treatment/Interventions SLP instruction and  feedback;Patient/family education;Functional tasks;Compensatory techniques;Language facilitation;Environmental controls;Multimodal communcation approach;Compensatory strategies;Internal/external aids;Cueing hierarchy   ? Potential Considerations Severity of impairments   ? SLP Home Exercise Plan provided, see pt instructions section   ? Consulted and Agree with Plan of Care Patient;Family member/caregiver   ? Family Member Consulted pt's daughter   ? ?  ?  ? ?  ? ? ?Patient will benefit from skilled therapeutic intervention in order to improve the following deficits and impairments:   ?  Apraxia ? ?Aphasia ? ? ? ?Problem List ?Patient Active Problem List  ? Diagnosis Date Noted  ? Aphasia, late effect of cerebrovascular disease 03/05/2021  ? Apraxia, late effect of cerebrovascular disease 03/05/2021  ? Sleep disturbance 03/05/2021  ? Acute on chronic renal failure ([place]) 03/05/2021  ? Slow transit constipation   ? Iron deficiency anemia   ? Benign essential HTN   ? Acute ischemic left middle cerebral artery (MCA) stroke ([place]) 02/21/2021  ? Stroke (cerebrum) ([place]) 02/16/2021  ? Middle cerebral artery embolism, left 02/16/2021  ? Endotracheally intubated   ? Hypertensive urgency 09/29/2020  ? History of ischemic left MCA stroke 09/29/2020  ? Basal ganglia stroke ([place]) 09/29/2020  ? Malignant hypertension 09/29/2020  ? Obesity, Class III, BMI 40-49.9 (morbid obesity) ([place]) 09/29/2020  ? Hyperlipidemia, mixed 09/29/2020  ? ?Deneise Lever, MS, CCC-SLP ?Speech-Language Pathologist ?[phone] ? ?Aliene Altes, CCC-SLP ?08/13/2021, 8:48 AM ? ? ?[facility] ?[place], [place], [address] ?Phone: [phone]   Fax:  [phone] ? ? ?Name: [name] ?MRN: [id number] ?Date of Birth: [birth year] ? ?

## 2021-08-14 ENCOUNTER — Ambulatory Visit: Payer: Medicaid Other | Admitting: Speech Pathology

## 2021-08-14 DIAGNOSIS — R482 Apraxia: Secondary | ICD-10-CM | POA: Diagnosis not present

## 2021-08-14 DIAGNOSIS — I69390 Apraxia following cerebral infarction: Secondary | ICD-10-CM

## 2021-08-14 DIAGNOSIS — R4701 Aphasia: Secondary | ICD-10-CM

## 2021-08-14 DIAGNOSIS — I639 Cerebral infarction, unspecified: Secondary | ICD-10-CM

## 2021-08-15 NOTE — Therapy (Signed)
?[facility] ?[place], [place], [address] ?Phone: [phone]   Fax:  [phone] ? ?Speech Language Pathology Treatment ? ?Patient Details  ?Name: [name] ?MRN: [id number] ?Date of Birth: 02/18/1976 ?Referring Provider (SLP): Danella Sensing ? ? ?Encounter Date: 08/14/2021 ? ? End of Session - 08/15/21 2150   ? ? Visit Number 25   ? Number of Visits 39   ? Date for SLP Re-Evaluation 09/16/21   ? Authorization Type [place] Medicaid Prepaid Health Plan   ? Authorization Time Period 06/24/2021 thru 09/16/2021   ? Authorization - Visit Number 5   ? Authorization - Number of Visits 12   ? Progress Note Due on Visit 10   ? SLP Start Time 1400   ? SLP Stop Time  1500   ? SLP Time Calculation (min) 60 min   ? Activity Tolerance Patient tolerated treatment well   ? ?  ?  ? ?  ? ? ?Past Medical History:  ?Diagnosis Date  ? Anemia   ? history of goiter   ? Hypertension   ? Low back pain   ? Stroke [facility])   ? ? ?Past Surgical History:  ?Procedure Laterality Date  ? CESAREAN SECTION    ? IR CT HEAD LTD  02/16/2021  ? IR CT HEAD LTD  02/16/2021  ? IR CT HEAD LTD  02/16/2021  ? IR CT HEAD LTD  02/16/2021  ? IR PERCUTANEOUS ART THROMBECTOMY/INFUSION INTRACRANIAL INC DIAG ANGIO  02/16/2021  ? RADIOLOGY WITH ANESTHESIA N/A 02/16/2021  ? Procedure: IR WITH ANESTHESIA;  Surgeon: Luanne Bras, MD;  Location: [place];  Service: Radiology;  Laterality: N/A;  ? THYROIDECTOMY    ? ? ?There were no vitals filed for this visit. ? ? Subjective Assessment - 08/15/21 2140   ? ? Subjective Pt expressed condolences about the passing of SLP's father-in-law   ? Patient is accompained by: Family member   ? Currently in Pain? No/denies   ? ?  ?  ? ?  ? ? ? ? ? ? ? ? ADULT SLP TREATMENT - 08/15/21 0001   ? ?  ? Treatment Provided  ? Treatment provided Cognitive-Linquistic   ?  ? Cognitive-Linquistic Treatment  ? Treatment focused on Aphasia;Apraxia;Patient/family/caregiver  education   ? Skilled Treatment Skilled treatment session focused on using SGD to help during moments of communication breakdowns. Pt's daughter had entered additional pictures and information within each section (Who, When, Where). Additional demonstration provided of ways that pt's daughter can also use device for multimodal means to support caregiver's questions.   ? ?  ?  ? ?  ? ? ? SLP Education - 08/15/21 2150   ? ? Education Details ways to continue supporting pt in using device during communication breakdowns   ? Person(s) Educated Patient;Child(ren)   ? Methods Explanation;Demonstration   ? Comprehension Verbalized understanding;Need further instruction   ? ?  ?  ? ?  ? ? ? SLP Short Term Goals - 07/18/21 1500   ? ?  ? SLP SHORT TERM GOAL #1  ? Title With moderate assistance, pt will use her SGD for one communication exchange with therapist in 8 out of 10 opportunities.   ? Baseline progress made - current level 1 out of 10 opportunities d/t unforeseen social language deficits   ? Time 10   ? Period --   sessions  ? Status On-going   ?  ? SLP SHORT TERM  GOAL #2  ? Title Pt will demosntrate improved social language abilities by participating in 2 turn taking communications in 2 out of 10 opportunities.   ? Baseline goal met, revised to reflect progress   ? Time 10   ? Period --   sessions  ? Status Revised   ?  ? SLP SHORT TERM GOAL #3  ? Title With caregiver assistance, add 3 new phrases describing recent events to her SGD each week to supplement expressive skills and increase conversational participation.   ? Baseline progress made - have started in several sessions - caregiver required additional sessions to gain understanding in rationale for doing this to promote conversation not just target pt's wants/needs   ? Time 10   ? Period --   sessions  ? Status On-going   ? ?  ?  ? ?  ? ? ? SLP Long Term Goals - 07/18/21 1500   ? ?  ? SLP LONG TERM GOAL #1  ? Title Using an SGD, pt will communicate social  greeting in 3 out of 5 opportunties   ? Baseline progress made, pt has become effiecent in navigating icons but requires moderate assistance to use icons appropriate to convery wants/needs and conversational exchanges   ? Time 12   ? Period Weeks   ? Status On-going   ? Target Date 09/17/21   ?  ? SLP LONG TERM GOAL #2  ? Title Pt will increase receptive language by listening to novel utterances during communication exchanges and responding appropriately to information.   ? Baseline new goal   ? Time 12   ? Status New   ? Target Date 09/17/21   ? ?  ?  ? ?  ? ? ? Plan - 08/15/21 2151   ? ? Clinical Impression Statement Pt has demonstrated continued improvement in her receptive language ability which is the precursor to expressive language. Patient has also eagerly participated in customizing her SGD    Her family is an integral part of her progress and have learned about to customize her device. Continue to educate and demonstrate how to use pt's device to support pt's receptive language abilities and to guide repair when expressive language breakdowns occur. Skilled ST is required to target conversational turn-taking and multimodal communication using the SGD to improve pt's ability to use functionally with multiple communication partners and across community activities.   ? Speech Therapy Frequency 2x / week   ? Duration 12 weeks   ? Treatment/Interventions SLP instruction and feedback;Patient/family education;Functional tasks;Compensatory techniques;Language facilitation;Environmental controls;Multimodal communcation approach;Compensatory strategies;Internal/external aids;Cueing hierarchy   ? Potential to Achieve Goals Good   ? Potential Considerations Severity of impairments   ? Consulted and Agree with Plan of Care Patient;Family member/caregiver   ? Family Member Consulted pt's daughter   ? ?  ?  ? ?  ? ? ?Patient will benefit from skilled therapeutic intervention in order to improve the following deficits and  impairments:   ?Aphasia ? ?Apraxia ? 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 ?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 [facility]) ? ? ? ?Problem List ?Patient Active Problem List  ? Diagnosis Date Noted  ? Aphasia, late effect of cerebrovascular disease 03/05/2021  ? Apraxia, late effect of cerebrovascular disease 03/05/2021  ? Sleep disturbance 03/05/2021  ? Acute on chronic renal failure ([place]) 03/05/2021  ? Slow transit constipation   ? Iron deficiency anemia   ? Benign essential HTN   ? Acute ischemic left middle cerebral  artery (MCA) stroke ([place]) 02/21/2021  ? Stroke (cerebrum) ([place]) 02/16/2021  ? Middle cerebral artery embolism, left 02/16/2021  ? Endotracheally intubated   ? Hypertensive urgency 09/29/2020  ? History of ischemic left MCA stroke 09/29/2020  ? Basal ganglia stroke ([place]) 09/29/2020  ? Malignant hypertension 09/29/2020  ? Obesity, Class III, BMI 40-49.9 (morbid obesity) ([place]) 09/29/2020  ? Hyperlipidemia, mixed 09/29/2020  ? ?Jessica Seidman B. Rutherford Nail, M.S., CCC-SLP, CBIS ?Speech-Language Pathologist ?Rehabilitation Services ?Office [phone] ? ?[place], [place] ?08/15/2021, 9:52 PM ? ?[place] [facility] ?[place], [place], [address] ?Phone: [phone]   Fax:  [phone] ? ? ?Name: [name] ?MRN: [id number] ?Date of Birth: 27-Sep-1975 ? ?

## 2021-08-19 ENCOUNTER — Ambulatory Visit: Payer: Medicaid Other | Admitting: Speech Pathology

## 2021-08-19 DIAGNOSIS — I69390 Apraxia following cerebral infarction: Secondary | ICD-10-CM

## 2021-08-19 DIAGNOSIS — I639 Cerebral infarction, unspecified: Secondary | ICD-10-CM

## 2021-08-19 DIAGNOSIS — R4701 Aphasia: Secondary | ICD-10-CM

## 2021-08-19 DIAGNOSIS — R482 Apraxia: Secondary | ICD-10-CM

## 2021-08-20 NOTE — Patient Instructions (Signed)
Complete  exercises on TalkPath Therapy App ?

## 2021-08-20 NOTE — Therapy (Signed)
[place] ?[facility] ?[place], [place], [address] ?Phone: [phone]   Fax:  [phone] ? ?Speech Language Pathology Treatment ? ?Patient Details  ?Name: [name] ?MRN: [id number] ?Date of Birth: 1975/09/29 ?Referring Provider (SLP): Danella Sensing ? ? ?Encounter Date: 08/19/2021 ? ? End of Session - 08/20/21 1939   ? ? Visit Number 26   ? Number of Visits 39   ? Date for SLP Re-Evaluation 09/16/21   ? Authorization Type [place] Medicaid Prepaid Health Plan   ? Authorization Time Period 06/24/2021 thru 09/16/2021   ? Authorization - Visit Number 6   ? Authorization - Number of Visits 12   ? Progress Note Due on Visit 10   ? SLP Start Time 1400   ? SLP Stop Time  1500   ? SLP Time Calculation (min) 60 min   ? Activity Tolerance Patient tolerated treatment well   ? ?  ?  ? ?  ? ? ?Past Medical History:  ?Diagnosis Date  ? Anemia   ? history of goiter   ? Hypertension   ? Low back pain   ? Stroke [facility])   ? ? ?Past Surgical History:  ?Procedure Laterality Date  ? CESAREAN SECTION    ? IR CT HEAD LTD  02/16/2021  ? IR CT HEAD LTD  02/16/2021  ? IR CT HEAD LTD  02/16/2021  ? IR CT HEAD LTD  02/16/2021  ? IR PERCUTANEOUS ART THROMBECTOMY/INFUSION INTRACRANIAL INC DIAG ANGIO  02/16/2021  ? RADIOLOGY WITH ANESTHESIA N/A 02/16/2021  ? Procedure: IR WITH ANESTHESIA;  Surgeon: Luanne Bras, MD;  Location: [place];  Service: Radiology;  Laterality: N/A;  ? THYROIDECTOMY    ? ? ?There were no vitals filed for this visit. ? ? Subjective Assessment - 08/20/21 1917   ? ? Subjective pt arrived to session eager and motivated   ? Patient is accompained by: Family member   ? Currently in Pain? No/denies   ? ?  ?  ? ?  ? ? ? ? ? ? ? ? ADULT SLP TREATMENT - 08/20/21 0001   ? ?  ? Treatment Provided  ? Treatment provided Cognitive-Linquistic   ?  ? Cognitive-Linquistic Treatment  ? Treatment focused on Aphasia;Apraxia;Patient/family/caregiver education   ? Skilled Treatment  SLP facilitated session by providing the following interventions:    TalkPath therapy app used to target receptive language and reading comprehension.   Reading Comprehension: Word ID level 1: 90%; level 2: 80%  Matching level 1: 90%; level 2: 70%   ? ?  ?  ? ?  ? ? ? ? ? SLP Short Term Goals - 07/18/21 1500   ? ?  ? SLP SHORT TERM GOAL #1  ? Title With moderate assistance, pt will use her SGD for one communication exchange with therapist in 8 out of 10 opportunities.   ? Baseline progress made - current level 1 out of 10 opportunities d/t unforeseen social language deficits   ? Time 10   ? Period --   sessions  ? Status On-going   ?  ? SLP SHORT TERM GOAL #2  ? Title Pt will demosntrate improved social language abilities by participating in 2 turn taking communications in 2 out of 10 opportunities.   ? Baseline goal met, revised to reflect progress   ? Time 10   ? Period --   sessions  ? Status Revised   ?  ? SLP SHORT  TERM GOAL #3  ? Title With caregiver assistance, add 3 new phrases describing recent events to her SGD each week to supplement expressive skills and increase conversational participation.   ? Baseline progress made - have started in several sessions - caregiver required additional sessions to gain understanding in rationale for doing this to promote conversation not just target pt's wants/needs   ? Time 10   ? Period --   sessions  ? Status On-going   ? ?  ?  ? ?  ? ? ? SLP Long Term Goals - 07/18/21 1500   ? ?  ? SLP LONG TERM GOAL #1  ? Title Using an SGD, pt will communicate social greeting in 3 out of 5 opportunties   ? Baseline progress made, pt has become effiecent in navigating icons but requires moderate assistance to use icons appropriate to convery wants/needs and conversational exchanges   ? Time 12   ? Period Weeks   ? Status On-going   ? Target Date 09/17/21   ?  ? SLP LONG TERM GOAL #2  ? Title Pt will increase receptive language by listening to novel utterances during communication  exchanges and responding appropriately to information.   ? Baseline new goal   ? Time 12   ? Status New   ? Target Date 09/17/21   ? ?  ?  ? ?  ? ? ? Plan - 08/20/21 1940   ? ? Clinical Impression Statement Pt has demonstrated significant improvement in her receptive language ability as well as reading comprehension. Skilled St intervention continues to be required to target pt's moderate language impairment andapraxia of speech.   ? Speech Therapy Frequency 2x / week   ? Duration 12 weeks   ? Treatment/Interventions SLP instruction and feedback;Patient/family education;Functional tasks;Compensatory techniques;Language facilitation;Environmental controls;Multimodal communcation approach;Compensatory strategies;Internal/external aids;Cueing hierarchy   ? Potential to Achieve Goals Good   ? SLP Home Exercise Plan provided, see pt instructions section   ? Consulted and Agree with Plan of Care Patient;Family member/caregiver   ? Family Member Consulted pt's daughter   ? ?  ?  ? ?  ? ? ?Patient will benefit from skilled therapeutic intervention in order to improve the following deficits and impairments:   ?Aphasia ? ?Apraxia ? 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 ?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 [facility]) ? ? ? ?Problem List ?Patient Active Problem List  ? Diagnosis Date Noted  ? Aphasia, late effect of cerebrovascular disease 03/05/2021  ? Apraxia, late effect of cerebrovascular disease 03/05/2021  ? Sleep disturbance 03/05/2021  ? Acute on chronic renal failure ([place]) 03/05/2021  ? Slow transit constipation   ? Iron deficiency anemia   ? Benign essential HTN   ? Acute ischemic left middle cerebral artery (MCA) stroke ([place]) 02/21/2021  ? Stroke (cerebrum) ([place]) 02/16/2021  ? Middle cerebral artery embolism, left 02/16/2021  ? Endotracheally intubated   ? Hypertensive urgency 09/29/2020  ? History of ischemic left MCA stroke 09/29/2020  ? Basal ganglia stroke ([place]) 09/29/2020  ? Malignant hypertension 09/29/2020  ?  Obesity, Class III, BMI 40-49.9 (morbid obesity) ([place]) 09/29/2020  ? Hyperlipidemia, mixed 09/29/2020  ? ?Va Broadwell B. Rutherford Nail, M.S., CCC-SLP, CBIS ?Speech-Language Pathologist ?Rehabilitation Services ?Office [phone] ? ?[place], [place] ?08/20/2021, 7:43 PM ? ?[place] ?[facility] [place] ?[place], [place], [address] ?Phone: [phone]   Fax:  [phone] ? ? ?Name: [name] ?MRN: [id number] ?Date of Birth: 04-02-1976 ? ?

## 2021-08-21 ENCOUNTER — Ambulatory Visit: Payer: Medicaid Other | Admitting: Speech Pathology

## 2021-08-26 ENCOUNTER — Ambulatory Visit: Payer: Medicaid Other | Admitting: Speech Pathology

## 2021-08-26 DIAGNOSIS — R4701 Aphasia: Secondary | ICD-10-CM

## 2021-08-26 DIAGNOSIS — I69390 Apraxia following cerebral infarction: Secondary | ICD-10-CM

## 2021-08-26 DIAGNOSIS — I639 Cerebral infarction, unspecified: Secondary | ICD-10-CM

## 2021-08-26 DIAGNOSIS — R482 Apraxia: Secondary | ICD-10-CM | POA: Diagnosis not present

## 2021-08-26 NOTE — Therapy (Signed)
[place] ?[facility] ?[address] ?[place], [place], [address] ?Phone: [phone]   Fax:  [phone] ? ?Patient Details  ?Name: [name] ?MRN: [id number] ?Date of Birth: Dec 26, 1975 ?Referring Provider:  Jones Bales, NP ? ?Encounter Date: 08/26/2021 ? ?By way of introduction, my name is Rosea Dory Dreama Saa and I have been seeing [name] for Outpatient Speech Therapy. While she is making progress with her receptive language abilities and reading comprehension, her reading comprehension is limited to the single word level d/t visual field deficits. Her family and I would love for her to be formally evaluated by a neuro-opthalmologist. What are your thoughts on referring her to one? Would this be a referral that you could make for her? ? ?Also, her family was curious about a referral to cardiology to go over the results of her loop recorder and for development of a treatment plan. Would you be able to make this referral as well.  ? ?Please let me know if you have any further questions.  ? ?Loel Betancur B. Dreama Saa, M.S., CCC-SLP, CBIS ?Speech-Language Pathologist ?Rehabilitation Services ?Office [phone] ?Direct [phone] ?Personal Cell [phone] ? ?[place] ?[facility] ?[address] ?[place], [place], [address] ?Phone: [phone]   Fax:  [phone] ?

## 2021-08-26 NOTE — Therapy (Signed)
[place] ?[facility] ?[place], [place], [address] ?Phone: [phone]   Fax:  [phone] ? ?Speech Language Pathology Treatment ? ?Patient Details  ?Name: [name] ?MRN: [id number] ?Date of Birth: 18-Jan-1976 ?Referring Provider (SLP): Danella Sensing ? ? ?Encounter Date: 08/26/2021 ? ? End of Session - 08/26/21 1654   ? ? Visit Number 27   ? Number of Visits 39   ? Date for SLP Re-Evaluation 09/16/21   ? Authorization Type [place] Medicaid [place] [employer] [place]   ? Authorization Time Period 06/24/2021 thru 09/16/2021   ? Authorization - Visit Number 7   ? Progress Note Due on Visit 10   ? SLP Start Time 1400   ? SLP Stop Time  1500   ? SLP Time Calculation (min) 60 min   ? Activity Tolerance Patient tolerated treatment well   ? ?  ?  ? ?  ? ? ?Past Medical History:  ?Diagnosis Date  ? Anemia   ? history of goiter   ? Hypertension   ? Low back pain   ? Stroke [facility])   ? ? ?Past Surgical History:  ?Procedure Laterality Date  ? CESAREAN SECTION    ? IR CT HEAD LTD  02/16/2021  ? IR CT HEAD LTD  02/16/2021  ? IR CT HEAD LTD  02/16/2021  ? IR CT HEAD LTD  02/16/2021  ? IR PERCUTANEOUS ART THROMBECTOMY/INFUSION INTRACRANIAL INC DIAG ANGIO  02/16/2021  ? RADIOLOGY WITH ANESTHESIA N/A 02/16/2021  ? Procedure: IR WITH ANESTHESIA;  Surgeon: Luanne Bras, MD;  Location: [place];  Service: Radiology;  Laterality: N/A;  ? THYROIDECTOMY    ? ? ?There were no vitals filed for this visit. ? ? Subjective Assessment - 08/26/21 1653   ? ? Subjective pt arrived to session eager and motivated   ? Patient is accompained by: Family member   ? Currently in Pain? No/denies   ? ?  ?  ? ?  ? ? ? ? ? ? ? ? ADULT SLP TREATMENT - 08/26/21 0001   ? ?  ? Treatment Provided  ? Treatment provided Cognitive-Linquistic   ?  ? Cognitive-Linquistic Treatment  ? Treatment focused on Aphasia;Apraxia;Patient/family/caregiver education   ? Skilled Treatment Skilled  treatment session focused on pt's language impairments, specifically her reading comprehension.      ? ?SLP facilitated session by providing the following interventions:     ? ?TalkPath Therapy App was utilized to target Reading Comprehension:    ?Word ID level 2: 65% improving to 100% when auditory cue   ?Sentence Completion level 1 - 55% improving to 80% with moderate assistance     ? ?Letter identification: pt unable to locate letters on large touchscreen keyboard- uncertain if related to language impairment or visual deficits   ? ?  ?  ? ?  ? ? ? SLP Education - 08/26/21 1654   ? ? Education Details reading comprehension   ? Person(s) Educated Patient;Child(ren)   ? Methods Explanation;Demonstration   ? Comprehension Verbalized understanding;Need further instruction   ? ?  ?  ? ?  ? ? ? SLP Short Term Goals - 07/18/21 1500   ? ?  ? SLP SHORT TERM GOAL #1  ? Title With moderate assistance, pt will use her SGD for one communication exchange with therapist in 8 out of 10 opportunities.   ? Baseline progress made - current level 1 out of 10  opportunities d/t unforeseen social language deficits   ? Time 10   ? Period --   sessions  ? Status On-going   ?  ? SLP SHORT TERM GOAL #2  ? Title Pt will demosntrate improved social language abilities by participating in 2 turn taking communications in 2 out of 10 opportunities.   ? Baseline goal met, revised to reflect progress   ? Time 10   ? Period --   sessions  ? Status Revised   ?  ? SLP SHORT TERM GOAL #3  ? Title With caregiver assistance, add 3 new phrases describing recent events to her SGD each week to supplement expressive skills and increase conversational participation.   ? Baseline progress made - have started in several sessions - caregiver required additional sessions to gain understanding in rationale for doing this to promote conversation not just target pt's wants/needs   ? Time 10   ? Period --   sessions  ? Status On-going   ? ?  ?  ? ?  ? ? ? SLP Long  Term Goals - 07/18/21 1500   ? ?  ? SLP LONG TERM GOAL #1  ? Title Using an SGD, pt will communicate social greeting in 3 out of 5 opportunties   ? Baseline progress made, pt has become effiecent in navigating icons but requires moderate assistance to use icons appropriate to convery wants/needs and conversational exchanges   ? Time 12   ? Period Weeks   ? Status On-going   ? Target Date 09/17/21   ?  ? SLP LONG TERM GOAL #2  ? Title Pt will increase receptive language by listening to novel utterances during communication exchanges and responding appropriately to information.   ? Baseline new goal   ? Time 12   ? Status New   ? Target Date 09/17/21   ? ?  ?  ? ?  ? ? ? Plan - 08/26/21 1655   ? ? Clinical Impression Statement Pt presents with improved receptive language abilities as well as the potential to improve reading comprehension. She continues to struggle with ill-defined visual deficits. In light of her improved receptive language skills, note was routed to pt's neurologist regarding possible referral to neuro-ophthalmology. Skilled ST intervention continues to be required to improve pt's communication abilities.   ? Speech Therapy Frequency 2x / week   ? Duration 12 weeks   ? Treatment/Interventions SLP instruction and feedback;Patient/family education;Functional tasks;Compensatory techniques;Language facilitation;Environmental controls;Multimodal communcation approach;Compensatory strategies;Internal/external aids;Cueing hierarchy   ? Potential to Achieve Goals Good   ? Potential Considerations Severity of impairments   ? Consulted and Agree with Plan of Care Patient;Family member/caregiver   ? Family Member Consulted pt's daughter   ? ?  ?  ? ?  ? ? ?Patient will benefit from skilled therapeutic intervention in order to improve the following deficits and impairments:   ?Aphasia ? ?Apraxia ? 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 ?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 [facility]) ? ? ? ?Problem List ?Patient Active  Problem List  ? Diagnosis Date Noted  ? Aphasia, late effect of cerebrovascular disease 03/05/2021  ? Apraxia, late effect of cerebrovascular disease 03/05/2021  ? Sleep disturbance 03/05/2021  ? Acute on chronic renal failure ([place]) 03/05/2021  ? Slow transit constipation   ? Iron deficiency anemia   ? Benign essential HTN   ? Acute ischemic left middle cerebral artery (MCA) stroke ([place]) 02/21/2021  ? Stroke (cerebrum) ([place]) 02/16/2021  ? Middle  cerebral artery embolism, left 02/16/2021  ? Endotracheally intubated   ? Hypertensive urgency 09/29/2020  ? History of ischemic left MCA stroke 09/29/2020  ? Basal ganglia stroke ([place]) 09/29/2020  ? Malignant hypertension 09/29/2020  ? Obesity, Class III, BMI 40-49.9 (morbid obesity) ([place]) 09/29/2020  ? Hyperlipidemia, mixed 09/29/2020  ? ?Ayza Ripoll B. Rutherford Nail, M.S., CCC-SLP, CBIS ?Speech-Language Pathologist ?Rehabilitation Services ?Office [phone] ? ?[place], CCC-SLP ?08/26/2021, 4:56 PM ? ?[place] ?[facility] ?[place], [place], [address] ?Phone: [phone]   Fax:  [phone] ? ? ?Name: [name] ?MRN: [id number] ?Date of Birth: 1975/04/25 ? ?

## 2021-08-28 ENCOUNTER — Ambulatory Visit: Payer: Medicaid Other | Admitting: Speech Pathology

## 2021-08-28 DIAGNOSIS — R482 Apraxia: Secondary | ICD-10-CM | POA: Diagnosis not present

## 2021-08-28 DIAGNOSIS — I69990 Apraxia following unspecified cerebrovascular disease: Secondary | ICD-10-CM

## 2021-08-28 DIAGNOSIS — I69920 Aphasia following unspecified cerebrovascular disease: Secondary | ICD-10-CM

## 2021-08-28 DIAGNOSIS — R4701 Aphasia: Secondary | ICD-10-CM

## 2021-08-29 NOTE — Therapy (Addendum)
[place] MAIN [facility] SERVICES [address] [place], [place], [address] Phone: [phone]   Fax:  [phone]  Speech Language Pathology Treatment  Patient Details  Name: [name] MRN: [id number] Date of Birth: [birth year] Referring Provider (SLP): Danella Sensing   Encounter Date: 08/28/2021   End of Session - 08/29/21 1551     Visit Number 28    Number of Visits 78    Date for SLP Re-Evaluation 09/16/21    Authorization Type [place] Medicaid [place]    Authorization Time Period 06/24/2021 thru 09/16/2021    Authorization - Visit Number 8    Progress Note Due on Visit 10    SLP Start Time 1400    SLP Stop Time  1500    SLP Time Calculation (min) 60 min             Past Medical History:  Diagnosis Date   Anemia    history of goiter    Hypertension    Low back pain    Stroke [facility])     Past Surgical History:  Procedure Laterality Date   CESAREAN SECTION     IR CT HEAD LTD  02/16/2021   IR CT HEAD LTD  02/16/2021   IR CT HEAD LTD  02/16/2021   IR CT HEAD LTD  02/16/2021   IR PERCUTANEOUS ART THROMBECTOMY/INFUSION INTRACRANIAL INC DIAG ANGIO  02/16/2021   RADIOLOGY WITH ANESTHESIA N/A 02/16/2021   Procedure: IR WITH ANESTHESIA;  Surgeon: Luanne Bras, MD;  Location: [place];  Service: Radiology;  Laterality: N/A;   THYROIDECTOMY      There were no vitals filed for this visit.   Subjective Assessment - 08/29/21 1549     Subjective pt arrived to session eager and motivated    Patient is accompained by: Family member    Currently in Pain? No/denies                   ADULT SLP TREATMENT - 08/29/21 0001       Treatment Provided   Treatment provided Cognitive-Linquistic      Cognitive-Linquistic Treatment   Treatment focused on Apraxia;Patient/family/caregiver education    Skilled Treatment Skilled treatment session focused on pt's apraxia of speech. SLP  faciltiated improved motor patterns by utilizing TalkPath therapy App's articulation videos. Pt with improved imitation x 8 but then found it difficult to break motor patterns from previous words. SLP further facilitated session by providing handout from [place] on Apraxia of Speech.   Rationale for Evaluation and Treatment Rehabilitation              SLP Education - 08/29/21 1551     Education Details apraxia of speech    Person(s) Educated Patient;Child(ren)    Methods Explanation;Demonstration;Verbal cues;Handout    Comprehension Verbalized understanding;Returned demonstration;Need further instruction              SLP Short Term Goals - 07/18/21 1500       SLP SHORT TERM GOAL #1   Title With moderate assistance, pt will use her SGD for one communication exchange with therapist in 8 out of 10 opportunities.    Baseline progress made - current level 1 out of 10 opportunities d/t unforeseen social language deficits    Time 10    Period --   sessions   Status On-going      SLP SHORT TERM GOAL #2   Title Pt will  demosntrate improved social language abilities by participating in 2 turn taking communications in 2 out of 10 opportunities.    Baseline goal met, revised to reflect progress    Time 10    Period --   sessions   Status Revised      SLP SHORT TERM GOAL #3   Title With caregiver assistance, add 3 new phrases describing recent events to her SGD each week to supplement expressive skills and increase conversational participation.    Baseline progress made - have started in several sessions - caregiver required additional sessions to gain understanding in rationale for doing this to promote conversation not just target pt's wants/needs    Time 10    Period --   sessions   Status On-going              SLP Long Term Goals - 07/18/21 1500       SLP LONG TERM GOAL #1   Title Using an SGD, pt will communicate social greeting in 3 out of 5 opportunties     Baseline progress made, pt has become effiecent in navigating icons but requires moderate assistance to use icons appropriate to convery wants/needs and conversational exchanges    Time 12    Period Weeks    Status On-going    Target Date 09/17/21      SLP LONG TERM GOAL #2   Title Pt will increase receptive language by listening to novel utterances during communication exchanges and responding appropriately to information.    Baseline new goal    Time 12    Status New    Target Date 09/17/21              Plan - 08/29/21 1552     Clinical Impression Statement Pt presents with improved receptive language abilities as well as the potential to improve reading comprehension. She continues to struggle with ill-defined visual deficits. In light of her improved receptive language skills, note was routed to pt's neurologist regarding possible referral to neuro-ophthalmology. Skilled ST intervention continues to be required to improve pt's communication abilities.    Speech Therapy Frequency 2x / week    Duration 12 weeks    Treatment/Interventions SLP instruction and feedback;Patient/family education;Functional tasks;Compensatory techniques;Language facilitation;Environmental controls;Multimodal communcation approach;Compensatory strategies;Internal/external aids;Cueing hierarchy    Potential to Achieve Goals Good    Potential Considerations Severity of impairments    Consulted and Agree with Plan of Care Patient;Family member/caregiver    Family Member Consulted pt's daughter             Patient will benefit from skilled therapeutic intervention in order to improve the following deficits and impairments:   Apraxia  Aphasia, late effect of cerebrovascular disease  Apraxia, late effect of cerebrovascular disease  Aphasia    Problem List Patient Active Problem List   Diagnosis Date Noted   Aphasia, late effect of cerebrovascular disease 03/05/2021   Apraxia, late effect of  cerebrovascular disease 03/05/2021   Sleep disturbance 03/05/2021   Acute on chronic renal failure ([place]) 03/05/2021   Slow transit constipation    Iron deficiency anemia    Benign essential HTN    Acute ischemic left middle cerebral artery (MCA) stroke ([place]) 02/21/2021   Stroke (cerebrum) ([place]) 02/16/2021   Middle cerebral artery embolism, left 02/16/2021   Endotracheally intubated    Hypertensive urgency 09/29/2020   History of ischemic left MCA stroke 09/29/2020   Basal ganglia stroke ([place]) 09/29/2020   Malignant hypertension 09/29/2020  Obesity, Class III, BMI 40-49.9 (morbid obesity) ([place]) 09/29/2020   Hyperlipidemia, mixed 09/29/2020   Estevon Fluke B. Rutherford Nail M.S., CCC-SLP, [facility] Speech-Language Pathologist Rehabilitation Services Office [phone], [place] 08/29/2021, 3:52 PM  [place] [facility] SERVICES [address] [place], [place], [address] Phone: [phone]   Fax:  [phone]   Name: Jayra Ivalee Strauser MRN: [id number] Date of Birth: 1976/02/02

## 2021-09-02 ENCOUNTER — Ambulatory Visit: Payer: Medicaid Other | Admitting: Speech Pathology

## 2021-09-02 DIAGNOSIS — R482 Apraxia: Secondary | ICD-10-CM

## 2021-09-02 DIAGNOSIS — I69990 Apraxia following unspecified cerebrovascular disease: Secondary | ICD-10-CM

## 2021-09-03 NOTE — Patient Instructions (Signed)
Continue watching articulation videos

## 2021-09-03 NOTE — Therapy (Signed)
[place] MAIN [facility] SERVICES [address] [place], [place], [address] Phone: [phone]   Fax:  [phone]  Speech Language Pathology Treatment  Patient Details  Name: [name] MRN: [id number] Date of Birth: 1975/07/07 Referring Provider (SLP): Danella Sensing   Encounter Date: 09/02/2021   End of Session - 09/03/21 1726     Visit Number 29    Number of Visits 91    Date for SLP Re-Evaluation 09/16/21    Authorization Type [place] Medicaid [place] of [place]    Authorization Time Period 06/24/2021 thru 09/16/2021    Authorization - Visit Number 9    Authorization - Number of Visits 12    Progress Note Due on Visit 10    SLP Start Time 1400    SLP Stop Time  1500    SLP Time Calculation (min) 60 min    Activity Tolerance Patient tolerated treatment well             Past Medical History:  Diagnosis Date   Anemia    history of goiter    Hypertension    Low back pain    Stroke [facility])     Past Surgical History:  Procedure Laterality Date   CESAREAN SECTION     IR CT HEAD LTD  02/16/2021   IR CT HEAD LTD  02/16/2021   IR CT HEAD LTD  02/16/2021   IR CT HEAD LTD  02/16/2021   IR PERCUTANEOUS ART THROMBECTOMY/INFUSION INTRACRANIAL INC DIAG ANGIO  02/16/2021   RADIOLOGY WITH ANESTHESIA N/A 02/16/2021   Procedure: IR WITH ANESTHESIA;  Surgeon: Luanne Bras, MD;  Location: [place];  Service: Radiology;  Laterality: N/A;   THYROIDECTOMY      There were no vitals filed for this visit.   Subjective Assessment - 09/03/21 1725     Subjective pt arrived to session eager and motivated    Patient is accompained by: Family member    Currently in Pain? No/denies                   ADULT SLP TREATMENT - 09/03/21 0001       Treatment Provided   Treatment provided Cognitive-Linquistic      Cognitive-Linquistic Treatment   Treatment focused on Apraxia;Patient/family/caregiver education     Skilled Treatment Skilled treatment session focused on pt's language impairments, specifically her reading comprehension.       SLP facilitated session by providing the following interventions:      Apraxia of Speech - to improve motor patterns, SLP engaged pt in singing activities (Happy Birthday, ABCs, Leone Payor), rote phrases (days of the week, count), conversational phrases (Hey, how are you, I love you) - despite maximal multimodal assistance, pt was not successful in approximating articulatory motor movements. With maximal multi-modal assistance, pt was able to imitate and "ah" as well as "u" but after several repetitions, pt's unable to cease perseverative motor patterns.   Rationale for Evaluation and Treatment Rehabilitation              SLP Education - 09/03/21 1725     Education Details profound impairment with apraxia of speech    Person(s) Educated Patient;Child(ren)    Methods Explanation;Demonstration;Tactile cues;Verbal cues;Handout    Comprehension Verbalized understanding;Returned demonstration              SLP Short Term Goals - 07/18/21 1500       SLP SHORT TERM GOAL #1  Title With moderate assistance, pt will use her SGD for one communication exchange with therapist in 8 out of 10 opportunities.    Baseline progress made - current level 1 out of 10 opportunities d/t unforeseen social language deficits    Time 10    Period --   sessions   Status On-going      SLP SHORT TERM GOAL #2   Title Pt will demosntrate improved social language abilities by participating in 2 turn taking communications in 2 out of 10 opportunities.    Baseline goal met, revised to reflect progress    Time 10    Period --   sessions   Status Revised      SLP SHORT TERM GOAL #3   Title With caregiver assistance, add 3 new phrases describing recent events to her SGD each week to supplement expressive skills and increase conversational participation.    Baseline progress  made - have started in several sessions - caregiver required additional sessions to gain understanding in rationale for doing this to promote conversation not just target pt's wants/needs    Time 10    Period --   sessions   Status On-going              SLP Long Term Goals - 07/18/21 1500       SLP LONG TERM GOAL #1   Title Using an SGD, pt will communicate social greeting in 3 out of 5 opportunties    Baseline progress made, pt has become effiecent in navigating icons but requires moderate assistance to use icons appropriate to convery wants/needs and conversational exchanges    Time 12    Period Weeks    Status On-going    Target Date 09/17/21      SLP LONG TERM GOAL #2   Title Pt will increase receptive language by listening to novel utterances during communication exchanges and responding appropriately to information.    Baseline new goal    Time 12    Status New    Target Date 09/17/21              Plan - 09/03/21 1726     Clinical Impression Statement Pt presents with profound apraxia of speech. While she is always very eager, imitating even the most basic motor patterns is extremely difficult. Recommend continued ST for brief period to assess any progress over several sessions.    Speech Therapy Frequency 2x / week    Duration 12 weeks    Treatment/Interventions SLP instruction and feedback;Patient/family education;Functional tasks;Compensatory techniques;Language facilitation;Environmental controls;Multimodal communcation approach;Compensatory strategies;Internal/external aids;Cueing hierarchy    Potential to Achieve Goals Good    Potential Considerations Severity of impairments;Financial resources;Family/community support    SLP Home Exercise Plan provided, see pt instructions section    Consulted and Agree with Plan of Care Patient;Family member/caregiver    Family Member Consulted pt's daughter             Patient will benefit from skilled therapeutic  intervention in order to improve the following deficits and impairments:   Apraxia  Apraxia, late effect of cerebrovascular disease    Problem List Patient Active Problem List   Diagnosis Date Noted   Aphasia, late effect of cerebrovascular disease 03/05/2021   Apraxia, late effect of cerebrovascular disease 03/05/2021   Sleep disturbance 03/05/2021   Acute on chronic renal failure ([place]) 03/05/2021   Slow transit constipation    Iron deficiency anemia    Benign essential HTN  Acute ischemic left middle cerebral artery (MCA) stroke ([place]) 02/21/2021   Stroke (cerebrum) ([place]) 02/16/2021   Middle cerebral artery embolism, left 02/16/2021   Endotracheally intubated    Hypertensive urgency 09/29/2020   History of ischemic left MCA stroke 09/29/2020   Basal ganglia stroke ([place]) 09/29/2020   Malignant hypertension 09/29/2020   Obesity, Class III, BMI 40-49.9 (morbid obesity) ([place]) 09/29/2020   Hyperlipidemia, mixed 09/29/2020   Rakwon Letourneau B. Rutherford Nail M.S., CCC-SLP, [place] Pathologist Certified Brain Injury [place]  [facility] [phone] Ascom [phone] Fax [phone]  Clarisa Fling 09/03/2021, 5:30 PM  [place] [address] [place], [place], [address] Phone: [phone]   Fax:  [phone]   Name: [name] MRN: [id number] Date of Birth: 19-Dec-1975

## 2021-09-04 ENCOUNTER — Ambulatory Visit: Payer: Medicaid Other | Admitting: Speech Pathology

## 2021-09-09 ENCOUNTER — Ambulatory Visit: Payer: Medicaid Other | Admitting: Speech Pathology

## 2021-09-09 DIAGNOSIS — R482 Apraxia: Secondary | ICD-10-CM | POA: Diagnosis not present

## 2021-09-09 DIAGNOSIS — R4701 Aphasia: Secondary | ICD-10-CM

## 2021-09-09 DIAGNOSIS — I69990 Apraxia following unspecified cerebrovascular disease: Secondary | ICD-10-CM

## 2021-09-09 DIAGNOSIS — I69920 Aphasia following unspecified cerebrovascular disease: Secondary | ICD-10-CM

## 2021-09-10 NOTE — Patient Instructions (Signed)
Continue practicing oral motor videos

## 2021-09-10 NOTE — Therapy (Signed)
[place] MAIN [facility] SERVICES [address] [place], [place], [address] Phone: [phone]   Fax:  [phone]  Speech Language Pathology Treatment 10th visit Progress Note  Patient Details  Name: [name] MRN: [id number] Date of Birth: 08/20/1975 Referring Provider (SLP): Danella Sensing   Encounter Date: 09/09/2021   End of Session - 09/10/21 2041     Visit Number 30    Number of Visits 16    Date for SLP Re-Evaluation 09/16/21    Authorization Type [place] [employer] [place] of [place]    Authorization Time Period 06/24/2021 thru 09/16/2021    Authorization - Visit Number 10    Authorization - Number of Visits 12    Progress Note Due on Visit 10    SLP Start Time 1400    SLP Stop Time  1500    SLP Time Calculation (min) 60 min    Activity Tolerance Patient tolerated treatment well             Past Medical History:  Diagnosis Date   Anemia    history of goiter    Hypertension    Low back pain    Stroke [facility])     Past Surgical History:  Procedure Laterality Date   CESAREAN SECTION     IR CT HEAD LTD  02/16/2021   IR CT HEAD LTD  02/16/2021   IR CT HEAD LTD  02/16/2021   IR CT HEAD LTD  02/16/2021   IR PERCUTANEOUS ART THROMBECTOMY/INFUSION INTRACRANIAL INC DIAG ANGIO  02/16/2021   RADIOLOGY WITH ANESTHESIA N/A 02/16/2021   Procedure: IR WITH ANESTHESIA;  Surgeon: Luanne Bras, MD;  Location: [place];  Service: Radiology;  Laterality: N/A;   THYROIDECTOMY      There were no vitals filed for this visit.   Subjective Assessment - 09/10/21 2040     Subjective pt arrived to session eager and motivated    Patient is accompained by: Family member                   ADULT SLP TREATMENT - 09/10/21 0001       Treatment Provided   Treatment provided Cognitive-Linquistic      Cognitive-Linquistic Treatment   Treatment focused on Apraxia;Patient/family/caregiver education     Skilled Treatment Skilled treatment session focused on pt's language impairments, specifically her reading comprehension.     SLP facilitated session by providing the following interventions:    Apraxia of Speech - to improve motor patterns, SLP engaged pt in singing activities (Happy Birthday, ABCs, Leone Payor), rote phrases (days of the week, count), conversational phrases (Hey, how are you, I love you) - despite maximal multimodal assistance, pt was not successful in approximating articulatory motor movements. With maximal multi-modal assistance, pt was able to imitate and "ah" as well as "u" but after several repetitions, pt's unable to cease perseverative motor patterns.              SLP Education - 09/10/21 2041     Education Details multiple strokes, prognosis for improved motor speech    Person(s) Educated Patient;Child(ren)    Methods Explanation;Demonstration;Verbal cues    Comprehension Verbalized understanding;Returned demonstration              SLP Short Term Goals - 07/18/21 1500       SLP SHORT TERM GOAL #1   Title With moderate assistance, pt will use her SGD for one communication exchange with  therapist in 8 out of 10 opportunities.    Baseline progress made - current level 1 out of 10 opportunities d/t unforeseen social language deficits    Time 10    Period --   sessions   Status On-going      SLP SHORT TERM GOAL #2   Title Pt will demosntrate improved social language abilities by participating in 2 turn taking communications in 2 out of 10 opportunities.    Baseline goal met, revised to reflect progress    Time 10    Period --   sessions   Status Revised      SLP SHORT TERM GOAL #3   Title With caregiver assistance, add 3 new phrases describing recent events to her SGD each week to supplement expressive skills and increase conversational participation.    Baseline progress made - have started in several sessions - caregiver required additional sessions  to gain understanding in rationale for doing this to promote conversation not just target pt's wants/needs    Time 10    Period --   sessions   Status On-going              SLP Long Term Goals - 07/18/21 1500       SLP LONG TERM GOAL #1   Title Using an SGD, pt will communicate social greeting in 3 out of 5 opportunties    Baseline progress made, pt has become effiecent in navigating icons but requires moderate assistance to use icons appropriate to convery wants/needs and conversational exchanges    Time 12    Period Weeks    Status On-going    Target Date 09/17/21      SLP LONG TERM GOAL #2   Title Pt will increase receptive language by listening to novel utterances during communication exchanges and responding appropriately to information.    Baseline new goal    Time 12    Status New    Target Date 09/17/21              Plan - 09/10/21 2042     Clinical Impression Statement Pt presents with profound apraxia of speech. While she is always very eager, imitating even the most basic motor patterns is extremely difficult. Recommend continued ST for brief period to assess any progress over several sessions.    Speech Therapy Frequency 2x / week    Duration 12 weeks    Treatment/Interventions SLP instruction and feedback;Patient/family education;Functional tasks;Compensatory techniques;Language facilitation;Environmental controls;Multimodal communcation approach;Compensatory strategies;Internal/external aids;Cueing hierarchy    Potential to Achieve Goals Fair    Potential Considerations Severity of impairments;Financial resources;Family/community support    SLP Home Exercise Plan provided, see pt instructions section    Consulted and Agree with Plan of Care Patient;Family member/caregiver    Family Member Consulted pt's daughter             Patient will benefit from skilled therapeutic intervention in order to improve the following deficits and impairments:    Apraxia  Aphasia  Apraxia, late effect of cerebrovascular disease  Aphasia, late effect of cerebrovascular disease    Problem List Patient Active Problem List   Diagnosis Date Noted   Aphasia, late effect of cerebrovascular disease 03/05/2021   Apraxia, late effect of cerebrovascular disease 03/05/2021   Sleep disturbance 03/05/2021   Acute on chronic renal failure ([place]) 03/05/2021   Slow transit constipation    Iron deficiency anemia    Benign essential HTN    Acute ischemic left middle  cerebral artery (MCA) stroke ([place]) 02/21/2021   Stroke (cerebrum) ([place]) 02/16/2021   Middle cerebral artery embolism, left 02/16/2021   Endotracheally intubated    Hypertensive urgency 09/29/2020   History of ischemic left MCA stroke 09/29/2020   Basal ganglia stroke ([place]) 09/29/2020   Malignant hypertension 09/29/2020   Obesity, Class III, BMI 40-49.9 (morbid obesity) ([place]) 09/29/2020   Hyperlipidemia, mixed 09/29/2020    [name], [name] 09/10/2021, 8:43 PM  [place] MAIN [facility] SERVICES [address] [place], [place], [address] Phone: [phone]   Fax:  [phone]   Name: [name] MRN: [id number] Date of Birth: [birth year]

## 2021-09-11 ENCOUNTER — Ambulatory Visit: Payer: Medicaid Other | Attending: Registered Nurse | Admitting: Speech Pathology

## 2021-09-11 DIAGNOSIS — R482 Apraxia: Secondary | ICD-10-CM | POA: Diagnosis present

## 2021-09-11 DIAGNOSIS — R4701 Aphasia: Secondary | ICD-10-CM | POA: Diagnosis present

## 2021-09-11 DIAGNOSIS — I69920 Aphasia following unspecified cerebrovascular disease: Secondary | ICD-10-CM | POA: Insufficient documentation

## 2021-09-11 DIAGNOSIS — I69990 Apraxia following unspecified cerebrovascular disease: Secondary | ICD-10-CM | POA: Diagnosis present

## 2021-09-12 NOTE — Therapy (Signed)
[place] MAIN [facility] SERVICES [address] [place], [place], [address] Phone: [phone]   Fax:  [phone]  Speech Language Pathology Treatment  Patient Details  Name: [name] MRN: [id number] Date of Birth: [birth year] Referring Provider (SLP): Danella Sensing   Encounter Date: 09/11/2021   End of Session - 09/12/21 1302     Visit Number 31    Number of Visits 9    Date for SLP Re-Evaluation 09/16/21    Authorization Type [place] Medicaid [place] of [place]    Authorization Time Period 06/24/2021 thru 09/16/2021    Authorization - Visit Number 1    Progress Note Due on Visit 10    SLP Start Time 1310    SLP Stop Time  1400    SLP Time Calculation (min) 50 min    Activity Tolerance Patient tolerated treatment well             Past Medical History:  Diagnosis Date   Anemia    history of goiter    Hypertension    Low back pain    Stroke [facility])     Past Surgical History:  Procedure Laterality Date   CESAREAN SECTION     IR CT HEAD LTD  02/16/2021   IR CT HEAD LTD  02/16/2021   IR CT HEAD LTD  02/16/2021   IR CT HEAD LTD  02/16/2021   IR PERCUTANEOUS ART THROMBECTOMY/INFUSION INTRACRANIAL INC DIAG ANGIO  02/16/2021   RADIOLOGY WITH ANESTHESIA N/A 02/16/2021   Procedure: IR WITH ANESTHESIA;  Surgeon: Luanne Bras, MD;  Location: [place];  Service: Radiology;  Laterality: N/A;   THYROIDECTOMY      There were no vitals filed for this visit.   Subjective Assessment - 09/12/21 1301     Subjective pt arrived to session eager and motivated    Patient is accompained by: Family member    Currently in Pain? No/denies                   ADULT SLP TREATMENT - 09/12/21 0001       Treatment Provided   Treatment provided Cognitive-Linquistic      Cognitive-Linquistic Treatment   Treatment focused on Apraxia;Patient/family/caregiver education    Skilled Treatment Skilled treatment  session focused on pt's language impairments, specifically her reading comprehension and speech intelligibility.      SLP facilitated session by providing the following interventions:      Introduced THERAPY ACTIVITIES on pt's TouchTalk Device:     Fill in Missing Numbers: moderate assistance to achieve 50% accuracy - likely contributable to difficulty understanding task    Complete Common Phrases: Minimal faded to rare minimal to achieve ~ 80% accuracy              SLP Education - 09/12/21 1302     Education Details continue treatment for 2 additional sessions, progress made towards goals    Person(s) Educated Patient;Child(ren)    Methods Explanation;Demonstration;Verbal cues    Comprehension Verbalized understanding;Returned demonstration              SLP Short Term Goals - 07/18/21 1500       SLP SHORT TERM GOAL #1   Title With moderate assistance, pt will use her SGD for one communication exchange with therapist in 8 out of 10 opportunities.    Baseline progress made - current level 1 out of 10 opportunities d/t unforeseen social language deficits  Time 10    Period --   sessions   Status On-going      SLP SHORT TERM GOAL #2   Title Pt will demosntrate improved social language abilities by participating in 2 turn taking communications in 2 out of 10 opportunities.    Baseline goal met, revised to reflect progress    Time 10    Period --   sessions   Status Revised      SLP SHORT TERM GOAL #3   Title With caregiver assistance, add 3 new phrases describing recent events to her SGD each week to supplement expressive skills and increase conversational participation.    Baseline progress made - have started in several sessions - caregiver required additional sessions to gain understanding in rationale for doing this to promote conversation not just target pt's wants/needs    Time 10    Period --   sessions   Status On-going              SLP Long Term Goals  - 07/18/21 1500       SLP LONG TERM GOAL #1   Title Using an SGD, pt will communicate social greeting in 3 out of 5 opportunties    Baseline progress made, pt has become effiecent in navigating icons but requires moderate assistance to use icons appropriate to convery wants/needs and conversational exchanges    Time 12    Period Weeks    Status On-going    Target Date 09/17/21      SLP LONG TERM GOAL #2   Title Pt will increase receptive language by listening to novel utterances during communication exchanges and responding appropriately to information.    Baseline new goal    Time 68    Status New    Target Date 09/17/21              Plan - 09/12/21 1302     Clinical Impression Statement While pt continues to be very eager and motivated, will treat for an additional 2 sessions to finish any needed education and created HEP, as pt has likely made maximal functional gains thru skilled ST intervention.    Speech Therapy Frequency 2x / week    Duration 12 weeks    Treatment/Interventions SLP instruction and feedback;Patient/family education;Functional tasks;Compensatory techniques;Language facilitation;Environmental controls;Multimodal communcation approach;Compensatory strategies;Internal/external aids;Cueing hierarchy    Potential to Achieve Goals Fair    Potential Considerations Severity of impairments;Financial resources;Family/community support    SLP Home Exercise Plan provided, see pt instructions section    Consulted and Agree with Plan of Care Patient;Family member/caregiver    Family Member Consulted pt's daughter             Patient will benefit from skilled therapeutic intervention in order to improve the following deficits and impairments:   Aphasia  Apraxia  Aphasia, late effect of cerebrovascular disease  Apraxia, late effect of cerebrovascular disease    Problem List Patient Active Problem List   Diagnosis Date Noted   Aphasia, late effect of  cerebrovascular disease 03/05/2021   Apraxia, late effect of cerebrovascular disease 03/05/2021   Sleep disturbance 03/05/2021   Acute on chronic renal failure ([place]) 03/05/2021   Slow transit constipation    Iron deficiency anemia    Benign essential HTN    Acute ischemic left middle cerebral artery (MCA) stroke ([place]) 02/21/2021   Stroke (cerebrum) ([place]) 02/16/2021   Middle cerebral artery embolism, left 02/16/2021   Endotracheally intubated    Hypertensive urgency  09/29/2020   History of ischemic left MCA stroke 09/29/2020   Basal ganglia stroke ([place]) 09/29/2020   Malignant hypertension 09/29/2020   Obesity, Class III, BMI 40-49.9 (morbid obesity) ([place]) 09/29/2020   Hyperlipidemia, mixed 09/29/2020   Nathan Stallworth B. Rutherford Nail M.S., CCC-SLP, [place] Pathologist Certified Brain Injury [place]  [facility] [phone] Ascom [phone] Fax [phone]  Clarisa Fling 09/12/2021, 1:03 PM  [place] MAIN [facility] SERVICES [address] [place], [place], [address] Phone: [phone]   Fax:  [phone]   Name: [name] MRN: [id number] Date of Birth: [birth year]

## 2021-09-12 NOTE — Patient Instructions (Signed)
Use therapy activities on her SGD

## 2021-09-16 ENCOUNTER — Ambulatory Visit: Payer: Medicaid Other | Admitting: Speech Pathology

## 2021-09-16 DIAGNOSIS — I69990 Apraxia following unspecified cerebrovascular disease: Secondary | ICD-10-CM

## 2021-09-16 DIAGNOSIS — R4701 Aphasia: Secondary | ICD-10-CM | POA: Diagnosis not present

## 2021-09-16 DIAGNOSIS — I69920 Aphasia following unspecified cerebrovascular disease: Secondary | ICD-10-CM

## 2021-09-16 DIAGNOSIS — R482 Apraxia: Secondary | ICD-10-CM

## 2021-09-17 NOTE — Therapy (Signed)
[place] MAIN [facility] SERVICES [address] [place], [place], [address] Phone: [phone]   Fax:  [phone]  Speech Language Pathology Treatment DISCHARGE SUMMARY  Patient Details  Name: [name] MRN: [id number] Date of Birth: [birth year] Referring Provider (SLP): Danella Sensing   Encounter Date: 09/16/2021   End of Session - 09/17/21 1841     Visit Number 32    Number of Visits 53    Date for SLP Re-Evaluation 09/16/21    Authorization Type [place] Medicaid [place] of [place]    Authorization Time Period 06/24/2021 thru 09/16/2021    Authorization - Visit Number 2    Authorization - Number of Visits 12    Progress Note Due on Visit 10    SLP Start Time 1300    SLP Stop Time  1400    SLP Time Calculation (min) 60 min    Activity Tolerance Patient tolerated treatment well             Past Medical History:  Diagnosis Date   Anemia    history of goiter    Hypertension    Low back pain    Stroke [facility])     Past Surgical History:  Procedure Laterality Date   CESAREAN SECTION     IR CT HEAD LTD  02/16/2021   IR CT HEAD LTD  02/16/2021   IR CT HEAD LTD  02/16/2021   IR CT HEAD LTD  02/16/2021   IR PERCUTANEOUS ART THROMBECTOMY/INFUSION INTRACRANIAL INC DIAG ANGIO  02/16/2021   RADIOLOGY WITH ANESTHESIA N/A 02/16/2021   Procedure: IR WITH ANESTHESIA;  Surgeon: Luanne Bras, MD;  Location: [place];  Service: Radiology;  Laterality: N/A;   THYROIDECTOMY      There were no vitals filed for this visit.   Subjective Assessment - 09/17/21 1827     Subjective pt arrived to session eager and motivated    Patient is accompained by: Family member    Currently in Pain? No/denies                   ADULT SLP TREATMENT - 09/17/21 0001       Treatment Provided   Treatment provided Cognitive-Linquistic      Cognitive-Linquistic Treatment   Treatment focused on  Apraxia;Patient/family/caregiver education    Skilled Treatment Skilled treatment session focused on instructing pt's daughter in backing up SGD and good return demonstration. SLP also instructed pt and her daughter in multiple strategies to use during communication breakdowns including taking SGD and using as a camera to help pt have phontos to refer too when any questions about past events. Also provided pt with 1.5 x 1.5 pictures o church and therapists to place on pt's calendar to help with knowing when appts will be.              SLP Education - 09/17/21 1840     Education Details strategies to utilize during communication breakdowns    Person(s) Educated Patient;Child(ren)    Methods Explanation;Demonstration;Verbal cues;Handout    Comprehension Verbalized understanding;Returned demonstration              SLP Short Term Goals - 09/17/21 1918       SLP SHORT TERM GOAL #1   Title With moderate assistance, pt will use her SGD for one communication exchange with therapist in 8 out of 10 opportunities.    Status Achieved      SLP SHORT TERM  GOAL #2   Title Pt will demosntrate improved social language abilities by participating in 2 turn taking communications in 2 out of 10 opportunities.    Status Achieved      SLP SHORT TERM GOAL #3   Title With caregiver assistance, add 3 new phrases describing recent events to her SGD each week to supplement expressive skills and increase conversational participation.    Status Achieved              SLP Long Term Goals - 09/17/21 1918       SLP LONG TERM GOAL #1   Title Using an SGD, pt will communicate social greeting in 3 out of 5 opportunties    Status Deferred      SLP LONG TERM GOAL #2   Title Pt will increase receptive language by listening to novel utterances during communication exchanges and responding appropriately to information.    Status Partially Met              Plan - 09/17/21 1913     Clinical  Impression Statement Pt has make tremendous progress throughout the course of skilled ST intervention. She is successfully using her SGD and has improved receptive language abilities as well as basic reading comprehension at the word, phrase and simple sentence level. Her expressive communication is severely limited by her profound apraxia of speech. Unfortunately, her functional motor patterns are not progressing at this time. As such, further skilled ST intervention is not indicated.    Potential Considerations Severity of impairments;Financial resources;Family/community support    SLP Home Exercise Plan provided, see pt instructions section    Consulted and Agree with Plan of Care Patient;Family member/caregiver    Family Member Consulted pt's daughter             Problem List Patient Active Problem List   Diagnosis Date Noted   Aphasia, late effect of cerebrovascular disease 03/05/2021   Apraxia, late effect of cerebrovascular disease 03/05/2021   Sleep disturbance 03/05/2021   Acute on chronic renal failure ([place]) 03/05/2021   Slow transit constipation    Iron deficiency anemia    Benign essential HTN    Acute ischemic left middle cerebral artery (MCA) stroke ([place]) 02/21/2021   Stroke (cerebrum) ([place]) 02/16/2021   Middle cerebral artery embolism, left 02/16/2021   Endotracheally intubated    Hypertensive urgency 09/29/2020   History of ischemic left MCA stroke 09/29/2020   Basal ganglia stroke ([place]) 09/29/2020   Malignant hypertension 09/29/2020   Obesity, Class III, BMI 40-49.9 (morbid obesity) ([place]) 09/29/2020   Hyperlipidemia, mixed 09/29/2020   Annalese Stiner B. Rutherford Nail M.S., CCC-SLP, [place] Pathologist Certified Brain Injury [place]  [facility] [phone] Ascom [phone] Fax [phone]  Stormy Fabian, Layne Benton 09/17/2021, 7:20 PM  [place] MAIN  [facility] SERVICES [address] [place], [place], [address] Phone: [phone]   Fax:  [phone]   Name: [name] MRN: [id number] Date of Birth: 1976/03/01

## 2021-09-18 ENCOUNTER — Ambulatory Visit: Payer: Medicaid Other | Admitting: Speech Pathology

## 2021-09-23 ENCOUNTER — Encounter: Payer: Medicaid Other | Admitting: Speech Pathology

## 2021-09-25 ENCOUNTER — Encounter: Payer: Medicaid Other | Admitting: Speech Pathology

## 2021-09-30 ENCOUNTER — Encounter: Payer: Medicaid Other | Admitting: Speech Pathology

## 2021-10-02 ENCOUNTER — Encounter: Payer: Medicaid Other | Admitting: Speech Pathology

## 2021-10-07 ENCOUNTER — Encounter: Payer: Medicaid Other | Admitting: Speech Pathology

## 2021-10-09 ENCOUNTER — Encounter: Payer: Medicaid Other | Admitting: Speech Pathology

## 2021-10-16 ENCOUNTER — Encounter: Payer: Medicaid Other | Admitting: Speech Pathology

## 2021-10-21 ENCOUNTER — Encounter: Payer: Medicaid Other | Admitting: Speech Pathology

## 2021-10-23 ENCOUNTER — Encounter: Payer: Medicaid Other | Admitting: Speech Pathology

## 2021-10-31 ENCOUNTER — Encounter: Payer: Medicaid Other | Attending: Physical Medicine & Rehabilitation | Admitting: Physical Medicine & Rehabilitation

## 2021-10-31 ENCOUNTER — Encounter: Payer: Self-pay | Admitting: Physical Medicine & Rehabilitation

## 2021-10-31 VITALS — BP 147/84 | HR 71 | Ht 65.0 in | Wt 305.0 lb

## 2021-10-31 DIAGNOSIS — I69920 Aphasia following unspecified cerebrovascular disease: Secondary | ICD-10-CM | POA: Diagnosis not present

## 2021-10-31 DIAGNOSIS — I69990 Apraxia following unspecified cerebrovascular disease: Secondary | ICD-10-CM | POA: Diagnosis not present

## 2021-10-31 NOTE — Patient Instructions (Signed)

## 2021-10-31 NOTE — Progress Notes (Signed)
Subjective:    Patient ID: [name], female    DOB: [birth year], 46 y.o.   MRN: [id number]  HPI  P05/19/1977, 46 y.o.   MRN: [id number] 46 y.o. female female with history of HTN, multiple prior CVA was admitted via [facility] on 11/06 22 with right-sided weakness and inability to speak.  CTA/perfusion head was done showing perfusion mismatch with ischemia in left parietal region and M2 occlusion.  She was transferred to [facility] and underwent cerebral angio with thrombectomy of left M2 reocclusion and 2B revascularization.  MRI brain done showing acute large left MCA and left PCA territory infarct with mild mass-effect and additional multiple small acute infarcts in bilateral cerebral hemispheres suggestive of embolic event.  2D echo showed EF of 60 to 65% with severely dilated LA and mild dilatation of aorta approximately 37 mm.  Dr. Pearlean Brownie felt the stroke was embolic secondary to intracranial left MCA atherosclerosis and recommended DAPT x3 months.  She has had issues with hypokalemia requiring runs of K.  She was tolerating dysphagia 3 diet and verbal output was improving.  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    Admit date: 02/21/2021 Discharge date: 03/05/2021  RIght field cut persists has optho appt Finished SLP Still gets OT Mod I for all self care  Doing HEP and stationary bike   Had a fall in March but none since Recent [facility] Neuro f/u appt reviewed  Pain Inventory Average Pain 0 Pain Right Now 0 My pain is  no pain  LOCATION OF PAIN  no pain  BOWEL Number of stools per week: 10-14 Oral laxative use Yes  Type of laxative miralax Enema or suppository use No  History of colostomy No  Incontinent No   BLADDER Normal In and out cath, frequency . Able to self cath  . Bladder incontinence No  Frequent urination No  Leakage with coughing No  Difficulty starting stream No  Incomplete  bladder emptying No    Mobility walk without assistance ability to climb steps?  yes do you drive?  no  Function not employed: date last employed 02/15/2021  Neuro/Psych numbness  Prior Studies Any changes since last visit?  no  Physicians involved in your care Any changes since last visit?  no   Family History  Problem Relation Age of Onset   Healthy Mother    Social History   Socioeconomic History   Marital status: Divorced    Spouse name: Not on file   Number of children: 3   Years of education: Not on file   Highest education level: Not on file  Occupational History   Not on file  Tobacco Use   Smoking status: Never    Passive exposure: Past   Smokeless tobacco: Never  Vaping Use   Vaping Use: Never used  Substance and Sexual Activity   Alcohol use: Yes    Comment: occassionally   Drug use: Yes    Types: Marijuana    Comment: occassionally   Sexual activity: Not Currently  Other Topics Concern   Not on file  Social History Narrative   Not on file   Social Determinants of Health   Financial Resource Strain: Not on file  Food Insecurity: Not on file  Transportation Needs: Not on file  Physical Activity: Not on file  Stress: Not on file  Social Connections: Not on file   Past Surgical History:  Procedure Laterality Date  CESAREAN SECTION     IR CT HEAD LTD  02/16/2021   IR CT HEAD LTD  02/16/2021   IR CT HEAD LTD  02/16/2021   IR CT HEAD LTD  02/16/2021   IR PERCUTANEOUS ART THROMBECTOMY/INFUSION INTRACRANIAL INC DIAG ANGIO  02/16/2021   RADIOLOGY WITH ANESTHESIA N/A 02/16/2021   Procedure: IR WITH ANESTHESIA;  Surgeon: Julieanne Cotton, MD;  Location: [facility] OR;  Service: Radiology;  Laterality: N/A;   THYROIDECTOMY     Past Medical History:  Diagnosis Date   Anemia    history of goiter    Hypertension    Low back pain    Stroke (HCC)    BP (!) 147/84   Pulse 71   Ht 5\' 5"  (1.651 m)   Wt (!) 305 lb (138.3 kg)   SpO2 99%   BMI 50.75  kg/m   Opioid Risk Score:   Fall Risk Score:  `1  Depression screen PHQ 2/9     05/02/2021   10:00 AM 03/27/2021    2:26 PM  Depression screen PHQ 2/9  Decreased Interest 0 0  Down, Depressed, Hopeless 0 0  PHQ - 2 Score 0 0  Altered sleeping  0  Tired, decreased energy  0  Change in appetite  0  Feeling bad or failure about yourself   0  Suicidal thoughts  0  PHQ-9 Score  0     Review of Systems     Objective:   Physical Exam Vitals and nursing note reviewed.  Constitutional:      Appearance: She is obese.  HENT:     Head: Normocephalic and atraumatic.  Eyes:     Extraocular Movements: Extraocular movements intact.     Conjunctiva/sclera: Conjunctivae normal.     Pupils: Pupils are equal, round, and reactive to light.  Neurological:     Mental Status: She is alert and oriented to person, place, and time.     Gait: Gait normal.     Comments: 5/5 strength BUE and BLE  MMT difficult due to apraxia, needs maximum gestural cues repeated   Psychiatric:        Mood and Affect: Mood normal.        Behavior: Behavior normal.           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

## 2022-07-21 IMAGING — MR MR HEAD W/O CM
6 of 11 series · 24 of 48 positions shown · non-contrast
Comparison: Head CT February 17, 2021.

CLINICAL DATA: Stroke, follow-up.

EXAM:
MRI HEAD WITHOUT CONTRAST
TECHNIQUE: Multiplanar, multiecho pulse sequences of the brain and surrounding
structures were obtained without intravenous contrast.

[Series 2: DWI · axial · 3.0mm · 0.94mm/px · z∈[-122,+22]mm · 7 of 98 slices shown (1 of 2)]
[im 1/98]
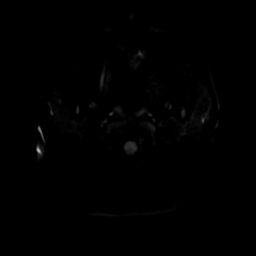
[im 17/98]
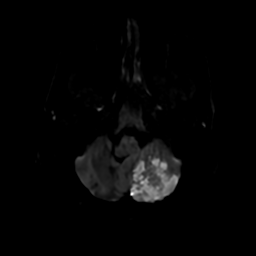
[im 33/98]
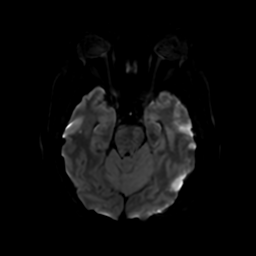
[im 49/98]
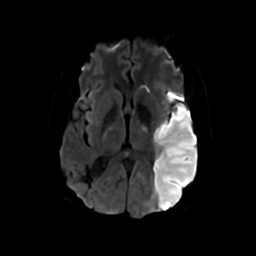
[im 65/98]
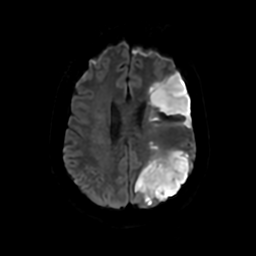
[im 81/98]
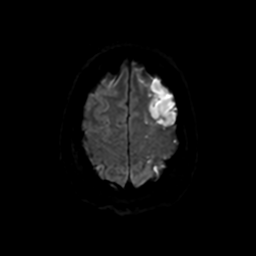
[im 98/98]
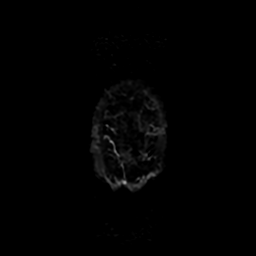

[Series 3: DWI · coronal · 4.0mm · 0.94mm/px · 5 of 72 slices shown (2 of 2)]
[im 1/72]
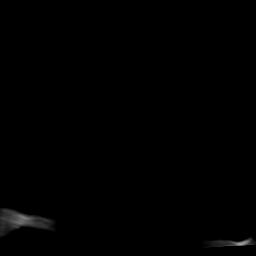
[im 18/72]
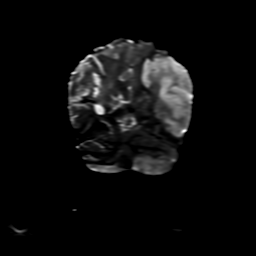
[im 36/72]
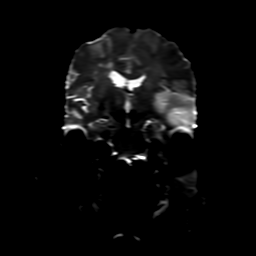
[im 54/72]
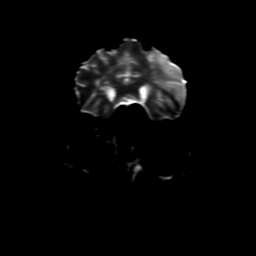
[im 72/72]
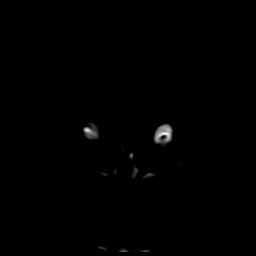

[Series 4: FLAIR · sagittal · 5.0mm · 0.23mm/px · 2 of 23 slices shown (1 of 2)]
[im 1/23]
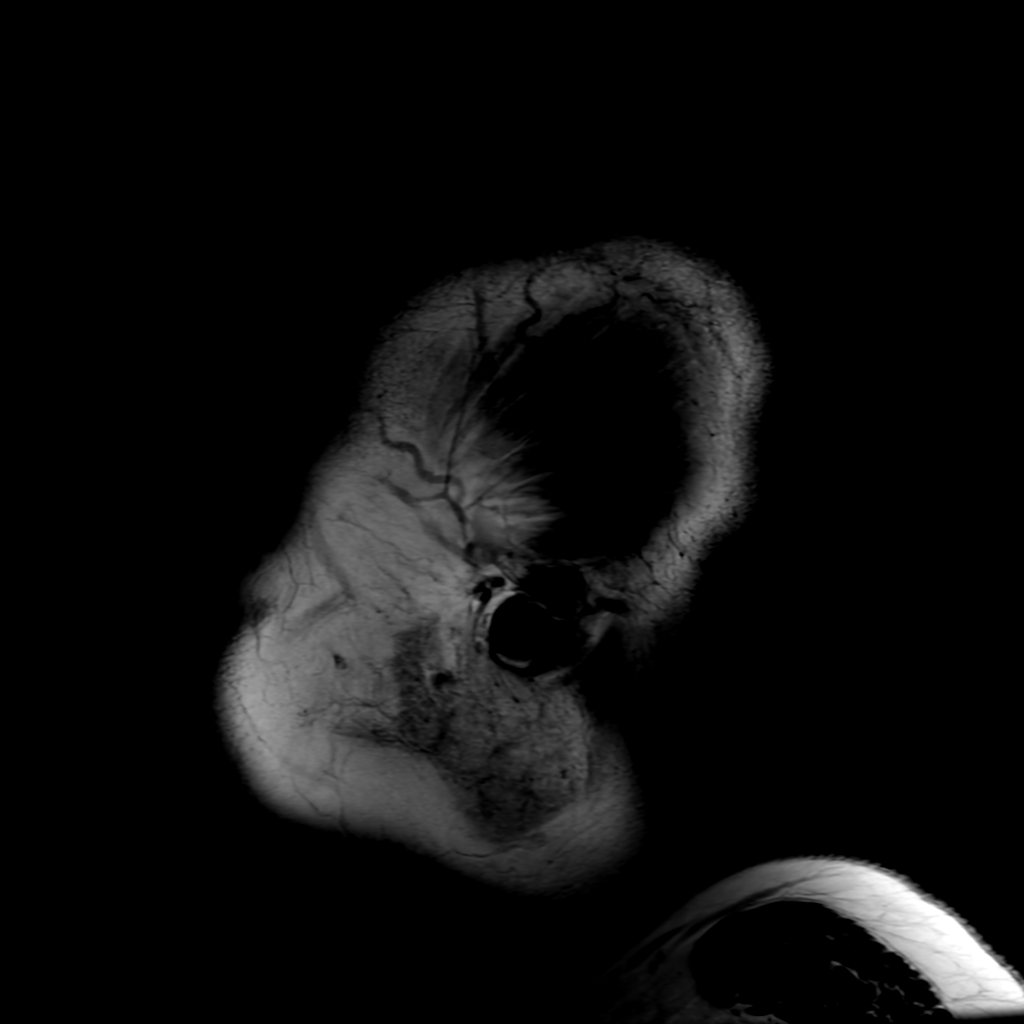
[im 23/23]
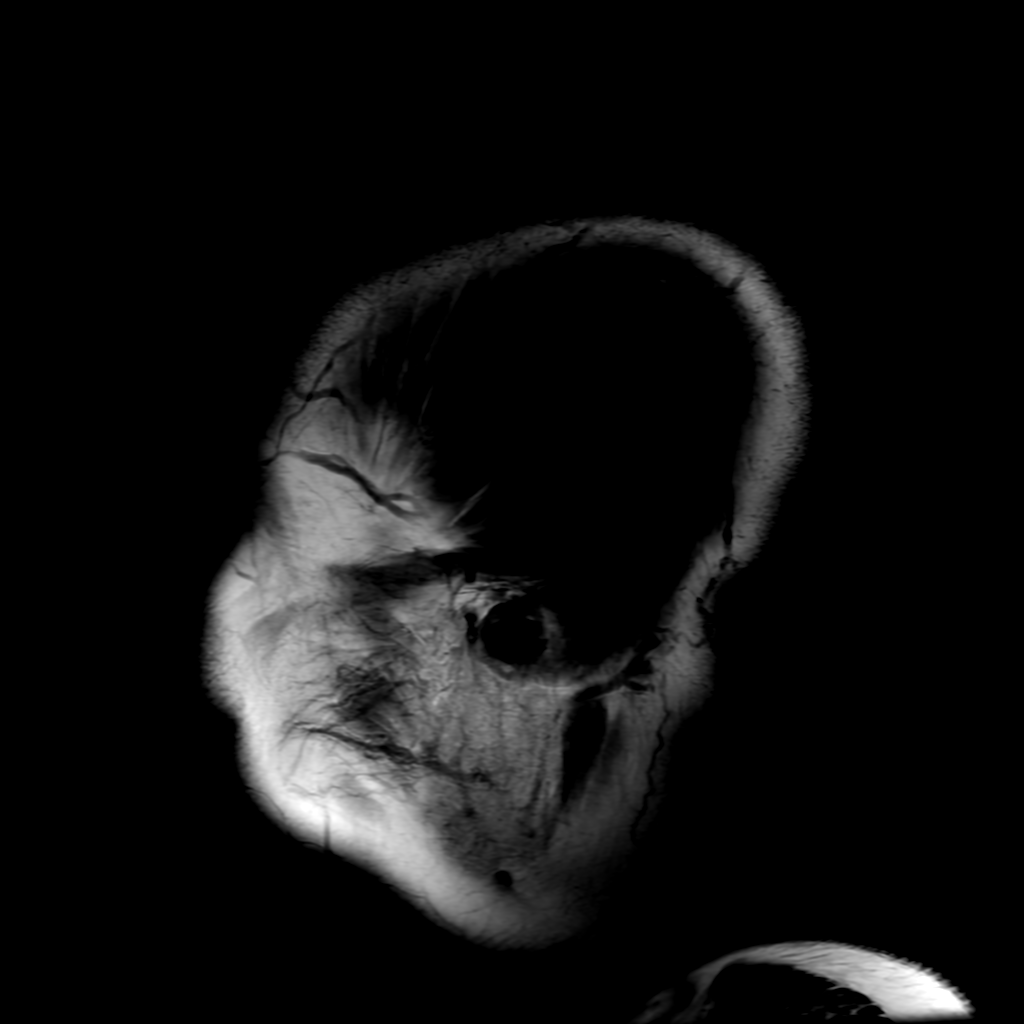

[Series 6: FLAIR · axial · 4.0mm · 0.45mm/px · z∈[-118,+27]mm · 3 of 34 slices shown (2 of 2)]
[im 1/34]
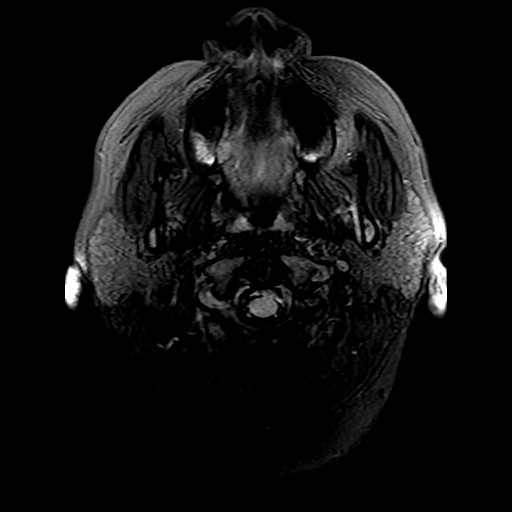
[im 17/34]
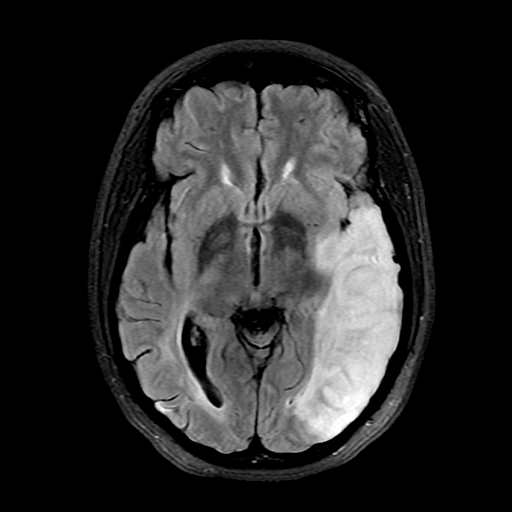
[im 34/34]
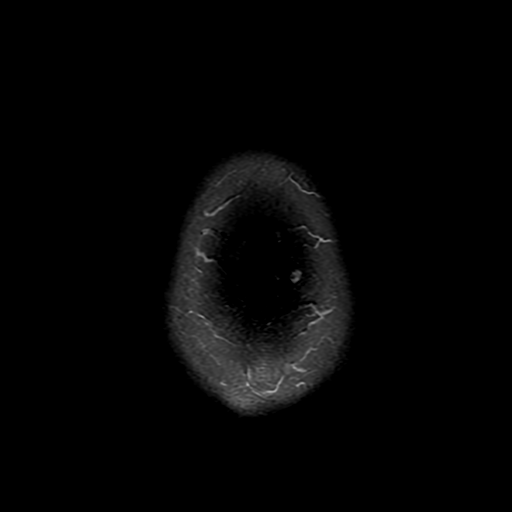

[Series 250: ADC · axial · 3.0mm · 0.94mm/px · z∈[-122,+22]mm · 4 of 49 slices shown (1 of 2)]
[im 1/49]
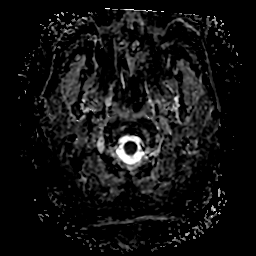
[im 17/49]
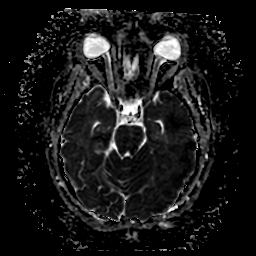
[im 33/49]
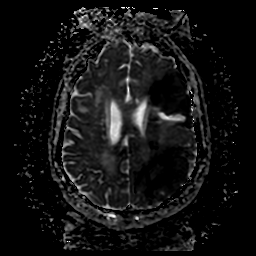
[im 49/49]
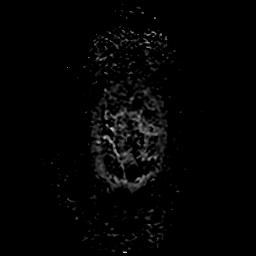

[Series 350: ADC · coronal · 4.0mm · 0.94mm/px · 3 of 35 slices shown (2 of 2)]
[im 1/35]
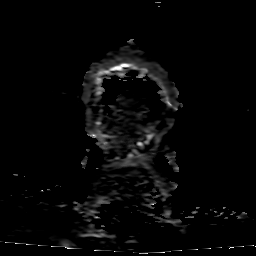
[im 18/35]
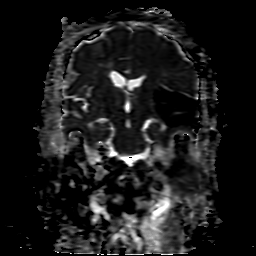
[im 35/35]
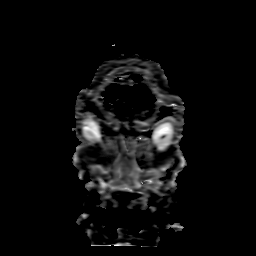

[24 of 48 positions shown; findings below may reference images not displayed]

FINDINGS: Brain: Large areas of restricted diffusion involving the left
frontotemporal parietal and insular region, lateral aspect of the
left occipital lobe and left cerebellar hemisphere. Multiple small
foci of restricted diffusion are also seen scattered through the
bilateral cerebral hemispheres. There is mild mass effect on the
fourth ventricle without hydrocephalus. A 3 mm rightward midline
shift is also seen supratentorially.

Area of encephalomalacia and gliosis are seen in the left frontal
lobe, right temporoparietal region and right cerebellar hemisphere.
Remote lacunar infarcts are also seen the bilateral basal ganglia.
Scattered and confluent foci of T2 hyperintensity are seen within
the white matter of the cerebral hemispheres, nonspecific.

Vascular: Susceptibility artifact noted along the left MCA M2 and M3
branches, may represent intraluminal thrombus.

Skull and upper cervical spine: Diffuse decrease of the T1 signal
within the visualized spine, clivus and calvarium, may represent red
marrow reconversion.

Sinuses/Orbits: Mucosal thickening of the ethmoid cells and sphenoid
sinuses with fluid level within the right sphenoid sinus.

Other: None.
IMPRESSION: 1. Acute large left MCA and left PICA territory infarcts with mild
mass effect. No evidence of hemorrhagic transformation.
2. Additional multiple small acute infarcts in the bilateral
cerebral hemispheres, suggestive of embolic events.
3. Advanced chronic white matter disease. Given recent angiogram
findings, this may be related to severe intracranial atherosclerotic
disease versus vasculitis.

## 2023-05-31 ENCOUNTER — Ambulatory Visit
Admission: EM | Admit: 2023-05-31 | Discharge: 2023-05-31 | Disposition: A | Discharge status: Home / Self Care | Payer: Medicaid Other

## 2023-05-31 ENCOUNTER — Encounter: Payer: Self-pay | Admitting: Emergency Medicine

## 2023-05-31 DIAGNOSIS — H5789 Other specified disorders of eye and adnexa: Secondary | ICD-10-CM | POA: Diagnosis not present

## 2023-05-31 MED ORDER — MOXIFLOXACIN HCL 0.5 % OP SOLN: 1.0000 [drp] | Freq: Three times a day (TID) | OPHTHALMIC | 0 refills | Status: AC

## 2023-05-31 MED ORDER — OPCON-A 0.027-0.315 % OP SOLN: 2.0000 [drp] | Freq: Four times a day (QID) | OPHTHALMIC | 0 refills | Status: AC | PRN

## 2023-05-31 NOTE — Discharge Instructions (Addendum)
 As we discussed, it is possible that the eye redness you are experiencing is a result of allergies given that there is some associated redness but no drainage.  However, we are going to treat you for potentially infectious source.  Instill 1 drop of Vigamox in your right eye eye every 8 hours for the next 7 days for treatment of your conjunctivitis.  I also want you to use Opcon-A and instill 2 drops in your right eye every 6 hours as needed for redness and itching.  Make sure there is 10 minutes between using the Vigamox and this antihistamine eyedrop.  Avoid touching your eyes as much as possible.  Wipe down all surfaces, countertops, and doorknobs after the first and second 24 hours on eyedrops.  Wash her face with a clean wash rag to remove any drainage and use a different portion of the wash rag to clean each eye so as to not reinfect yourself.  Return for reevaluation for any new or worsening symptoms.

## 2023-05-31 NOTE — ED Provider Notes (Signed)
 [facility]    CSN: [id number] Arrival date & time: 05/31/23  9562      History   Chief Complaint Chief Complaint  Patient presents with   eye redness    HPI [name] is a 47 y.o. female.   HPI  48 year old female with past medical history significant for cerebral CVA, malignant hypertension, hyperlipidemia, basal ganglia CVA, left MCA CVA, and iron deficiency anemia presents for evaluation of 3 days with the redness to the right eye with associated eye itching.  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  She denies any discharge or matting.  Past Medical History:  Diagnosis Date   Anemia    history of goiter    Hypertension    Low back pain    Stroke [facility])     Patient Active Problem List   Diagnosis Date Noted   Aphasia, late effect of cerebrovascular disease 03/05/2021   Apraxia, late effect of cerebrovascular disease 03/05/2021   Sleep disturbance 03/05/2021   Acute on chronic renal failure (HCC) 03/05/2021   Slow transit constipation    Iron deficiency anemia    Benign essential HTN    Acute ischemic left middle cerebral artery (MCA) stroke (HCC) 02/21/2021   Stroke (cerebrum) (HCC) 02/16/2021   Middle cerebral artery embolism, left 02/16/2021   Endotracheally intubated    Hypertensive urgency 09/29/2020   History of ischemic left MCA stroke 09/29/2020   Basal ganglia stroke (HCC) 09/29/2020   Malignant hypertension 09/29/2020   Obesity, Class III, BMI 40-49.9 (morbid obesity) (HCC) 09/29/2020   Hyperlipidemia, mixed 09/29/2020    Past Surgical History:  Procedure Laterality Date   CESAREAN SECTION     IR CT HEAD LTD  02/16/2021   IR CT HEAD LTD  02/16/2021   IR CT HEAD LTD  02/16/2021   IR CT HEAD LTD  02/16/2021   IR PERCUTANEOUS ART THROMBECTOMY/INFUSION INTRACRANIAL INC DIAG ANGIO  02/16/2021   RADIOLOGY WITH ANESTHESIA N/A 02/16/2021   Procedure: IR WITH ANESTHESIA;  Surgeon: Julieanne Cotton, MD;  Location: [facility] OR;   Service: Radiology;  Laterality: N/A;   THYROIDECTOMY      OB History   No obstetric history on file.      Home Medications    Prior to Admission medications   Medication Sig Start Date End Date Taking? Authorizing Provider  ezetimibe (ZETIA) 10 MG tablet Take 1 tablet by mouth daily. 06/19/22 06/19/23 Yes [provider]  furosemide (LASIX) 20 MG tablet Take by mouth. 12/30/21  Yes [provider]  moxifloxacin (VIGAMOX) 0.5 % ophthalmic solution Place 1 drop into the right eye 3 (three) times daily for 7 days. 05/31/23 06/07/23 Yes Becky Augusta, NP  Naphazoline-Pheniramine (OPCON-A) 0.027-0.315 % SOLN Apply 2 drops to eye every 6 (six) hours as needed. 05/31/23  Yes Becky Augusta, NP  acetaminophen (TYLENOL) 325 MG tablet Take 1-2 tablets (325-650 mg total) by mouth every 4 (four) hours as needed for mild pain. 03/05/21   Love, Evlyn Kanner, PA-C  amLODipine (NORVASC) 10 MG tablet Take 1 tablet (10 mg total) by mouth daily. 03/05/21   Love, Evlyn Kanner, PA-C  carvedilol (COREG) 12.5 MG tablet Take by mouth. 09/12/21 09/12/22  [provider]  clopidogrel (PLAVIX) 75 MG tablet Take 1 tablet (75 mg total) by mouth daily. 03/05/21   Love, Evlyn Kanner, PA-C  ferrous gluconate (FERGON) 324 MG tablet Take 1 tablet (324 mg total) by mouth 3 (three) times daily with meals.  03/05/21   Love, Evlyn Kanner, PA-C  lamoTRIgine (LAMICTAL) 100 MG tablet Take 100 mg by mouth daily.    [provider]  levETIRAcetam (KEPPRA) 750 MG tablet Take 750 mg by mouth 2 (two) times daily. 08/30/21   [provider]  lisinopril (ZESTRIL) 20 MG tablet Take 20 mg by mouth daily. 05/01/21   [provider]  losartan-hydrochlorothiazide (HYZAAR) 100-12.5 MG tablet Take 1 tablet by mouth daily.    [provider]  melatonin 3 MG TABS tablet Take 1 tablet (3 mg total) by mouth at bedtime. 03/05/21   Love, Evlyn Kanner, PA-C  metoprolol tartrate (LOPRESSOR) 50 MG tablet Take 50 mg by mouth  2 (two) times daily.    [provider]  pantoprazole (PROTONIX) 40 MG tablet Take by mouth. 03/05/21   [provider]  polyethylene glycol (MIRALAX / GLYCOLAX) 17 g packet Take 17 g by mouth 2 (two) times daily. 03/05/21   Love, Evlyn Kanner, PA-C  QUEtiapine (SEROQUEL) 25 MG tablet Take 1 tablet (25 mg total) by mouth at bedtime. 03/05/21   Love, Evlyn Kanner, PA-C  rosuvastatin (CRESTOR) 40 MG tablet Take by mouth. 09/12/21 09/12/22  [provider]    Family History Family History  Problem Relation Age of Onset   Healthy Mother     Social History Social History   Tobacco Use   Smoking status: Never    Passive exposure: Past   Smokeless tobacco: Never  Vaping Use   Vaping status: Never Used  Substance Use Topics   Alcohol use: Yes    Comment: occassionally   Drug use: Yes    Types: Marijuana    Comment: occassionally     Allergies   Labetalol hcl   Review of Systems Review of Systems  Constitutional:  Negative for fever.  Eyes:  Positive for redness, itching and visual disturbance. Negative for pain and discharge.     Physical Exam Triage Vital Signs ED Triage Vitals  Encounter Vitals Group     BP      Systolic BP Percentile      Diastolic BP Percentile      Pulse      Resp      Temp      Temp src      SpO2      Weight      Height      Head Circumference      Peak Flow      Pain Score      Pain Loc      Pain Education      Exclude from Growth Chart    No data found.  Updated Vital Signs BP 118/69 (BP Location: Left Arm)   Pulse 71   Temp 98.3 F (36.8 C) (Oral)   Resp 18   LMP 05/31/2023   SpO2 100%   Visual Acuity Right Eye Distance:  (unable to complete) Left Eye Distance:  (unable to complete) Bilateral Distance:  (unable to complete)  Right Eye Near:   Left Eye Near:    Bilateral Near:     Physical Exam Vitals and nursing note reviewed.  Constitutional:      Appearance: Normal appearance.  Eyes:      General: No scleral icterus.       Right eye: No discharge.     Pupils: Pupils are equal, round, and reactive to light.     Comments: Pupils equal round and reactive with normal red light reflex in  the right eye.  The medial hemisphere of the right eye is erythematous but the lateral hemisphere is unremarkable.  Labral conjunctiva is unremarkable.  Neurological:     Mental Status: She is alert.      UC Treatments / Results  Labs (all labs ordered are listed, but only abnormal results are displayed) Labs Reviewed - No data to display  EKG   Radiology No results found.  Procedures Procedures (including critical care time)  Medications Ordered in UC Medications - No data to display  Initial Impression / Assessment and Plan / UC Course  I have reviewed the triage vital signs and the nursing notes.  Pertinent labs & imaging results that were available during my care of the patient were reviewed by me and considered in my medical decision making (see chart for details).   Patient is a pleasant, nontoxic-appearing 87 old female presenting for evaluation of right eye redness as outlined HPI above.  As you can see in image above, the redness is confined to the medial hemisphere of the right eye.  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  Her labral conjunctiva are unremarkable.  I suspect this is most likely allergy mediated, however, she has had symptoms for the last 3 days so I will cover her for potential infectious sources with Vigamox 3 times daily times a week.  I will also have her start using Opcon-A, 2 drops in her right eye every 6 hours, to help with itching.  If her symptoms do not improve she needs to follow-up with ophthalmology.  Final Clinical Impressions(s) / UC Diagnoses   Final diagnoses:  Eye redness     Discharge Instructions      As we discussed, it is possible that the eye redness  you are experiencing is a result of allergies given that there is some associated redness but no drainage.  However, we are going to treat you for potentially infectious source.  Instill 1 drop of Vigamox in your right eye eye every 8 hours for the next 7 days for treatment of your conjunctivitis.  I also want you to use Opcon-A and instill 2 drops in your right eye every 6 hours as needed for redness and itching.  Make sure there is 10 minutes between using the Vigamox and this antihistamine eyedrop.  Avoid touching your eyes as much as possible.  Wipe down all surfaces, countertops, and doorknobs after the first and second 24 hours on eyedrops.  Wash her face with a clean wash rag to remove any drainage and use a different portion of the wash rag to clean each eye so as to not reinfect yourself.  Return for reevaluation for any new or worsening symptoms.      ED Prescriptions     Medication Sig Dispense Auth. Provider   moxifloxacin (VIGAMOX) 0.5 % ophthalmic solution Place 1 drop into the right eye 3 (three) times daily for 7 days. 3 mL Becky Augusta, NP   Naphazoline-Pheniramine (OPCON-A) 0.027-0.315 % SOLN Apply 2 drops to eye every 6 (six) hours as needed. 15 mL Becky Augusta, NP      PDMP not reviewed this encounter.   Becky Augusta, NP 05/31/23 [id number]

## 2023-05-31 NOTE — ED Triage Notes (Signed)
 Patient presents with right eye redness x 3 days. Currently denies pain or drainage.

## 2023-10-08 ENCOUNTER — Encounter (HOSPITAL_COMMUNITY): Payer: Self-pay | Admitting: Interventional Radiology

## 2024-05-03 ENCOUNTER — Ambulatory Visit
Admission: EM | Admit: 2024-05-03 | Discharge: 2024-05-03 | Disposition: A | Discharge status: Home / Self Care | Source: Home / Self Care | Attending: Family Medicine | Admitting: Family Medicine

## 2024-05-03 ENCOUNTER — Ambulatory Visit

## 2024-05-03 DIAGNOSIS — R809 Proteinuria, unspecified: Secondary | ICD-10-CM | POA: Diagnosis not present

## 2024-05-03 DIAGNOSIS — M545 Low back pain, unspecified: Secondary | ICD-10-CM

## 2024-05-03 LAB — POCT URINE DIPSTICK
Bilirubin, UA: NEGATIVE
Blood, UA: NEGATIVE
Glucose, UA: NEGATIVE mg/dL
Ketones, POC UA: NEGATIVE mg/dL
Leukocytes, UA: NEGATIVE
Nitrite, UA: NEGATIVE
Protein Ur, POC: 30 mg/dL — AB
Spec Grav, UA: 1.01
Urobilinogen, UA: 0.2 U/dL
pH, UA: 6.5

## 2024-05-03 MED ORDER — CYCLOBENZAPRINE HCL 5 MG PO TABS: 5.0000 mg | ORAL_TABLET | Freq: Three times a day (TID) | ORAL | 0 refills | Status: AC | PRN

## 2024-05-03 MED ORDER — KETOROLAC TROMETHAMINE 60 MG/2ML IM SOLN
30.0000 mg | Freq: Once | INTRAMUSCULAR | Status: AC
  Administered 2024-05-03: 30 mg via INTRAMUSCULAR

## 2024-05-03 MED ORDER — NAPROXEN 500 MG PO TABS: 500.0000 mg | ORAL_TABLET | Freq: Two times a day (BID) | ORAL | 0 refills | Status: AC

## 2024-05-03 NOTE — ED Provider Notes (Incomplete)
 " [facility]    CSN: [id number] Arrival date & time: 05/03/24  1051      History   Chief Complaint Chief Complaint  Patient presents with   Back Pain    HPI  HPI [name] is a 49 y.o. female.  History provided by patient and her daughters. 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      [name] presents for constant sharp low back pain that started 4-5 days ago without known injury. Pain is described as pressure and sharp and does  not radiate. Pain rated 8/10.  Denies weakness, numbness, tingling, dysuria, leg pain, leg weakness, abdominal pain, incontinence, pelvic pain, perianal numbness.  No history of kidney stones.  Has history of low back pain.  Denies any heavy lifting recently.  Has been taken Tylenol  without relief    Past Medical History:  Diagnosis Date   Anemia    history of goiter    Hypertension    Low back pain    Stroke [facility])     Patient Active Problem List   Diagnosis Date Noted   Aphasia, late effect of cerebrovascular disease 03/05/2021   Apraxia, late effect of cerebrovascular disease 03/05/2021   Sleep disturbance 03/05/2021   Acute on chronic renal failure 03/05/2021   Slow transit constipation    Iron  deficiency anemia    Benign essential HTN    Acute ischemic left middle cerebral artery (MCA) stroke (HCC) 02/21/2021   Stroke (cerebrum) (HCC) 02/16/2021   Middle cerebral artery embolism, left 02/16/2021   Endotracheally intubated    Hypertensive urgency 09/29/2020   History of ischemic left MCA stroke 09/29/2020   Basal ganglia stroke (HCC) 09/29/2020   Malignant hypertension 09/29/2020   Obesity, Class III, BMI 40-49.9 (morbid obesity) (HCC) 09/29/2020   Hyperlipidemia, mixed 09/29/2020    Past Surgical History:  Procedure Laterality Date   CESAREAN SECTION     IR CT HEAD LTD  02/16/2021   IR CT HEAD LTD  02/16/2021   IR CT HEAD LTD  02/16/2021   IR CT HEAD LTD  02/16/2021   IR  PERCUTANEOUS ART THROMBECTOMY/INFUSION INTRACRANIAL INC DIAG ANGIO  02/16/2021   RADIOLOGY WITH ANESTHESIA N/A 02/16/2021   Procedure: IR WITH ANESTHESIA;  Surgeon: Dolphus Carrion, MD;  Location: [facility] OR;  Service: Radiology;  Laterality: N/A;   THYROIDECTOMY      OB History   No obstetric history on file.      Home Medications    Prior to Admission medications  Medication Sig Start Date End Date Taking? Authorizing Provider  amLODipine  (NORVASC ) 10 MG tablet Take 1 tablet (10 mg total) by mouth daily. 03/05/21  Yes Love, Sharlet RAMAN, PA-C  cyclobenzaprine  (FLEXERIL ) 5 MG tablet Take 1 tablet (5 mg total) by mouth 3 (three) times daily as needed. 05/03/24  Yes Jessalyn Hinojosa, DO  ferrous gluconate  (FERGON) 324 MG tablet Take 1 tablet (324 mg total) by mouth 3 (three) times daily with meals. 03/05/21  Yes Love, Sharlet RAMAN, PA-C  lamoTRIgine (LAMICTAL) 100 MG tablet Take 100 mg by mouth daily.   Yes [provider]  naproxen  (NAPROSYN ) 500 MG tablet Take 1 tablet (500 mg total) by mouth 2 (two) times daily with a meal. 05/03/24  Yes Larnie Heart, DO  QUEtiapine  (SEROQUEL ) 25 MG tablet Take 1 tablet (25 mg total) by mouth at bedtime. 03/05/21  Yes Love, Sharlet RAMAN, PA-C  acetaminophen  (TYLENOL ) 325 MG tablet Take 1-2 tablets (325-650  mg total) by mouth every 4 (four) hours as needed for mild pain. 03/05/21   Love, Sharlet RAMAN, PA-C  carvedilol (COREG) 12.5 MG tablet Take by mouth. Patient not taking: Reported on 05/03/2024 09/12/21 09/12/22  [provider]  clopidogrel  (PLAVIX ) 75 MG tablet Take 1 tablet (75 mg total) by mouth daily. Patient not taking: Reported on 05/03/2024 03/05/21   Maurice Sharlet RAMAN, PA-C  ezetimibe (ZETIA) 10 MG tablet Take 1 tablet by mouth daily. 06/19/22 06/19/23  [provider]  furosemide (LASIX) 20 MG tablet Take by mouth. Patient not taking: Reported on 05/03/2024 12/30/21   [provider]  levETIRAcetam (KEPPRA) 750 MG tablet Take 750 mg by  mouth 2 (two) times daily. Patient not taking: Reported on 05/03/2024 08/30/21   [provider]  lisinopril  (ZESTRIL ) 20 MG tablet Take 20 mg by mouth daily. Patient not taking: Reported on 05/03/2024 05/01/21   [provider]  losartan-hydrochlorothiazide  (HYZAAR) 100-12.5 MG tablet Take 1 tablet by mouth daily. Patient not taking: Reported on 05/03/2024    [provider]  melatonin 3 MG TABS tablet Take 1 tablet (3 mg total) by mouth at bedtime. 03/05/21   Love, Sharlet RAMAN, PA-C  metoprolol  tartrate (LOPRESSOR ) 50 MG tablet Take 50 mg by mouth 2 (two) times daily.    [provider]  Naphazoline-Pheniramine (OPCON -A) 0.027-0.315 % SOLN Apply 2 drops to eye every 6 (six) hours as needed. 05/31/23   Bernardino Ditch, NP  pantoprazole  (PROTONIX ) 40 MG tablet Take by mouth. Patient not taking: Reported on 05/03/2024 03/05/21   [provider]  polyethylene glycol (MIRALAX  / GLYCOLAX ) 17 g packet Take 17 g by mouth 2 (two) times daily. 03/05/21   Love, Sharlet RAMAN, PA-C  rosuvastatin (CRESTOR) 40 MG tablet Take by mouth. 09/12/21 09/12/22  [provider]    Family History Family History  Problem Relation Age of Onset   Healthy Mother     Social History Social History[1]   Allergies   Labetalol  hcl   Review of Systems Review of Systems: egative unless otherwise stated in HPI.      Physical Exam Triage Vital Signs ED Triage Vitals  Encounter Vitals Group     BP 05/03/24 1109 (!) 166/92     Girls Systolic BP Percentile --      Girls Diastolic BP Percentile --      Boys Systolic BP Percentile --      Boys Diastolic BP Percentile --      Pulse Rate 05/03/24 1109 71     Resp 05/03/24 1109 17     Temp 05/03/24 1109 98 F (36.7 C)     Temp src --      SpO2 05/03/24 1109 96 %     Weight 05/03/24 1105 (!) 301 lb (136.5 kg)     Height --      Head Circumference --      Peak Flow --      Pain Score 05/03/24 1105 10     Pain Loc --       Pain Education --      Exclude from Growth Chart --    No data found.  Updated Vital Signs BP (!) 168/95   Pulse 70   Temp 98 F (36.7 C)   Resp 16   Wt (!) 136.5 kg   LMP 04/08/2024 Comment: denies preg. signed waiver  SpO2 98%   BMI 50.09 kg/m   Visual Acuity Right Eye Distance:  Left Eye Distance:   Bilateral Distance:    Right Eye Near:   Left Eye Near:    Bilateral Near:     Physical Exam GEN: well appearing female in no acute distress  CVS: well perfused  RESP: speaking in full sentences without pause, no respiratory distress  MSK:  Thoracic and Lumbar Spine: - Inspection: no gross deformity or asymmetry, swelling or ecchymosis. No skin changes  - Palpation: +lower lumbar TTP over the lumbar spinous processes, +bilateral lumbar paraspinal muscle tenderness and hypertonicity, no SI joint tenderness bilaterally - ROM: limited active ROM of the lumbar spine in flexion and extension, due to acute pain  - Strength: 5/5 strength of lower extremity in L4-S1 nerve root distributions b/l - Neuro: sensation intact in the L4-S1 nerve root distribution b/l - Special testing: Negative straight leg raise SKIN: warm, dry, no overly skin rash or erythema    UC Treatments / Results  Labs (all labs ordered are listed, but only abnormal results are displayed) Labs Reviewed  POCT URINE DIPSTICK - Abnormal; Notable for the following components:      Result Value   Protein Ur, POC =30 (*)    All other components within normal limits    EKG   Radiology DG Lumbar Spine Complete Result Date: 05/03/2024 CLINICAL DATA:  Low back pain. EXAM: LUMBAR SPINE - COMPLETE 4+ VIEW COMPARISON:  None Available. FINDINGS: The L5 appears sacralized. There is no acute fracture or subluxation of the lumbar spine. Mild multilevel degenerative changes and spurring. Mild lower lumbar facet arthropathy. Moderate bilateral hip arthritic changes. The soft tissues are unremarkable. IMPRESSION: 1. No  acute findings. 2. Mild multilevel degenerative changes. Electronically Signed   By: Vanetta Chou M.D.   On: 05/03/2024 12:23     Procedures Procedures (including critical care time)  Medications Ordered in UC Medications  ketorolac  (TORADOL ) injection 30 mg (30 mg Intramuscular Given 05/03/24 1131)    Initial Impression / Assessment and Plan / UC Course  I have reviewed the triage vital signs and the nursing notes.  Pertinent labs & imaging results that were available during my care of the patient were reviewed by me and considered in my medical decision making (see chart for details).      Pt is a 49 y.o.  female with 5 days of low back pain after bending over.  She is hypertensive here at 168/95.  I suspect this is complicated by pain.  She was given Toradol  30 mg IM.Obtained lumbar plain films.  Xray personally interpreted by me were unremarkable for fracture, or significant malalignment.  Radiologist report reviewed and notes multilevel degenerative changes.  Urinalysis obtained did not show any concern for urinary tract infection or hematuria to suggest a kidney stone.  She did have some proteinuria but on chart review this is not new.  Patient to gradually return to normal activities, as tolerated and continue ordinary activities within the limits permitted by pain. Prescribed Naproxen  sodium  and muscle relaxer  for pain relief.  Advised patient to avoid other NSAIDs while taking Naprosyn . Tylenol  and Lidocaine  patches PRN for multimodal pain relief. Counseled patient on red flag symptoms and when to seek immediate care.  No red flags suggesting cauda equina syndrome or progressive major motor weakness. Patient to follow up with orthopedic provider if symptoms do not improve with conservative treatment.  Return and ED precautions given.    Discussed MDM, treatment plan and plan for follow-up with patient and her daughter who  agree with plan.   Final Clinical Impressions(s) / UC  Diagnoses   Final diagnoses:  Acute bilateral low back pain without sciatica  Proteinuria, unspecified type     Discharge Instructions      Your urine didn't show any blood or concern for infection.  You did have some slight protein in your urine. On review of your old urine samples, this is not new.   Follow up with your primary care provider to discuss if further workup is needed for this.   Your back x-ray showed some age-related changes but no significant alignment fractures.  Take the muscle relaxer at bedtime and then as needed throughout the day.  You can take the Naprosyn  twice a day as needed for pain with the 2 tablets of Tylenol  every 6-8 hours.      ED Prescriptions     Medication Sig Dispense Auth. Provider   naproxen  (NAPROSYN ) 500 MG tablet Take 1 tablet (500 mg total) by mouth 2 (two) times daily with a meal. 30 tablet Augustine Brannick, DO   cyclobenzaprine  (FLEXERIL ) 5 MG tablet Take 1 tablet (5 mg total) by mouth 3 (three) times daily as needed. 30 tablet Deldrick Linch, DO      PDMP not reviewed this encounter.        [1]  Social History Tobacco Use   Smoking status: Never    Passive exposure: Past   Smokeless tobacco: Never  Vaping Use   Vaping status: Never Used  Substance Use Topics   Alcohol use: Not Currently    Comment: occassionally   Drug use: Not Currently    Types: Marijuana    Comment: occassionally     Kriste Berth, DO 05/05/24 1720  "

## 2024-05-03 NOTE — Discharge Instructions (Addendum)
 Your urine didn't show any blood or concern for infection.  You did have some slight protein in your urine. On review of your old urine samples, this is not new.   Follow up with your primary care provider to discuss if further workup is needed for this.   Your back x-ray showed some age-related changes but no significant alignment fractures.  Take the muscle relaxer at bedtime and then as needed throughout the day.  You can take the Naprosyn  twice a day as needed for pain with the 2 tablets of Tylenol  every 6-8 hours.

## 2024-05-03 NOTE — ED Triage Notes (Signed)
 Patient to Urgent Care with complaints of lower back pain. No radiation. No known injury.   Symptoms x4-5 days.   Taking tylenol  w/o any relief.
